# Patient Record
Sex: Male | Born: 1955
Health system: Southern US, Community
[De-identification: ages and names within clinical notes are randomized; demographics above are authoritative.]

## PROBLEM LIST (undated history)

## (undated) DIAGNOSIS — R7303 Prediabetes: Secondary | ICD-10-CM

## (undated) DIAGNOSIS — K635 Polyp of colon: Secondary | ICD-10-CM

## (undated) DIAGNOSIS — F419 Anxiety disorder, unspecified: Secondary | ICD-10-CM

## (undated) DIAGNOSIS — E790 Hyperuricemia without signs of inflammatory arthritis and tophaceous disease: Secondary | ICD-10-CM

## (undated) DIAGNOSIS — E785 Hyperlipidemia, unspecified: Secondary | ICD-10-CM

## (undated) DIAGNOSIS — I10 Essential (primary) hypertension: Secondary | ICD-10-CM

## (undated) DIAGNOSIS — C61 Malignant neoplasm of prostate: Secondary | ICD-10-CM

## (undated) DIAGNOSIS — R351 Nocturia: Secondary | ICD-10-CM

## (undated) HISTORY — DX: Hyperlipidemia, unspecified: E78.5

## (undated) HISTORY — DX: Hyperuricemia without signs of inflammatory arthritis and tophaceous disease: E79.0

## (undated) HISTORY — DX: Polyp of colon: K63.5

## (undated) HISTORY — DX: Anxiety disorder, unspecified: F41.9

---

## 2006-02-10 LAB — HM COLONOSCOPY

## 2011-03-05 ENCOUNTER — Ambulatory Visit (INDEPENDENT_AMBULATORY_CARE_PROVIDER_SITE_OTHER): Payer: BC Managed Care – PPO | Admitting: Family Medicine

## 2011-03-05 ENCOUNTER — Encounter: Payer: Self-pay | Admitting: Family Medicine

## 2011-03-05 VITALS — BP 152/100 | HR 88 | Ht 70.0 in | Wt 179.0 lb

## 2011-03-05 DIAGNOSIS — E7800 Pure hypercholesterolemia, unspecified: Secondary | ICD-10-CM

## 2011-03-05 DIAGNOSIS — B354 Tinea corporis: Secondary | ICD-10-CM

## 2011-03-05 DIAGNOSIS — D126 Benign neoplasm of colon, unspecified: Secondary | ICD-10-CM

## 2011-03-05 DIAGNOSIS — Z0000 Encounter for general adult medical examination without abnormal findings: Secondary | ICD-10-CM

## 2011-03-05 DIAGNOSIS — R030 Elevated blood-pressure reading, without diagnosis of hypertension: Secondary | ICD-10-CM

## 2011-03-05 LAB — COMPREHENSIVE METABOLIC PANEL
ALT: 39 U/L (ref 0–53)
Albumin: 4.9 g/dL (ref 3.5–5.2)
CO2: 25 mEq/L (ref 19–32)
Calcium: 9.9 mg/dL (ref 8.4–10.5)
Chloride: 101 mEq/L (ref 96–112)
Sodium: 141 mEq/L (ref 135–145)
Total Protein: 7.3 g/dL (ref 6.0–8.3)

## 2011-03-05 LAB — CBC
Platelets: 285 10*3/uL (ref 150–400)
RDW: 12.8 % (ref 11.5–15.5)
WBC: 8.1 10*3/uL (ref 4.0–10.5)

## 2011-03-05 LAB — POCT URINALYSIS DIPSTICK
Glucose, UA: NEGATIVE
Nitrite, UA: NEGATIVE
Urobilinogen, UA: NEGATIVE

## 2011-03-05 LAB — LIPID PANEL
LDL Cholesterol: 204 mg/dL — ABNORMAL HIGH (ref 0–99)
Triglycerides: 139 mg/dL (ref <150)

## 2011-03-05 NOTE — Patient Instructions (Signed)
Ringworm - Body (Tinea Corporis) Ringworm is a fungal infection of the skin and hair. Another name for this problem is Tinea Corporis. It has nothing to do with worms. A fungus is an organism that lives on dead cells (the outer layer of skin). It can involve the entire body. It can spread from infected pets. Tinea corporis can be a problem in wrestlers who may get the infection form other players/opponents, equipment and mats. DIAGNOSIS A skin scraping can be obtained from the affected area and by looking for fungus under the microscope. This is called a KOH examination.  HOME CARE INSTRUCTIONS  Ringworm may be treated with a topical antifungal cream, ointment, or oral medications.   If you are using a cream or ointment, wash infected skin. Dry it completely before application.   Scrub the skin with a buff puff or abrasive sponge using a shampoo with ketoconazole to remove dead skin and help treat the ringworm.   Have your pet treated by your veterinarian if it has the same infection.  SEEK MEDICAL CARE IF:  The ringworm patch (fungus) continues to spread after 7 days of treatment.   The rash is not gone in 4 weeks. Fungal infections are slow to respond to treatment. Some redness (erythema) may remain for several weeks after the fungus is gone.   The area becomes red, warm, tender, and swollen beyond the patch. This may be a secondary bacterial (germ) infection.   An oral temperature above 101 develops.

## 2011-03-05 NOTE — Progress Notes (Signed)
Jeremy Bush is a 55 y.o. male who presents for a complete physical.  He has the following concerns: cholesterol.  He has a h/o high cholesterol, was started on Zocor 20mg  in 2007.  Took it until it ran out, and never followed up.  Denies side effects.  Following a low cholesterol diet.  Admits to a very high stress job.  Drinks about 3 glass of wine every day.  Gets to the [employer] sporadically (weekends), no exercise during the week.  H/o elevated PSA at last CPE in '07. Sounds like he was treated for a prostatitis by Dr. Wanda Plump and hasn't had any symptoms.  Hasn't had PSA checked since that time.  Denies urinary symptoms.   Immunization History  Administered Date(s) Administered  . Td 09/15/2005   Last colonoscopy: 2007, just received notice that he is due again Last PSA:  2/07, 1.91.  Had elevated PSA, was referred to Dr. Wanda Plump.  Treated with a medication (?antibiotic), and PSA improved.  Not checked since ophtho annually, dentist 3x/year  The patient denies anorexia, fever, weight changes, headaches,  vision loss, decreased hearing, ear pain, hoarseness, chest pain, palpitations, dizziness, syncope, dyspnea on exertion, cough, swelling, nausea, vomiting, diarrhea, constipation, abdominal pain, melena, hematochezia, indigestion/heartburn, hematuria, incontinence, erectile dysfunction, nocturia, weakened urine stream, dysuria, genital lesions, joint pains, numbness, tingling, weakness, tremor, suspicious skin lesions, depression, anxiety, abnormal bleeding/bruising, or enlarged lymph nodes  ROS notable for mild sore throat x few days, up once a night to bathroom. Red spots on back of right thigh--noticed about a month ago.  Not itchy.  Has a cat  Past Medical History  Diagnosis Date  . Hyperlipidemia   . Colonic polyp 2007    History reviewed. No pertinent past surgical history.  History   Social History  . Marital Status: Married    Spouse Name: N/A    Number of  Children: N/A  . Years of Education: N/A   Occupational History  . Not on file.   Social History Main Topics  . Smoking status: Former Smoker    Types: Cigarettes    Quit date: 10/27/1978  . Smokeless tobacco: Not on file  . Alcohol Use: 12.6 oz/week    21 Glasses of wine per week     daily 2-3 drinks per day prior to dinner  . Drug Use: No  . Sexually Active: Yes   Other Topics Concern  . Not on file   Social History Narrative  . No narrative on file    Family History  Problem Relation Age of Onset  . Cancer Mother     lung (smoker)  . Hypertension Mother   . Hyperlipidemia Mother   . Stroke Father     mini-stroke  . Hypertension Father   . Kidney disease Father     on dialysis  . Diabetes Father     Current outpatient prescriptions:chlorpheniramine-pseudoephedrine-acetaminophen (SINUTAB) 2-30-500 MG per tablet, Take 1 tablet by mouth as needed.  , Disp: , Rfl: ;  Doxylamine Succinate, Sleep, (UNISOM PO), Take 1 capsule by mouth.  , Disp: , Rfl:   No Known Allergies  Physical Exam: BP 152/100  Pulse 88  Ht 5\' 10"  (1.778 m)  Wt 179 lb (81.194 kg)  BMI 25.68 kg/m2  General Appearance:    Alert, cooperative, no distress, appears stated age  Head:    Normocephalic, without obvious abnormality, atraumatic  Eyes:    PERRL, conjunctiva/corneas clear, EOM's intact, fundi    benign  Ears:  Normal TM's and external ear canals  Nose:   Nares normal, mucosa normal, no drainage or sinus   tenderness  Throat:   Lips, mucosa, and tongue normal; teeth and gums normal  Neck:   Supple, no lymphadenopathy;  thyroid:  no   enlargement/tenderness/nodules; no carotid   bruit or JVD  Back:    Spine nontender, no curvature, ROM normal, no CVA     tenderness  Lungs:     Clear to auscultation bilaterally without wheezes, rales or     ronchi; respirations unlabored  Chest Wall:    No tenderness or deformity   Heart:    Regular rate and rhythm, S1 and S2 normal, no murmur, rub    or gallop  Breast Exam:    No chest wall tenderness, masses or gynecomastia  Abdomen:     Soft, non-tender, nondistended, normoactive bowel sounds,    no masses, no hepatosplenomegaly  Genitalia:    Normal male external genitalia without lesions.  Testicles without masses.  No inguinal hernias.  Rectal:    Normal sphincter tone, no masses or tenderness; guaiac negative stool.  Prostate smooth, no nodules, not enlarged.  Extremities:   No clubbing, cyanosis or edema  Pulses:   2+ and symmetric all extremities  Skin:   Skin color, texture, turgor normal, R buttock has circular lesions x 2, raised edges with clear center  Lymph nodes:   Cervical, supraclavicular, and axillary nodes normal  Neurologic:   CNII-XII intact, normal strength, sensation and gait; reflexes 2+ and symmetric throughout          Psych:   Normal mood, affect, hygiene and grooming.    1. Routine general medical examination at a health care facility  POCT urinalysis dipstick, Comprehensive metabolic panel, CBC, PSA  2. Pure hypercholesterolemia  Lipid panel  3. Elevated BP  Comprehensive metabolic panel  4. Tinea corporis     discussed use of OTC antifungal (such as clotrimazole cream) twice daily for up to 2-3 weeks.  Follow up if not improving   Discussed PSA screening (risks/benefits), recommended at least 30 minutes of aerobic activity at least 5 days/week; proper sunscreen use reviewed; healthy diet and alcohol recommendations (less than or equal to 2 drinks/day) reviewed; regular seatbelt use; changing batteries in smoke detectors. Self-testicular exams. Immunization recommendations discussed.  Colonoscopy recommendations reviewed.

## 2011-03-06 ENCOUNTER — Telehealth: Payer: Self-pay | Admitting: *Deleted

## 2011-03-06 NOTE — Telephone Encounter (Signed)
Message copied by Debbrah Alar on Thu Mar 06, 2011 10:52 AM ------      Message from: KNAPP, EVE      Created: Thu Mar 06, 2011  9:33 AM       Advise patient of lipid results--higher than last time (last LDL before meds was 190).  I recommend starting 20mg  Lipitor (rather than the Zocor that he was put on last time).  We need to recheck LFTs and lipid panel in 2 months, and may need to adjust the dose based on those values (dx 272.0, v58.69).  Rx for the generic #30 with 2 refills and schedule lab visit.  Chem panel normal except sugar was 102, normal <100.  Daily exercise will help keep sugars down. CBC okay. PSA was slightly elevated at 4.63.  I recommend that he return for a FREE PSA. If he is unable to return for this lab in the near future, then we can do it in 2 months with his lipids. ( Diagnosis is elevated PSA)

## 2011-03-06 NOTE — Telephone Encounter (Signed)
Left message for patient to return my call re:lab results

## 2011-03-10 ENCOUNTER — Other Ambulatory Visit: Payer: Self-pay | Admitting: *Deleted

## 2011-03-10 DIAGNOSIS — E785 Hyperlipidemia, unspecified: Secondary | ICD-10-CM

## 2011-03-10 MED ORDER — ATORVASTATIN CALCIUM 20 MG PO TABS: 20.0000 mg | ORAL_TABLET | Freq: Every day | ORAL | Status: DC

## 2011-03-10 NOTE — Telephone Encounter (Signed)
Left message for pateint to return my call [id number]

## 2011-03-14 ENCOUNTER — Telehealth: Payer: Self-pay | Admitting: Family Medicine

## 2011-03-14 NOTE — Telephone Encounter (Signed)
Pt started Lipitor on Monday, takes in evenings, causing fast pulse, can't sleep, upset stomach, dull aching lower back. Just feels bad

## 2011-03-14 NOTE — Telephone Encounter (Signed)
jcl had pt to stop med over the weekend and call Monday to discuss with Dr.knapp

## 2011-03-14 NOTE — Telephone Encounter (Signed)
Have him stop the Lipitor through the weekend and call on Monday to discuss this further with Dr. Lynelle Doctor

## 2011-04-17 ENCOUNTER — Encounter: Payer: Self-pay | Admitting: Family Medicine

## 2011-05-13 ENCOUNTER — Encounter: Payer: Self-pay | Admitting: Family Medicine

## 2011-05-14 ENCOUNTER — Ambulatory Visit: Payer: BC Managed Care – PPO

## 2011-05-21 ENCOUNTER — Ambulatory Visit: Payer: BC Managed Care – PPO

## 2011-05-21 DIAGNOSIS — Z79899 Other long term (current) drug therapy: Secondary | ICD-10-CM

## 2011-05-21 DIAGNOSIS — R972 Elevated prostate specific antigen [PSA]: Secondary | ICD-10-CM

## 2011-05-21 DIAGNOSIS — E7800 Pure hypercholesterolemia, unspecified: Secondary | ICD-10-CM

## 2011-05-21 LAB — LIPID PANEL
Cholesterol: 300 mg/dL — ABNORMAL HIGH (ref 0–200)
HDL: 61 mg/dL (ref >39)
Total CHOL/HDL Ratio: 4.9 Ratio
VLDL: 25 mg/dL (ref 0–40)

## 2011-05-21 LAB — HEPATIC FUNCTION PANEL
Albumin: 4.5 g/dL (ref 3.5–5.2)
Alkaline Phosphatase: 63 U/L (ref 39–117)
Bilirubin, Direct: 0.2 mg/dL (ref 0.0–0.3)
Total Bilirubin: 1 mg/dL (ref 0.3–1.2)

## 2011-05-22 ENCOUNTER — Telehealth: Payer: Self-pay

## 2011-05-22 LAB — PSA, TOTAL AND FREE: PSA: 3.51 ng/mL (ref <=4.00)

## 2011-05-22 NOTE — Telephone Encounter (Signed)
Called pt left message for him to call and make appt to see Dr.Knapp pt agreed and made appt

## 2011-05-23 NOTE — Progress Notes (Signed)
dt ?

## 2011-05-28 ENCOUNTER — Encounter: Payer: Self-pay | Admitting: Family Medicine

## 2011-05-28 ENCOUNTER — Ambulatory Visit (INDEPENDENT_AMBULATORY_CARE_PROVIDER_SITE_OTHER): Payer: BC Managed Care – PPO | Admitting: Family Medicine

## 2011-05-28 DIAGNOSIS — E7800 Pure hypercholesterolemia, unspecified: Secondary | ICD-10-CM

## 2011-05-28 DIAGNOSIS — R972 Elevated prostate specific antigen [PSA]: Secondary | ICD-10-CM

## 2011-05-28 DIAGNOSIS — I10 Essential (primary) hypertension: Secondary | ICD-10-CM | POA: Insufficient documentation

## 2011-05-28 MED ORDER — LISINOPRIL-HYDROCHLOROTHIAZIDE 10-12.5 MG PO TABS: 1.0000 | ORAL_TABLET | Freq: Every day | ORAL | Status: DC

## 2011-05-28 NOTE — Patient Instructions (Addendum)
Please CALL if you aren't tolerating the Lipitor, so we can change you to Crestor. Continue to monitor blood pressure. If BP drops below 100/60, or if you're feeling dizzy, then cut medication in half. Please fax blood pressure list in 2-3 weeks.    Try clotrimazole if your current antifungal isn't effective (OTC)

## 2011-05-28 NOTE — Progress Notes (Signed)
Patient presents for f/u hyperlipidemia.  He was started on Lipitor 20mg .  He noticed side effects after about the third day--had palpitations, and woke him up from sleeping.  Never tried taking it during the day. He called here, was told to stop med, and call back to speak with me, but he never called back.  He continues to try and follow a low cholesterol diet.  He is also here to f/u on prostate labs.  His PSA was >4 at last check, so it was repeated with free PSA.  Repeat was <4 (see lab results).  Denies any prostate symptoms currently.  He has a h/o prostatitis, treated with ABX, and was under the care of urologist.  Follow up elevated blood pressure.  He has been checking BP at pharmacies and at parent's house (forgot to bring list).  On average BP's high 130's/high 90's. Only time he gets a good reading is after exercise, but higher in the evenings.  120/80 after a vacation, but recurred up to 130's/97 after a few days back at work. Exercises most days of the week, and follows low salt diet.  Past Medical History  Diagnosis Date  . Hyperlipidemia   . Colonic polyp 2007    History reviewed. No pertinent past surgical history.  History   Social History  . Marital Status: Married    Spouse Name: N/A    Number of Children: N/A  . Years of Education: N/A   Occupational History  . Not on file.   Social History Main Topics  . Smoking status: Former Smoker    Types: Cigarettes    Quit date: 10/27/1978  . Smokeless tobacco: Not on file  . Alcohol Use: 12.6 oz/week    21 Glasses of wine per week     daily 2-3 drinks per day prior to dinner  . Drug Use: No  . Sexually Active: Yes     Married with 3 children   Other Topics Concern  . Not on file   Social History Narrative  . No narrative on file    Family History  Problem Relation Age of Onset  . Cancer Mother     lung (smoker)  . Hypertension Mother   . Hyperlipidemia Mother   . Stroke Father     mini-stroke  .  Hypertension Father   . Kidney disease Father     on dialysis  . Diabetes Father    Current Outpatient Prescriptions on File Prior to Visit  Medication Sig Dispense Refill  . chlorpheniramine-pseudoephedrine-acetaminophen (SINUTAB) 2-30-500 MG per tablet Take 1 tablet by mouth as needed.        . Doxylamine Succinate, Sleep, (UNISOM PO) Take 1 capsule by mouth.        Marland Kitchen atorvastatin (LIPITOR) 20 MG tablet Take 1 tablet (20 mg total) by mouth daily.  30 tablet  2  (not taking Lipitor)  No Known Allergies  ROS:  Denies fevers, headaches, chest pain, SOB, URI symptoms, urinary symptoms, myalgias, arthralgias, GI complaints, or other problems. He states that ringworm on thigh resolved with OTC antifungal (generic Tinactin, I believe), but has noticed areas on buttocks--seem to be responding to use of antifungal (been using x 2 weeks)  PHYSICAL EXAM: BP 160/110  Pulse 68  Ht 5\' 10"  (1.778 m)  Wt 175 lb (79.379 kg)  BMI 25.11 kg/m2 Well developed, pleasant, non-anxious appearing male, in no distress Neck: no lymphadenopathy thyromegaly or mass Heart: regular rate and rhythm without murmur Lungs: clear  bilaterally Extremities: no edema Psych: normal mood ,affect, hygiene and grooming  ASSESSMENT/PLAN: 1. Pure hypercholesterolemia  Lipid panel, Hepatic function panel  2. Abnormal PSA  PSA, total and free  3. Essential hypertension, benign  lisinopril-hydrochlorothiazide (ZESTORETIC) 10-12.5 MG per tablet   Discussed re-trying Lipitor during the day (can start at 1/2 tablet for a few days if he wants).  If doesn't tolerate, call and change to Crestor (20mg )  Discussed 27% risk of cancer based on free PSA. H/o elevated PSA due to prostatitis.  Has seen urologist in past, PSA decreased to <2 with prolonged ABX.  Offered referral back to urologist, vs re-check PSA (and free PSA) in 6 months.  Prefers to re-check in 6 months, but will follow up if develops any prostate symptoms (ie  prostatitis)  HTN:  Partly related to stressful job.  Continue with exercise, low sodium diet.  Understands need to start meds.  Risks and side effects of ACEI reviewed.  Continue to check BP's elsewhere, and pt to fax BP results in 2-3 weeks

## 2011-07-09 ENCOUNTER — Other Ambulatory Visit: Payer: BC Managed Care – PPO

## 2011-07-10 ENCOUNTER — Other Ambulatory Visit: Payer: BC Managed Care – PPO

## 2011-07-11 ENCOUNTER — Other Ambulatory Visit: Payer: BC Managed Care – PPO

## 2011-07-11 DIAGNOSIS — R972 Elevated prostate specific antigen [PSA]: Secondary | ICD-10-CM

## 2011-07-11 DIAGNOSIS — E7800 Pure hypercholesterolemia, unspecified: Secondary | ICD-10-CM

## 2011-07-11 DIAGNOSIS — I10 Essential (primary) hypertension: Secondary | ICD-10-CM

## 2011-07-11 LAB — COMPREHENSIVE METABOLIC PANEL
ALT: 57 U/L — ABNORMAL HIGH (ref 0–53)
AST: 31 U/L (ref 0–37)
Albumin: 4.1 g/dL (ref 3.5–5.2)
Alkaline Phosphatase: 62 U/L (ref 39–117)
BUN: 15 mg/dL (ref 6–23)
Calcium: 9.2 mg/dL (ref 8.4–10.5)
Chloride: 105 mEq/L (ref 96–112)
Potassium: 4.3 mEq/L (ref 3.5–5.3)
Sodium: 141 mEq/L (ref 135–145)
Total Protein: 6.5 g/dL (ref 6.0–8.3)

## 2011-07-11 LAB — LIPID PANEL
Cholesterol: 203 mg/dL — ABNORMAL HIGH (ref 0–200)
Total CHOL/HDL Ratio: 3.9 Ratio

## 2011-07-11 LAB — PSA, TOTAL AND FREE
PSA, Free Pct: 17 % — ABNORMAL LOW (ref >25)
PSA, Free: 0.64 ng/mL
PSA: 3.66 ng/mL (ref <=4.00)

## 2011-07-16 ENCOUNTER — Other Ambulatory Visit: Payer: Self-pay | Admitting: *Deleted

## 2011-07-16 ENCOUNTER — Telehealth: Payer: Self-pay | Admitting: *Deleted

## 2011-07-16 DIAGNOSIS — R972 Elevated prostate specific antigen [PSA]: Secondary | ICD-10-CM

## 2011-07-16 DIAGNOSIS — Z79899 Other long term (current) drug therapy: Secondary | ICD-10-CM

## 2011-07-16 NOTE — Telephone Encounter (Signed)
Spoke with patient, lab results given. Patient to continue on lipitor and low cholesterol diet. Patient will send Dr.Knapp blood pressure readings in the next 2 weeks. He did want Dr.Knapp to know that in the am before he takes his meds his readings are 130/90, then in the afternoon/early evening his readings are 110/70 and the highest 120/80, but he will do some logs and send. He is scheduled for January 12, 2012 @ 8:30am for PSA, Free PSA, Lipids and CMET.

## 2011-07-22 ENCOUNTER — Ambulatory Visit (INDEPENDENT_AMBULATORY_CARE_PROVIDER_SITE_OTHER): Payer: BC Managed Care – PPO | Admitting: Family Medicine

## 2011-07-22 VITALS — BP 126/90 | HR 96 | Wt 185.0 lb

## 2011-07-22 DIAGNOSIS — L309 Dermatitis, unspecified: Secondary | ICD-10-CM

## 2011-07-22 DIAGNOSIS — Z23 Encounter for immunization: Secondary | ICD-10-CM

## 2011-07-22 DIAGNOSIS — L259 Unspecified contact dermatitis, unspecified cause: Secondary | ICD-10-CM

## 2011-07-22 NOTE — Progress Notes (Signed)
  Subjective:    Patient ID: Jeremy Bush, male    DOB: 1956/02/01, 55 y.o.   MRN: [id number]  HPI He is here for evaluation of a rash that occurred in the buttock area. It is not pruritic, warm or tender. He has tried an antifungal medicine without success. He does not noticed it anywhere also on his body. He has not been exposed to any new soaps detergents or chemicals.   Review of Systems     Objective:   Physical Exam Alert and in no distress. Reddish brown lesions noted in the buttock area and somewhat of a symmetric type pattern      Assessment & Plan:  Dermatitis NOS Refer to dermatology. I will also give a flu shot.

## 2011-08-29 ENCOUNTER — Other Ambulatory Visit: Payer: Self-pay | Admitting: Family Medicine

## 2011-11-13 ENCOUNTER — Telehealth: Payer: Self-pay | Admitting: Family Medicine

## 2011-11-13 NOTE — Telephone Encounter (Signed)
FEE PAID 

## 2011-11-20 ENCOUNTER — Telehealth: Payer: Self-pay | Admitting: *Deleted

## 2011-11-20 NOTE — Telephone Encounter (Signed)
Called pt to let him know that kidney function slightly higher now than in Sept, encouraged him to drink plenty of fluids. Lipids are fine, liver tests slightly elevated but stable. Pt is scheduled for lab recheck in March, reminded him about appt.

## 2011-12-02 ENCOUNTER — Other Ambulatory Visit: Payer: BC Managed Care – PPO

## 2011-12-05 ENCOUNTER — Telehealth: Payer: Self-pay | Admitting: Family Medicine

## 2011-12-05 MED ORDER — ATORVASTATIN CALCIUM 20 MG PO TABS: 20.0000 mg | ORAL_TABLET | Freq: Every day | ORAL | Status: DC

## 2011-12-05 MED ORDER — LISINOPRIL-HYDROCHLOROTHIAZIDE 10-12.5 MG PO TABS: 1.0000 | ORAL_TABLET | Freq: Every day | ORAL | Status: DC

## 2011-12-05 NOTE — Telephone Encounter (Signed)
Sent med in 

## 2012-01-02 ENCOUNTER — Encounter: Payer: Self-pay | Admitting: Internal Medicine

## 2012-01-12 ENCOUNTER — Other Ambulatory Visit: Payer: BC Managed Care – PPO

## 2012-01-12 DIAGNOSIS — Z0279 Encounter for issue of other medical certificate: Secondary | ICD-10-CM

## 2012-01-12 DIAGNOSIS — Z79899 Other long term (current) drug therapy: Secondary | ICD-10-CM

## 2012-01-12 DIAGNOSIS — R972 Elevated prostate specific antigen [PSA]: Secondary | ICD-10-CM

## 2012-01-12 LAB — COMPREHENSIVE METABOLIC PANEL
Albumin: 4.5 g/dL (ref 3.5–5.2)
CO2: 27 mEq/L (ref 19–32)
Calcium: 9.6 mg/dL (ref 8.4–10.5)
Chloride: 99 mEq/L (ref 96–112)
Glucose, Bld: 100 mg/dL — ABNORMAL HIGH (ref 70–99)
Potassium: 4.5 mEq/L (ref 3.5–5.3)
Sodium: 137 mEq/L (ref 135–145)
Total Protein: 6.8 g/dL (ref 6.0–8.3)

## 2012-01-12 LAB — LIPID PANEL
Cholesterol: 217 mg/dL — ABNORMAL HIGH (ref 0–200)
LDL Cholesterol: 126 mg/dL — ABNORMAL HIGH (ref 0–99)
Triglycerides: 150 mg/dL — ABNORMAL HIGH (ref <150)

## 2012-01-19 ENCOUNTER — Encounter: Payer: Self-pay | Admitting: Family Medicine

## 2012-01-19 ENCOUNTER — Ambulatory Visit (INDEPENDENT_AMBULATORY_CARE_PROVIDER_SITE_OTHER): Payer: BC Managed Care – PPO | Admitting: Family Medicine

## 2012-01-19 VITALS — BP 138/92 | HR 72 | Ht 70.0 in | Wt 182.0 lb

## 2012-01-19 DIAGNOSIS — I10 Essential (primary) hypertension: Secondary | ICD-10-CM

## 2012-01-19 DIAGNOSIS — R972 Elevated prostate specific antigen [PSA]: Secondary | ICD-10-CM

## 2012-01-19 DIAGNOSIS — E7800 Pure hypercholesterolemia, unspecified: Secondary | ICD-10-CM

## 2012-01-19 NOTE — Patient Instructions (Signed)
Nurse visit to verify accuracy of machine. Continued low sodium diet, regular exercise. If diastolic BP truly remains >85 consistently, then try changing med dose to 20/12.5.  If mostly <85 (and often <80), then no change is needed.  Elevated PSA--please schedule follow up with [facility] (previously saw Dr. Boston Service)  Cholesterol is doing well on current medications.  Continue the same meds.

## 2012-01-19 NOTE — Progress Notes (Signed)
Patient presents for 6 month follow up on hypertension and hyperlipidemia.  Hypertension follow-up:  Blood pressures was 131/89 at home last night.  Diastolic is usually right around 90 (87-91).  Denies dizziness, headaches, chest pain, cough (only minor).  Denies side effects of medications. Exercise routine irregular due to recent travel (business trips).  He recalls that his BP's are much lower when he is on his regular exercise routine (120's/high 70's-low 80's).  Hyperlipidemia follow-up:  Patient is reportedly following a low-fat, low cholesterol diet--admits it is worse recently due to a lot of business travel.  Compliant with medications and denies medication side effects  Elevated PSA.  Previously saw Dr. Wanda Plump.  Slight diminished stream, mainly if sitting all day.  Up once a night to void.  H/o elevated PSA in past, treated for prostatitis and PSA's went back down.    Past Medical History  Diagnosis Date  . Hyperlipidemia   . Colonic polyp 2007    History reviewed. No pertinent past surgical history.  History   Social History  . Marital Status: Married    Spouse Name: N/A    Number of Children: N/A  . Years of Education: N/A   Occupational History  . Not on file.   Social History Main Topics  . Smoking status: Former Smoker    Types: Cigarettes    Quit date: 10/27/1978  . Smokeless tobacco: Not on file  . Alcohol Use: 12.6 oz/week    21 Glasses of wine per week     daily 2-3 drinks per day prior to dinner  . Drug Use: No  . Sexually Active: Yes     Married with 3 children   Other Topics Concern  . Not on file   Social History Narrative  . No narrative on file    Family History  Problem Relation Age of Onset  . Cancer Mother     lung (smoker)  . Hypertension Mother   . Hyperlipidemia Mother   . Stroke Father     mini-stroke  . Hypertension Father   . Kidney disease Father     on dialysis  . Diabetes Father    Current Outpatient Prescriptions on  File Prior to Visit  Medication Sig Dispense Refill  . atorvastatin (LIPITOR) 20 MG tablet Take 1 tablet (20 mg total) by mouth daily.  30 tablet  3  . Doxylamine Succinate, Sleep, (UNISOM PO) Take 1 capsule by mouth.        Marland Kitchen lisinopril-hydrochlorothiazide (PRINZIDE,ZESTORETIC) 10-12.5 MG per tablet Take 1 tablet by mouth daily.  30 tablet  prn    No Known Allergies  ROS:  Denies fevers, allergies, URI symptoms, cough, shortness of breath, chest pain, edema, skin rashes, nausea, vomiting, bowel changes, dysuria.   PHYSICAL EXAM: BP 138/92  Pulse 72  Ht 5\' 10"  (1.778 m)  Wt 182 lb (82.555 kg)  BMI 26.11 kg/m2 138/92 Well developed, pleasant, non-anxious appearing male, in no distress  Neck: no lymphadenopathy thyromegaly or mass  Heart: regular rate and rhythm without murmur  Lungs: clear bilaterally  Extremities: no edema  Psych: normal mood ,affect, hygiene and grooming  Lab Results  Component Value Date   CHOL 217* 01/12/2012   HDL 61 01/12/2012   LDLCALC 126* 01/12/2012   TRIG 150* 01/12/2012   CHOLHDL 3.6 01/12/2012     Chemistry      Component Value Date/Time   NA 137 01/12/2012 0842   K 4.5 01/12/2012 0842   CL 99 01/12/2012  0842   CO2 27 01/12/2012 0842   BUN 17 01/12/2012 0842   CREATININE 1.17 01/12/2012 0842      Component Value Date/Time   CALCIUM 9.6 01/12/2012 0842   ALKPHOS 59 01/12/2012 0842   AST 22 01/12/2012 0842   ALT 34 01/12/2012 0842   BILITOT 0.8 01/12/2012 0842     Lab Results  Component Value Date   PSA 4.52* 01/12/2012   PSA 3.66 07/11/2011   PSA 3.51 05/21/2011    ASSESSMENT/PLAN: 1. Essential hypertension, benign   2. Pure hypercholesterolemia   3. Elevated PSA     Nurse visit to verify accuracy of machine. Continued low sodium diet, regular exercise. Goal BP's reviewed. If diastolic BP truly remains >85 consistently, then try changing med dose to 20/12.5.  If mostly <85 (and often <80), then no change is needed.  Elevated PSA--please  schedule follow up with [facility] (previously saw Dr. Boston Service).  Reviewed past results and free PSA results in detail.  Recommend consultation with urologist to determine if further work-up/eval/u/s is recommended versus continued observation.  Briefly reviewed risks/benefits of PSA screening and potential courses of prostate cancer.  Hyperlipidemia--well controlled.  Continue atorvastatin.  Colon polyps--due in the next month or two for repeat colonoscopy.  He plans to schedule soon (they have contacted him, changing insurance)

## 2012-02-19 ENCOUNTER — Other Ambulatory Visit: Payer: BC Managed Care – PPO

## 2012-02-25 ENCOUNTER — Telehealth: Payer: Self-pay | Admitting: *Deleted

## 2012-02-25 NOTE — Telephone Encounter (Signed)
Called patient and went over Dr.Knapp's recommendations. She said that his BP readings overall are fine, as long as he was feeling fine, which he stated that he was-then there would be no change in medications.

## 2012-03-24 DIAGNOSIS — C61 Malignant neoplasm of prostate: Secondary | ICD-10-CM | POA: Insufficient documentation

## 2012-03-24 HISTORY — PX: BIOPSY PROSTATE: PRO28

## 2012-03-24 HISTORY — DX: Malignant neoplasm of prostate: C61

## 2012-04-01 ENCOUNTER — Encounter: Payer: Self-pay | Admitting: Family Medicine

## 2012-04-01 DIAGNOSIS — C61 Malignant neoplasm of prostate: Secondary | ICD-10-CM | POA: Insufficient documentation

## 2012-04-04 ENCOUNTER — Other Ambulatory Visit: Payer: Self-pay | Admitting: Family Medicine

## 2012-04-22 ENCOUNTER — Encounter: Payer: Self-pay | Admitting: Radiation Oncology

## 2012-04-22 NOTE — Progress Notes (Signed)
Newly diagnosed adenocarcinoma of the prostate. 03/11/12 PSA 4.05 Gleason 6 in 6/6 lobes

## 2012-04-26 ENCOUNTER — Ambulatory Visit: Payer: BC Managed Care – PPO | Admitting: Radiation Oncology

## 2012-04-26 ENCOUNTER — Ambulatory Visit: Payer: BC Managed Care – PPO

## 2012-05-10 ENCOUNTER — Encounter: Payer: Self-pay | Admitting: Radiation Oncology

## 2012-05-10 ENCOUNTER — Ambulatory Visit
Admission: RE | Admit: 2012-05-10 | Discharge: 2012-05-10 | Disposition: A | Discharge status: Home / Self Care | Payer: BC Managed Care – PPO | Source: Ambulatory Visit | Attending: Radiation Oncology | Admitting: Radiation Oncology

## 2012-05-10 VITALS — BP 135/93 | HR 96 | Temp 98.1°F | Wt 177.3 lb

## 2012-05-10 DIAGNOSIS — C61 Malignant neoplasm of prostate: Secondary | ICD-10-CM

## 2012-05-10 DIAGNOSIS — E785 Hyperlipidemia, unspecified: Secondary | ICD-10-CM | POA: Insufficient documentation

## 2012-05-10 DIAGNOSIS — Z79899 Other long term (current) drug therapy: Secondary | ICD-10-CM | POA: Insufficient documentation

## 2012-05-10 NOTE — Progress Notes (Signed)
Formal radiation consultation for diagnosis of prostate cancer.No family history of prostate cancer.Married with 3 children.( 2 natural). Patient has been relatively healthy except hypertension and hypercholesterolemia.No surgical history.

## 2012-05-10 NOTE — Progress Notes (Signed)
Please see the Nurse Progress Note in the MD Initial Consult Encounter for this patient. 

## 2012-05-13 ENCOUNTER — Encounter: Payer: Self-pay | Admitting: Radiation Oncology

## 2012-05-13 NOTE — Progress Notes (Addendum)
Radiation Oncology         [phone]) [phone] ________________________________  Initial outpatient Consultation  Name: Jeremy Bush MRN: [id number]  Date: 05/10/2012  DOB: November 29, 1955  WJ:XBJYN,WGN A, MD  Garnett Farm, MD   REFERRING PHYSICIAN: Garnett Farm, MD  DIAGNOSIS: 56 year old gentleman with stage T1c adenocarcinoma of the prostate with a Gleason's score of 3+3 and a PSA of 4.05  HISTORY OF PRESENT ILLNESS::Jeremy Bush is a 56 y.o. gentleman.  He was originally noted to have an elevated PSA in 2007 by Dr. Boston Service as high as 10.  He received antibiotic therapy for presumed prostatitis. His PSA decreased to 6.4 initially and after 6 weeks of antibiotic therapy dropped to 1.91. PSA has gradually increased to 3.51 in July 2012. Short interval followup in September of 2012 increased further to 3.66. By March of 2013, PSA became above normal at 4.52. The patient was referred for urology evaluation at that time. He met with Dr. Vernie Ammons on April 15. Digital rectal exam at that time revealed a 1+ prostate with no nodules. The patient proceeded to transrectal ultrasound with 12 biopsies of the prostate on may 29th.  The prostate volume measured 31.3  cc.  Out of 12 core biopsies,6  were positive.  The maximum Gleason score was 3+3, and this was seen in 10% of the right lateral base, 15% of the right lateral apex, 20% of the right apex, 20% left lateral base, 15% of the left mid, and less than 5% of the left apex.  The patient reviewed the biopsy results with his urologist and he has kindly been referred today for discussion of potential radiation treatment options. Marland Kitchen  PREVIOUS RADIATION THERAPY: No  PAST MEDICAL HISTORY:  has a past medical history of Hyperlipidemia and Colonic polyp (2007).    PAST SURGICAL HISTORY: Past Surgical History  Procedure Date  . Biopsy prostate 03/24/12    gleason 3+3=6/prostate volume 31 cc's    FAMILY HISTORY: family history  includes Cancer in his mother; Diabetes in his father; Hyperlipidemia in his mother; Hypertension in his father and mother; Kidney disease in his father; and Stroke in his father.  SOCIAL HISTORY:  does not have a smoking history on file. He does not have any smokeless tobacco history on file. He reports that he drinks about 12.6 ounces of alcohol per week. He reports that he does not use illicit drugs.  ALLERGIES: Review of patient's allergies indicates no known allergies.  MEDICATIONS:  Current Outpatient Prescriptions  Medication Sig Dispense Refill  . atorvastatin (LIPITOR) 20 MG tablet TAKE 1 TABLET (20 MG TOTAL) BY MOUTH DAILY.  30 tablet  1  . Doxylamine Succinate, Sleep, (UNISOM PO) Take 1 capsule by mouth.        Marland Kitchen lisinopril-hydrochlorothiazide (PRINZIDE,ZESTORETIC) 10-12.5 MG per tablet Take 1 tablet by mouth daily.  30 tablet  prn    REVIEW OF SYSTEMS:  A 15 point review of systems is documented in the electronic medical record. This was obtained by the nursing staff. However, I reviewed this with the patient to discuss relevant findings and make appropriate changes.  A comprehensive review of systems was negative. the patient did fill out a urinary function questionnaire providing overall score of 6 suggesting mild outflow obstructive symptoms. He also filled out in erectile dysfunction questionnaire indicating limited if any erectile dysfunction.   PHYSICAL EXAM:  weight is 177 lb 4.8 oz (80.423 kg). His temperature is 98.1 F (36.7 C). His blood pressure is 135/93  and his pulse is 96.   The patient is in no acute distress today. Is alert and oriented. Respiratory effort is unremarkable. Neurologically he appears to be grossly intact. Digital rectal exam was deferred today. Please note the findings described by Dr. Vernie Ammons above revealing no nodules within the prostate.  LABORATORY DATA:  Lab Results  Component Value Date   WBC 8.1 03/05/2011   HGB 17.2* 03/05/2011   HCT 50.6  03/05/2011   MCV 91.8 03/05/2011   PLT 285 03/05/2011   Lab Results  Component Value Date   NA 137 01/12/2012   K 4.5 01/12/2012   CL 99 01/12/2012   CO2 27 01/12/2012   Lab Results  Component Value Date   ALT 34 01/12/2012   AST 22 01/12/2012   ALKPHOS 59 01/12/2012   BILITOT 0.8 01/12/2012      IMPRESSION: Mr. Jeremy Bush is a very nice 56 year old gentleman with stage T1c adenocarcinoma of the prostate with a Gleason's score of 3+3 and a PSA of 4.05.  He falls into the favorable risk group in for prostate cancer and is eligible for a variety of potential treatment approaches including active surveillance, robotic-assisted laparoscopic radical prostatectomy, external beam radiation treatment, or prostate brachytherapy as monotherapy.  PLAN:Today I reviewed the findings and workup thus far.  We discussed the natural history of prostate cancer.  We reviewed the the implications of T-stage, Gleason's Score, and PSA on decision-making and outcomes in prostate cancer.  We discussed radiation treatment in the management of prostate cancer with regard to the logistics and delivery of external beam radiation treatment as well as the logistics and delivery of prostate brachytherapy.  We compared and contrasted each of these approaches and also compared these against prostatectomy.  The patient at this point is leaning towards prostate brachytherapy although he has not finalized his decision.    I filled out a patient counseling form for him with relevant treatment diagrams and we retained a copy for our records.   If the patient would like to proceed with prostate brachytherapy I have provided him with contact information for our seed implant scheduler.  I enjoyed meeting with him today, and will look forward to participating in the care of this very nice gentleman.   I spent 60 minutes minutes face to face with the patient and more than 50% of that time was spent in counseling and/or coordination of care.     ------------------------------------------------  Artist Pais. Kathrynn Running, M.D.

## 2012-05-14 NOTE — Addendum Note (Signed)
Encounter addended by: Delynn Flavin, RN on: 05/14/2012  7:21 PM<BR>     Documentation filed: Charges VN

## 2012-05-27 ENCOUNTER — Encounter: Payer: Self-pay | Admitting: Family Medicine

## 2012-05-31 ENCOUNTER — Telehealth: Payer: Self-pay | Admitting: *Deleted

## 2012-05-31 NOTE — Telephone Encounter (Signed)
Pt called asking to speak w/S Lyda Perone

## 2012-05-31 NOTE — Telephone Encounter (Signed)
Pt called stating he wants to begin process for his prostate seed implant. Informed pt that Jacolyn Reedy, medical secretary is out of office x 2 weeks, Dr Kathrynn Running is out of office this week. Pt verbalized understanding, stated he is aware that both Dr Kathrynn Running and urologists' schedules have to be coordinated. He states "Dr Kathrynn Running told me it would probably be later in Sept before I could have the implant". Pt states he is fine with this message being forwarded to Jacolyn Reedy and Dr Kathrynn Running for the implant to be scheduled when Jacolyn Reedy returns to this office.

## 2012-06-08 ENCOUNTER — Other Ambulatory Visit: Payer: Self-pay | Admitting: Family Medicine

## 2012-06-15 ENCOUNTER — Telehealth: Payer: Self-pay | Admitting: *Deleted

## 2012-06-15 NOTE — Telephone Encounter (Signed)
CALLED PATIENT TO INFORM OF PRE-SEED PLANNING CT ON 06-18-12 AT 11:15 AM, LVM FOR A RETURN CALL

## 2012-06-16 ENCOUNTER — Other Ambulatory Visit: Payer: Self-pay | Admitting: Urology

## 2012-06-17 ENCOUNTER — Telehealth: Payer: Self-pay | Admitting: *Deleted

## 2012-06-17 NOTE — Telephone Encounter (Signed)
CALLED PATIENT TO REMIND OF APPT. FOR 06-18-12, CONFIRMED APPT. W/PATIENT

## 2012-06-18 ENCOUNTER — Encounter: Payer: Self-pay | Admitting: Radiation Oncology

## 2012-06-18 ENCOUNTER — Ambulatory Visit (HOSPITAL_BASED_OUTPATIENT_CLINIC_OR_DEPARTMENT_OTHER)
Admission: RE | Admit: 2012-06-18 | Discharge: 2012-06-18 | Disposition: A | Discharge status: Home / Self Care | Payer: BC Managed Care – PPO | Source: Ambulatory Visit | Attending: Urology | Admitting: Urology

## 2012-06-18 ENCOUNTER — Ambulatory Visit
Admission: RE | Admit: 2012-06-18 | Discharge: 2012-06-18 | Disposition: A | Discharge status: Home / Self Care | Payer: BC Managed Care – PPO | Source: Ambulatory Visit | Attending: Radiation Oncology | Admitting: Radiation Oncology

## 2012-06-18 ENCOUNTER — Encounter (HOSPITAL_BASED_OUTPATIENT_CLINIC_OR_DEPARTMENT_OTHER)
Admission: RE | Admit: 2012-06-18 | Discharge: 2012-06-18 | Disposition: A | Discharge status: Home / Self Care | Payer: BC Managed Care – PPO | Source: Ambulatory Visit | Attending: Urology | Admitting: Urology

## 2012-06-18 ENCOUNTER — Other Ambulatory Visit: Payer: Self-pay

## 2012-06-18 DIAGNOSIS — Z01810 Encounter for preprocedural cardiovascular examination: Secondary | ICD-10-CM | POA: Insufficient documentation

## 2012-06-18 DIAGNOSIS — C61 Malignant neoplasm of prostate: Secondary | ICD-10-CM

## 2012-06-18 DIAGNOSIS — Z01812 Encounter for preprocedural laboratory examination: Secondary | ICD-10-CM | POA: Insufficient documentation

## 2012-06-18 NOTE — Progress Notes (Signed)
  Radiation Oncology         [phone]) [phone] ________________________________  Name: Deloy Archey MRN: [id number]  Date: 06/18/2012  DOB: 1956/09/21  SIMULATION AND TREATMENT PLANNING NOTE PUBIC ARCH STUDY  QI:HKVQQ,VZD A, MD  Garnett Farm, MD  DIAGNOSIS: 56 year old gentleman with stage T1c adenocarcinoma of the prostate with a Gleason's score of 3+3 and a PSA of 4.05  COMPLEX SIMULATION:  The patient presented today for evaluation for possible prostate seed implant. He was brought to the radiation planning suite and placed supine on the CT couch. A 3-dimensional image study set was obtained in upload to the planning computer. There, on each axial slice, I contoured the prostate gland. Then, using three-dimensional radiation planning tools I reconstructed the prostate in view of the structures from the transperineal needle pathway to assess for possible pubic arch interference. In doing so, I did not appreciate any pubic arch interference. Also, the patient's prostate volume was estimated based on the drawn structure. The volume was 34 cc.  Given the pubic arch appearance and prostate volume, patient remains a good candidate to proceed with prostate seed implant. Today, he freely provided informed written consent to proceed.    PLAN: The patient will undergo prostate seed implant 145 Gy.   ________________________________  Artist Pais. Kathrynn Running, M.D.

## 2012-07-08 ENCOUNTER — Other Ambulatory Visit: Payer: Self-pay | Admitting: Family Medicine

## 2012-07-19 ENCOUNTER — Ambulatory Visit (INDEPENDENT_AMBULATORY_CARE_PROVIDER_SITE_OTHER): Payer: BC Managed Care – PPO | Admitting: Family Medicine

## 2012-07-19 ENCOUNTER — Encounter: Payer: Self-pay | Admitting: Family Medicine

## 2012-07-19 VITALS — BP 120/80 | HR 72 | Ht 70.0 in | Wt 181.0 lb

## 2012-07-19 DIAGNOSIS — C61 Malignant neoplasm of prostate: Secondary | ICD-10-CM

## 2012-07-19 DIAGNOSIS — I10 Essential (primary) hypertension: Secondary | ICD-10-CM

## 2012-07-19 DIAGNOSIS — E7800 Pure hypercholesterolemia, unspecified: Secondary | ICD-10-CM

## 2012-07-19 MED ORDER — ATORVASTATIN CALCIUM 20 MG PO TABS: 20.0000 mg | ORAL_TABLET | Freq: Every day | ORAL | Status: DC

## 2012-07-19 NOTE — Patient Instructions (Addendum)
Check BP elsewhere.  Follow low sodium diet and exercise regularly.  If BP's are consistently >135-140/85-90 (either top OR bottom being high), then call for change in your medication to the 20/12.5 mg dose.  If medication dose is changed, then continue to monitor regularly, and return in 1-2 months with list of blood pressures (and bring your monitor to have it verified).  Your blood pressure on repeat testing was 120/80  Return fasting for your cholesterol check. I'll let you get the chem panel done in October pre-operatively as ordered by Dr. Bonnielee Haff there is anything abnormal, please bring it to my attention.  Yearly flu shots are recommended.

## 2012-07-19 NOTE — Progress Notes (Signed)
Chief Complaint  Patient presents with  . Hypertension    6 month follow up.   Hypertension follow-up:  Blood pressures aren't being checked elsewhere, as his machine wasn't accurate. At other doctor visits it has been running low 130's/high 80's-90.  Denies dizziness, headaches, chest pain.  Denies side effects of medications.  Hyperlipidemia follow-up:  Patient is reportedly following a low-fat, low cholesterol diet.  Compliant with medications and denies medication side effects  Planning for seed implant in October for prostate cancer.  Past Medical History  Diagnosis Date  . Hyperlipidemia   . Colonic polyp 2007   Past Surgical History  Procedure Date  . Biopsy prostate 03/24/12    gleason 3+3=6/prostate volume 31 cc's   History   Social History  . Marital Status: Married    Spouse Name: N/A    Number of Children: N/A  . Years of Education: N/A   Occupational History  . Not on file.   Social History Main Topics  . Smoking status: Former Smoker    Types: Cigarettes    Quit date: 10/27/1978  . Smokeless tobacco: Not on file  . Alcohol Use: 12.6 oz/week    21 Glasses of wine per week     daily 2-3 drinks per day prior to dinner  . Drug Use: No  . Sexually Active: Yes     Married with 3 children   Other Topics Concern  . Not on file   Social History Narrative  . No narrative on file   Current Outpatient Prescriptions on File Prior to Visit  Medication Sig Dispense Refill  . Doxylamine Succinate, Sleep, (UNISOM PO) Take 1 capsule by mouth.        Marland Kitchen lisinopril-hydrochlorothiazide (PRINZIDE,ZESTORETIC) 10-12.5 MG per tablet Take 1 tablet by mouth daily.  30 tablet  prn  . DISCONTD: atorvastatin (LIPITOR) 20 MG tablet TAKE 1 TABLET (20 MG TOTAL) BY MOUTH DAILY.  30 tablet  0   No Known Allergies  ROS: denies headaches, dizziness, fevers, URI symptoms, chest pain, cough, shortness of breath, myalgias, joint pains, skin rashes, GI complaints, GU complaints, or other  concerns.  PHYSICAL EXAM: BP 138/92  Pulse 72  Ht 5\' 10"  (1.778 m)  Wt 181 lb (82.101 kg)  BMI 25.97 kg/m2 120/80 on repeat by MD Well developed, pleasant male in no distress Neck: no lymphadenopathy, thyromegaly or mass Heart: regular rate and rhythm without murmur Lungs: clear bilaterally Abdomen: soft, nontender, no mass Extremities: no edema Skin: no rash Psych: normal mood, affect, hygiene and grooming  ASSESSMENT/PLAN:  1. Pure hypercholesterolemia  atorvastatin (LIPITOR) 20 MG tablet, Lipid panel  2. Essential hypertension, benign    3. Adenocarcinoma of prostate     BP's have been borderline, but at doctor's visits discussing prostate cancer.  Repeat BP improved.    Briefly discussed increasing dose to 20/12.5 if BP's consistently elevated.  Her prefers to get a new monitor and check it more regularly over the next month or so.  If remains elevated, will call for dose change.  Return in 1-2 months with list of home BP's after change in dose.  Low sodium diet reviewed.  Lipids have previously been at goal.  Not fasting today.  Return fasting for lipid profile.  c-met scheduled through urologist or next month, okay to just have done then.  Refilled meds x 6 months.  Flu shot recommended, declined today.

## 2012-07-29 ENCOUNTER — Other Ambulatory Visit (INDEPENDENT_AMBULATORY_CARE_PROVIDER_SITE_OTHER): Payer: BC Managed Care – PPO

## 2012-07-29 DIAGNOSIS — Z23 Encounter for immunization: Secondary | ICD-10-CM

## 2012-07-29 DIAGNOSIS — E7800 Pure hypercholesterolemia, unspecified: Secondary | ICD-10-CM

## 2012-07-29 LAB — LIPID PANEL
HDL: 58 mg/dL (ref >39)
LDL Cholesterol: 143 mg/dL — ABNORMAL HIGH (ref 0–99)
Triglycerides: 101 mg/dL (ref <150)

## 2012-07-30 ENCOUNTER — Encounter: Payer: Self-pay | Admitting: Family Medicine

## 2012-08-05 ENCOUNTER — Other Ambulatory Visit: Payer: Self-pay | Admitting: Family Medicine

## 2012-08-05 ENCOUNTER — Telehealth: Payer: Self-pay | Admitting: *Deleted

## 2012-08-05 LAB — CBC
HCT: 45 % (ref 39.0–52.0)
Hemoglobin: 15.7 g/dL (ref 13.0–17.0)
MCH: 31 pg (ref 26.0–34.0)
MCHC: 34.9 g/dL (ref 30.0–36.0)
MCV: 88.8 fL (ref 78.0–100.0)
Platelets: 307 10*3/uL (ref 150–400)
RBC: 5.07 MIL/uL (ref 4.22–5.81)
RDW: 12.3 % (ref 11.5–15.5)
WBC: 8 10*3/uL (ref 4.0–10.5)

## 2012-08-05 LAB — COMPREHENSIVE METABOLIC PANEL
ALT: 45 U/L (ref 0–53)
AST: 25 U/L (ref 0–37)
Albumin: 4.2 g/dL (ref 3.5–5.2)
Alkaline Phosphatase: 72 U/L (ref 39–117)
BUN: 15 mg/dL (ref 6–23)
CO2: 28 mEq/L (ref 19–32)
Calcium: 9.5 mg/dL (ref 8.4–10.5)
Chloride: 98 mEq/L (ref 96–112)
Creatinine, Ser: 1.02 mg/dL (ref 0.50–1.35)
GFR calc Af Amer: >90 mL/min (ref >90)
GFR calc non Af Amer: 79 mL/min — ABNORMAL LOW (ref >90)
Glucose, Bld: 109 mg/dL — ABNORMAL HIGH (ref 70–99)
Potassium: 4.2 mEq/L (ref 3.5–5.1)
Sodium: 136 mEq/L (ref 135–145)
Total Bilirubin: 0.8 mg/dL (ref 0.3–1.2)
Total Protein: 7.2 g/dL (ref 6.0–8.3)

## 2012-08-05 LAB — APTT: aPTT: 27 seconds (ref 24–37)

## 2012-08-05 LAB — PROTIME-INR
INR: 0.93 (ref 0.00–1.49)
Prothrombin Time: 12.4 seconds (ref 11.6–15.2)

## 2012-08-05 NOTE — Telephone Encounter (Signed)
Called patient to remind of appt., lvm for a return call 

## 2012-08-10 ENCOUNTER — Encounter (HOSPITAL_BASED_OUTPATIENT_CLINIC_OR_DEPARTMENT_OTHER): Payer: Self-pay | Admitting: *Deleted

## 2012-08-12 ENCOUNTER — Encounter (HOSPITAL_BASED_OUTPATIENT_CLINIC_OR_DEPARTMENT_OTHER): Payer: Self-pay | Admitting: *Deleted

## 2012-08-12 ENCOUNTER — Telehealth: Payer: Self-pay | Admitting: *Deleted

## 2012-08-12 ENCOUNTER — Other Ambulatory Visit: Payer: Self-pay | Admitting: Urology

## 2012-08-12 NOTE — H&P (Signed)
History of Present Illness     Prostatitis: He was treated for prostatitis with antibiotic therapy in 1/07 by Dr. Wanda Plump. This did result in an elevated PSA but then returned to normal.  Adenocarcinoma of the prostate: His PSA was noted to be 10.0 however he was felt to have an acute prostatitis and with antibiotic therapy it decreased to 6.4 and with further antibiotic therapy 6 weeks later was down to 1.91. He then developed a slow rise in his PSA and despite antibiotics it remained greater than 4 and therefore on 03/24/12 underwent TRUS/BX which revealed a 31 cc prostate. Pathology: Gleason 6 adenocarcinoma in 6/6 lobes.   Interval history: No new complaints are noted today.   Past Medical History Problems  1. History of  Hyperlipidemia 272.4  Surgical History Problems  1. History of  Biopsy Of The Prostate Needle 2. History of  No Surgical Problems  Current Meds 1. Lipitor 20 MG Oral Tablet; Therapy: (Recorded:15Apr2013) to 2. Lisinopril-Hydrochlorothiazide 10-12.5 MG Oral Tablet; Therapy: (Recorded:15Apr2013) to 3. Unisom TABS; Therapy: (Recorded:15Apr2013) to  Allergies Medication  1. No Known Drug Allergies  Family History Problems  1. Family history of  Cancer 2. Family history of  Diabetes Mellitus V18.0 3. Family history of  Family Health Status Number Of Children 3 4. Family history of  Hyperlipidemia 5. Family history of  Hypertension V17.49 6. Family history of  Lung Cancer V16.1 7. Family history of  Renal Disease 8. Family history of  Stroke Syndrome V17.1  Social History Problems  1. Alcohol Use 2. Marital History - Currently Married  Results/Data   Partin table results: His probability of indolent cancer is 22%. There is a 90% probability of organ confined disease, a 6% chance of extracapsular extension, a 1% probability of seminal vesicle invasion and a 1.3% probability of lymph node involvement. His 5 and 10 year progression free probability with  radical prostatectomy is 97 and 96% respectively with a 5 year progression free probability with brachytherapy of 92%.   Review of Systems Genitourinary, constitutional, skin, eye, otolaryngeal, hematologic/lymphatic, cardiovascular, pulmonary, endocrine, musculoskeletal, gastrointestinal, neurological and psychiatric system(s) were reviewed and pertinent findings if present are noted.  Genitourinary: nocturia.    Vitals Vital Signs BMI Calculated: 25.9 BSA Calculated: 2.04 Height: 5 ft 11 in Weight: 185 lb  Blood Pressure: 139 / 91 Temperature: 98 F Heart Rate: 104  Physical Exam Constitutional: Well nourished and well developed . No acute distress.  ENT:. The ears and nose are normal in appearance.  Neck: The appearance of the neck is normal and no neck mass is present.  Pulmonary: No respiratory distress and normal respiratory rhythm and effort.  Cardiovascular: Heart rate and rhythm are normal . No peripheral edema.  Abdomen: The abdomen is soft and nontender. No masses are palpated. No CVA tenderness. No hernias are palpable. No hepatosplenomegaly noted.  Rectal: Rectal exam demonstrates normal sphincter tone, no tenderness and no masses. Estimated prostate size is 1+. The prostate has no nodularity and is not tender. The left seminal vesicle is nonpalpable. The right seminal vesicle is nonpalpable. The perineum is normal on inspection.  Genitourinary: Examination of the penis demonstrates no discharge, no masses, no lesions and a normal meatus. The scrotum is without lesions. The right epididymis is palpably normal and non-tender. The left epididymis is palpably normal and non-tender. The right testis is non-tender and without masses. The left testis is non-tender and without masses.  Lymphatics: The femoral and inguinal nodes are not enlarged  or tender.  Skin: Normal skin turgor, no visible rash and no visible skin lesions.  Neuro/Psych:. Mood and affect are appropriate.         Assessment Assessed  1. Adenocarcinoma Of The Prostate Gland 185 1   Plan  The patient was counseled about the natural history of prostate cancer and the standard treatment options that are available for prostate cancer. It was explained to him how his age and life expectancy, clinical stage, Gleason score, and PSA affect his prognosis, the decision to proceed with additional staging studies, as well as how that information influences recommended treatment strategies. We discussed the roles for active surveillance, radiation therapy, surgical therapy, androgen deprivation, as well as ablative therapy options for the treatment of prostate cancer as appropriate to his individual cancer situation. We discussed the risks and benefits of these options with regard to their impact on cancer control and also in terms of potential adverse events, complications, and impact on quiality of life particularly related to urinary, bowel, and sexual function. The patient was encouraged to ask questions throughout the discussion today and all questions were answered to his stated satisfaction. In addition, the patient was provided with and/or directed to appropriate resources and literature for further education about prostate cancer and treatment options.   We discussed surgical therapy for prostate cancer including the different available surgical approaches. We discussed, in detail, the risks and expectations of surgery with regard to cancer control, urinary control, and erectile function as well as the expected postoperative recovery process. The risks, potential complications/adverse events of radical prostatectomy as well as alternative options were explained to the patient.       He has decided to proceed with seed implantation.

## 2012-08-12 NOTE — Telephone Encounter (Signed)
CALLED PATIENT TO REMIND OF PROCEDURE FOR 08-13-12, LVM FOR A RETURN CALL

## 2012-08-12 NOTE — Progress Notes (Signed)
NPO AFTER MN. ARRIVES AT 0615. CURRENT LAB WORK, EKG, AND CXR IN EPIC AND CHART. WILL TAKE LIPITOR AM OF SURG W/ SIP OF WATER AND DO FLEET ENEMA.

## 2012-08-13 ENCOUNTER — Encounter (HOSPITAL_BASED_OUTPATIENT_CLINIC_OR_DEPARTMENT_OTHER): Payer: Self-pay | Admitting: Anesthesiology

## 2012-08-13 ENCOUNTER — Encounter (HOSPITAL_BASED_OUTPATIENT_CLINIC_OR_DEPARTMENT_OTHER)
Admission: RE | Disposition: A | Discharge status: Home / Self Care | Payer: Self-pay | Source: Ambulatory Visit | Attending: Urology

## 2012-08-13 ENCOUNTER — Ambulatory Visit (HOSPITAL_BASED_OUTPATIENT_CLINIC_OR_DEPARTMENT_OTHER)
Admission: RE | Admit: 2012-08-13 | Discharge: 2012-08-13 | Disposition: A | Discharge status: Home / Self Care | Payer: BC Managed Care – PPO | Source: Ambulatory Visit | Attending: Urology | Admitting: Urology

## 2012-08-13 ENCOUNTER — Other Ambulatory Visit: Payer: Self-pay

## 2012-08-13 ENCOUNTER — Ambulatory Visit (HOSPITAL_COMMUNITY): Payer: BC Managed Care – PPO

## 2012-08-13 ENCOUNTER — Encounter (HOSPITAL_BASED_OUTPATIENT_CLINIC_OR_DEPARTMENT_OTHER): Payer: Self-pay | Admitting: *Deleted

## 2012-08-13 ENCOUNTER — Ambulatory Visit (HOSPITAL_BASED_OUTPATIENT_CLINIC_OR_DEPARTMENT_OTHER): Payer: BC Managed Care – PPO | Admitting: Anesthesiology

## 2012-08-13 DIAGNOSIS — C61 Malignant neoplasm of prostate: Secondary | ICD-10-CM | POA: Insufficient documentation

## 2012-08-13 DIAGNOSIS — Z79899 Other long term (current) drug therapy: Secondary | ICD-10-CM | POA: Insufficient documentation

## 2012-08-13 DIAGNOSIS — E785 Hyperlipidemia, unspecified: Secondary | ICD-10-CM | POA: Insufficient documentation

## 2012-08-13 HISTORY — PX: CYSTOSCOPY: SHX5120

## 2012-08-13 HISTORY — DX: Essential (primary) hypertension: I10

## 2012-08-13 HISTORY — DX: Malignant neoplasm of prostate: C61

## 2012-08-13 HISTORY — PX: RADIOACTIVE SEED IMPLANT: SHX5150

## 2012-08-13 HISTORY — DX: Nocturia: R35.1

## 2012-08-13 SURGERY — INSERTION, RADIATION SOURCE, PROSTATE
Anesthesia: General | Site: Prostate | Wound class: Clean Contaminated

## 2012-08-13 MED ORDER — CIPROFLOXACIN IN D5W 400 MG/200ML IV SOLN
400.0000 mg | INTRAVENOUS | Status: AC
  Administered 2012-08-13: 400 mg via INTRAVENOUS

## 2012-08-13 MED ORDER — ACETAMINOPHEN 10 MG/ML IV SOLN: 1000.0000 mg | Freq: Once | INTRAVENOUS | Status: DC | PRN

## 2012-08-13 MED ORDER — STERILE WATER FOR IRRIGATION IR SOLN
Status: DC | PRN
  Administered 2012-08-13: 3000 mL

## 2012-08-13 MED ORDER — PROMETHAZINE HCL 25 MG/ML IJ SOLN: 6.2500 mg | INTRAMUSCULAR | Status: DC | PRN

## 2012-08-13 MED ORDER — EPHEDRINE SULFATE 50 MG/ML IJ SOLN
INTRAMUSCULAR | Status: DC | PRN
  Administered 2012-08-13: 10 mg via INTRAVENOUS

## 2012-08-13 MED ORDER — IOHEXOL 350 MG/ML SOLN
INTRAVENOUS | Status: DC | PRN
  Administered 2012-08-13: 3 mL

## 2012-08-13 MED ORDER — HYDROCODONE-ACETAMINOPHEN 10-325 MG PO TABS: 1.0000 | ORAL_TABLET | ORAL | Status: DC | PRN

## 2012-08-13 MED ORDER — FENTANYL CITRATE 0.05 MG/ML IJ SOLN
INTRAMUSCULAR | Status: DC | PRN
  Administered 2012-08-13: 25 ug via INTRAVENOUS
  Administered 2012-08-13 (×2): 50 ug via INTRAVENOUS
  Administered 2012-08-13: 25 ug via INTRAVENOUS
  Administered 2012-08-13: 50 ug via INTRAVENOUS

## 2012-08-13 MED ORDER — LIDOCAINE HCL (CARDIAC) 20 MG/ML IV SOLN
INTRAVENOUS | Status: DC | PRN
  Administered 2012-08-13: 100 mg via INTRAVENOUS

## 2012-08-13 MED ORDER — KETOROLAC TROMETHAMINE 30 MG/ML IJ SOLN
INTRAMUSCULAR | Status: DC | PRN
  Administered 2012-08-13: 30 mg via INTRAVENOUS

## 2012-08-13 MED ORDER — OXYCODONE HCL 5 MG PO TABS: 5.0000 mg | ORAL_TABLET | Freq: Once | ORAL | Status: DC | PRN

## 2012-08-13 MED ORDER — DEXAMETHASONE SODIUM PHOSPHATE 4 MG/ML IJ SOLN
INTRAMUSCULAR | Status: DC | PRN
  Administered 2012-08-13: 10 mg via INTRAVENOUS

## 2012-08-13 MED ORDER — LACTATED RINGERS IV SOLN
INTRAVENOUS | Status: DC
  Administered 2012-08-13: 09:00:00 via INTRAVENOUS
  Administered 2012-08-13: 100 mL/h via INTRAVENOUS

## 2012-08-13 MED ORDER — OXYCODONE HCL 5 MG/5ML PO SOLN: 5.0000 mg | Freq: Once | ORAL | Status: DC | PRN

## 2012-08-13 MED ORDER — ONDANSETRON HCL 4 MG/2ML IJ SOLN
INTRAMUSCULAR | Status: DC | PRN
  Administered 2012-08-13: 4 mg via INTRAVENOUS

## 2012-08-13 MED ORDER — FLEET ENEMA 7-19 GM/118ML RE ENEM: 1.0000 | ENEMA | Freq: Once | RECTAL | Status: DC

## 2012-08-13 MED ORDER — ACETAMINOPHEN 10 MG/ML IV SOLN
INTRAVENOUS | Status: DC | PRN
  Administered 2012-08-13: 1000 mg via INTRAVENOUS

## 2012-08-13 MED ORDER — HYDROMORPHONE HCL PF 1 MG/ML IJ SOLN: 0.2500 mg | INTRAMUSCULAR | Status: DC | PRN

## 2012-08-13 MED ORDER — MIDAZOLAM HCL 5 MG/5ML IJ SOLN
INTRAMUSCULAR | Status: DC | PRN
  Administered 2012-08-13: 2 mg via INTRAVENOUS

## 2012-08-13 MED ORDER — PROPOFOL 10 MG/ML IV BOLUS
INTRAVENOUS | Status: DC | PRN
  Administered 2012-08-13: 200 mg via INTRAVENOUS

## 2012-08-13 MED ORDER — MEPERIDINE HCL 25 MG/ML IJ SOLN: 6.2500 mg | INTRAMUSCULAR | Status: DC | PRN

## 2012-08-13 MED ORDER — CIPROFLOXACIN HCL 500 MG PO TABS: 500.0000 mg | ORAL_TABLET | Freq: Two times a day (BID) | ORAL | Status: DC

## 2012-08-13 SURGICAL SUPPLY — 27 items
BAG URINE DRAINAGE (UROLOGICAL SUPPLIES) ×3 IMPLANT
BLADE SURG ROTATE 9660 (MISCELLANEOUS) ×3 IMPLANT
CATH FOLEY 2WAY SLVR  5CC 16FR (CATHETERS) ×2
CATH FOLEY 2WAY SLVR 5CC 16FR (CATHETERS) ×4 IMPLANT
CATH ROBINSON RED A/P 20FR (CATHETERS) ×3 IMPLANT
CLOTH BEACON ORANGE TIMEOUT ST (SAFETY) ×3 IMPLANT
COVER MAYO STAND STRL (DRAPES) ×3 IMPLANT
COVER TABLE BACK 60X90 (DRAPES) ×3 IMPLANT
DRSG TEGADERM 4X4.75 (GAUZE/BANDAGES/DRESSINGS) ×3 IMPLANT
DRSG TEGADERM 8X12 (GAUZE/BANDAGES/DRESSINGS) ×3 IMPLANT
GLOVE BIO SURGEON STRL SZ7 (GLOVE) ×3 IMPLANT
GLOVE BIO SURGEON STRL SZ7.5 (GLOVE) IMPLANT
GLOVE BIO SURGEON STRL SZ8 (GLOVE) ×6 IMPLANT
GLOVE BIOGEL PI IND STRL 7.0 (GLOVE) ×4 IMPLANT
GLOVE BIOGEL PI INDICATOR 7.0 (GLOVE) ×2
GLOVE ECLIPSE 8.0 STRL XLNG CF (GLOVE) ×15 IMPLANT
GOWN STRL NON-REIN LRG LVL3 (GOWN DISPOSABLE) ×3 IMPLANT
GOWN STRL REIN XL XLG (GOWN DISPOSABLE) ×3 IMPLANT
HOLDER FOLEY CATH W/STRAP (MISCELLANEOUS) ×3 IMPLANT
IV NS IRRIG 3000ML ARTHROMATIC (IV SOLUTION) IMPLANT
PACK CYSTOSCOPY (CUSTOM PROCEDURE TRAY) ×3 IMPLANT
SYRINGE 10CC LL (SYRINGE) ×3 IMPLANT
TOWEL NATURAL 6PK STERILE (DISPOSABLE) ×6 IMPLANT
UNDERPAD 30X30 INCONTINENT (UNDERPADS AND DIAPERS) ×6 IMPLANT
WATER STERILE IRR 3000ML UROMA (IV SOLUTION) ×3 IMPLANT
WATER STERILE IRR 500ML POUR (IV SOLUTION) ×3 IMPLANT
nucletron selectseed ×3 IMPLANT

## 2012-08-13 NOTE — Anesthesia Preprocedure Evaluation (Addendum)
Anesthesia Evaluation  Patient identified by MRN, date of birth, ID band Patient awake    Reviewed: Allergy & Precautions, H&P , NPO status , Patient's Chart, lab work & pertinent test results  Airway Mallampati: I TM Distance: >3 FB Neck ROM: Full    Dental  (+) Dental Advisory Given and Teeth Intact   Pulmonary neg pulmonary ROS,  breath sounds clear to auscultation  Pulmonary exam normal       Cardiovascular hypertension, Pt. on medications - Past MI and - CHF Rhythm:Regular Rate:Normal     Neuro/Psych negative neurological ROS  negative psych ROS   GI/Hepatic negative GI ROS, Neg liver ROS,   Endo/Other  negative endocrine ROS  Renal/GU negative Renal ROS     Musculoskeletal negative musculoskeletal ROS (+)   Abdominal   Peds  Hematology negative hematology ROS (+)   Anesthesia Other Findings   Reproductive/Obstetrics                         Anesthesia Physical Anesthesia Plan  ASA: II  Anesthesia Plan: General   Post-op Pain Management:    Induction: Intravenous  Airway Management Planned: LMA  Additional Equipment:   Intra-op Plan:   Post-operative Plan: Extubation in OR  Informed Consent: I have reviewed the patients History and Physical, chart, labs and discussed the procedure including the risks, benefits and alternatives for the proposed anesthesia with the patient or authorized representative who has indicated his/her understanding and acceptance.   Dental advisory given  Plan Discussed with: CRNA and Surgeon  Anesthesia Plan Comments:         Anesthesia Quick Evaluation

## 2012-08-13 NOTE — Op Note (Signed)
PATIENT:  Jeremy Bush  PRE-OPERATIVE DIAGNOSIS:  Adenocarcinoma of the prostate  POST-OPERATIVE DIAGNOSIS:  Same  PROCEDURE:  Procedure(s): 1. I-125 radioactive seed implantation 2. Cystoscopy  SURGEON:  Surgeon(s): Garnett Farm  Radiation oncologist: Dr. Margaretmary Dys  ANESTHESIA:  General  EBL:  Minimal  DRAINS: 16 French Foley catheter  INDICATION: Jeremy Bush  Description of procedure: After informed consent the patient was brought to the major OR, placed on the table and administered general anesthesia. He was then moved to the modified lithotomy position with his perineum perpendicular to the floor. His perineum and genitalia were then sterilely prepped. An official timeout was then performed. A 16 French Foley catheter was then placed in the bladder and filled with dilute contrast, a rectal tube was placed in the rectum and the transrectal ultrasound probe was placed in the rectum and affixed to the stand. He was then sterilely draped.  Real time ultrasonography was used along with the seed planning software spot-pro version 3.1-00. This was used to develop the seed plan including the number of needles as well as number of seeds required for complete and adequate coverage. Real-time ultrasonography was then used along with the previously developed plan and the Nucletron device to implant a total of 81 seeds using 23 needles. This proceeded without difficulty or complication.  A Foley catheter was then removed as well as the transrectal ultrasound probe and rectal probe. Flexible cystoscopy was then performed using the 17 French flexible scope which revealed a normal urethra throughout its length down to the sphincter which appeared intact. The prostatic urethra revealed bilobar hypertrophy but no evidence of obstruction, seeds, spacers or lesions. The bladder was then entered and fully and systematically inspected. The ureteral orifices were noted to be of  normal configuration and position. The mucosa revealed no evidence of tumors. There were also no stones identified within the bladder. I noted no seeds or spacers on the floor of the bladder and retroflexion of the scope revealed no seeds protruding from the base of the prostate.  The cystoscope was then removed and a new 16 French Foley catheter was then inserted and the balloon was filled with 10 cc of sterile water. This was connected to closed system drainage and the patient was awakened and taken to recovery room in stable and satisfactory condition. He tolerated procedure well and there were no intraoperative complications.

## 2012-08-13 NOTE — Anesthesia Procedure Notes (Signed)
Procedure Name: LMA Insertion Date/Time: 08/13/2012 7:50 AM Performed by: Maris Berger T Pre-anesthesia Checklist: Patient identified, Emergency Drugs available, Suction available and Patient being monitored Patient Re-evaluated:Patient Re-evaluated prior to inductionOxygen Delivery Method: Circle System Utilized Preoxygenation: Pre-oxygenation with 100% oxygen Intubation Type: IV induction Ventilation: Mask ventilation without difficulty LMA: LMA inserted LMA Size: 5.0 Number of attempts: 1 Airway Equipment and Method: bite block Placement Confirmation: positive ETCO2 Dental Injury: Teeth and Oropharynx as per pre-operative assessment

## 2012-08-13 NOTE — Anesthesia Postprocedure Evaluation (Signed)
Anesthesia Post Note  Patient: Jeremy Bush  Procedure(s) Performed: Procedure(s) (LRB): RADIOACTIVE SEED IMPLANT (N/A) CYSTOSCOPY FLEXIBLE (N/A)  Anesthesia type: General  Patient location: PACU  Post pain: Pain level controlled  Post assessment: Post-op Vital signs reviewed  Last Vitals: BP 122/85  Pulse 82  Temp 36.1 C (Oral)  Resp 12  Ht 5\' 10"  (1.778 m)  Wt 177 lb (80.287 kg)  BMI 25.40 kg/m2  SpO2 98%  Post vital signs: Reviewed  Level of consciousness: sedated  Complications: No apparent anesthesia complications

## 2012-08-13 NOTE — Interval H&P Note (Signed)
History and Physical Interval Note:  08/13/2012 7:25 AM  Jeremy Bush  has presented today for surgery, with the diagnosis of prostate cancer  The various methods of treatment have been discussed with the patient and family. After consideration of risks, benefits and other options for treatment, the patient has consented to  Procedure(s) (LRB) with comments: RADIOACTIVE SEED IMPLANT (N/A) -      seeds implanted CYSTOSCOPY FLEXIBLE (N/A) - no seeds found in bladder as a surgical intervention .  The patient's history has been reviewed, patient examined, no change in status, stable for surgery.  I have reviewed the patient's chart and labs.  Questions were answered to the patient's satisfaction.     Garnett Farm

## 2012-08-13 NOTE — Transfer of Care (Signed)
Immediate Anesthesia Transfer of Care Note  Patient: Jeremy Bush  Procedure(s) Performed: Procedure(s) (LRB) with comments: RADIOACTIVE SEED IMPLANT (N/A) -  81    seeds implanted CYSTOSCOPY FLEXIBLE (N/A) - no seeds found in bladder  Patient Location: PACU  Anesthesia Type: General  Level of Consciousness: awake, alert  and oriented  Airway & Oxygen Therapy: Patient Spontanous Breathing and Patient connected to nasal cannula oxygen  Post-op Assessment: Report given to PACU RN  Post vital signs: Reviewed and stable  Complications: No apparent anesthesia complications

## 2012-08-14 NOTE — Op Note (Signed)
  Radiation Oncology         [phone] ________________________________  Name: Jeremy Bush MRN: [id number]  Date: 06/16/2012  DOB: 1955/12/13       Prostate Seed Implant  WU:[email or web address],JYN A, MD  No ref. provider found  DIAGNOSIS: 56 year old gentleman with stage T1c adenocarcinoma of the prostate with a Gleason's score of 3+3 and a PSA of 4.05  PROCEDURE: Insertion of radioactive I-125 seeds into the prostate gland.  RADIATION DOSE: 145 Gy, definitive therapy.  TECHNIQUE: Jeremy Bush was brought to the operating room with the urologist. He was placed in the dorsolithotomy position. He was catheterized and a rectal tube was inserted. The perineum was shaved, prepped and draped. The ultrasound probe was then introduced into the rectum to see the prostate gland.  TREATMENT DEVICE: A needle grid was attached to the ultrasound probe stand and anchor needles were placed.  COMPLEX ISODOSE CALCULATION: The prostate was imaged in 3D using a sagittal sweep of the prostate probe. These images were transferred to the planning computer. There, the prostate, urethra and rectum were defined on each axial reconstructed image. Then, the software created an optimized plan and a few seed positions were adjusted. Then the accepted plan was uploaded to the seed Selectron afterloading unit.  SPECIAL TREATMENT PROCEDURE/SUPERVISION AND HANDLING: The Nucletron FIRST system was used to place the needles under sagittal guidance. A total of 23 needles were used to deposit 81 seeds in the prostate gland. The individual seed activity was 0.423 mCi for a total implant activity of 34.26 mCi.  COMPLEX SIMULATION: At the end of the procedure, an anterior radiograph of the pelvis was obtained to document seed positioning and count. Cystoscopy was performed to check the urethra and bladder.  MICRODOSIMETRY: At the end of the procedure, the patient was emitting 0.5 mrem/hr at 1 meter. Accordingly, he was  considered safe for hospital discharge.  PLAN: The patient will return to the radiation oncology clinic for post implant CT dosimetry in three weeks.   ________________________________  Artist Pais Kathrynn Running, M.D.

## 2012-08-16 ENCOUNTER — Encounter (HOSPITAL_BASED_OUTPATIENT_CLINIC_OR_DEPARTMENT_OTHER): Payer: Self-pay | Admitting: Urology

## 2012-08-20 ENCOUNTER — Encounter: Payer: Self-pay | Admitting: Radiation Oncology

## 2012-09-01 ENCOUNTER — Telehealth: Payer: Self-pay | Admitting: *Deleted

## 2012-09-01 ENCOUNTER — Encounter: Payer: Self-pay | Admitting: Radiation Oncology

## 2012-09-01 DIAGNOSIS — K635 Polyp of colon: Secondary | ICD-10-CM | POA: Insufficient documentation

## 2012-09-01 DIAGNOSIS — R351 Nocturia: Secondary | ICD-10-CM | POA: Insufficient documentation

## 2012-09-01 DIAGNOSIS — E785 Hyperlipidemia, unspecified: Secondary | ICD-10-CM | POA: Insufficient documentation

## 2012-09-01 DIAGNOSIS — I10 Essential (primary) hypertension: Secondary | ICD-10-CM | POA: Insufficient documentation

## 2012-09-01 NOTE — Telephone Encounter (Signed)
XXXX 

## 2012-09-01 NOTE — Telephone Encounter (Signed)
CALLED PATIENT TO REMIND OF APPTS. FOR 09-02-12, CONFIRMED APPTS. WITH PATIENT'S WIFE

## 2012-09-02 ENCOUNTER — Ambulatory Visit
Admission: RE | Admit: 2012-09-02 | Discharge: 2012-09-02 | Disposition: A | Discharge status: Home / Self Care | Payer: BC Managed Care – PPO | Source: Ambulatory Visit | Attending: Radiation Oncology | Admitting: Radiation Oncology

## 2012-09-02 ENCOUNTER — Encounter: Payer: Self-pay | Admitting: Radiation Oncology

## 2012-09-02 ENCOUNTER — Other Ambulatory Visit: Payer: Self-pay | Admitting: Radiation Oncology

## 2012-09-02 VITALS — BP 149/92 | HR 85 | Temp 98.0°F | Resp 20 | Ht 70.0 in | Wt 182.5 lb

## 2012-09-02 DIAGNOSIS — C61 Malignant neoplasm of prostate: Secondary | ICD-10-CM

## 2012-09-02 DIAGNOSIS — Z79899 Other long term (current) drug therapy: Secondary | ICD-10-CM | POA: Insufficient documentation

## 2012-09-02 DIAGNOSIS — E785 Hyperlipidemia, unspecified: Secondary | ICD-10-CM | POA: Insufficient documentation

## 2012-09-02 NOTE — Addendum Note (Signed)
Encounter addended by: Glennie Hawk, RN on: 09/02/2012  8:41 AM<BR>     Documentation filed: Inpatient Document Flowsheet

## 2012-09-02 NOTE — Progress Notes (Signed)
Pt began trial of Uribel yesterday for urinary urgency; he reports slight improvement. Nocturia x 3-4, daytime freq q 2hrs, pressure to void, denies dysuria , denies bowel issues, fatigue.

## 2012-09-02 NOTE — Progress Notes (Signed)
  Radiation Oncology         [phone] ________________________________  Name: Arno Cullers MRN: [id number]  Date: 09/02/2012  DOB: 1956-01-19  Follow-Up Visit Note  CC: Lavonda Jumbo, MD  Garnett Farm, MD  Diagnosis:   56 year old gentleman with stage T1c adenocarcinoma of the prostate with a Gleason's score of 3+3 and a PSA of 4.05  Interval Since Last Radiation:  3 weeks  Narrative:  The patient returns today for routine follow-up.  He is complaining of increased urinary frequency and urinary hesitation symptoms. He has nocturia x3-4 and frequency.  He started Uribel yesterday with improvement. He denies any bowel symptoms.  ALLERGIES:   has no known allergies.  Meds: Current Outpatient Prescriptions  Medication Sig Dispense Refill  . atorvastatin (LIPITOR) 20 MG tablet       . Doxylamine Succinate, Sleep, (UNISOM PO) Take 1 capsule by mouth as needed.       Marland Kitchen lisinopril-hydrochlorothiazide (PRINZIDE,ZESTORETIC) 10-12.5 MG per tablet Take 1 tablet by mouth every morning.      . Meth-Hyo-M Bl-Na Phos-Ph Sal (URIBEL PO) Take 1 tablet by mouth.        Physical Findings: The patient is in no acute distress. Patient is alert and oriented.  height is 5\' 10"  (1.778 m) and weight is 182 lb 8 oz (82.781 kg). His oral temperature is 98 F (36.7 C). His blood pressure is 149/92 and his pulse is 85. His respiration is 20. Marland Kitchen  No significant changes.  Lab Findings: Lab Results  Component Value Date   WBC 8.0 08/05/2012   HGB 15.7 08/05/2012   HCT 45.0 08/05/2012   MCV 88.8 08/05/2012   PLT 307 08/05/2012    Radiographic Findings:  Patient underwent CT imaging in our clinic for post implant dosimetry. The CT appears to demonstrate an adequate distribution of radioactive seeds throughout the prostate gland. There no seeds in her near the rectum. I suspect the final radiation plan and dosimetry will show appropriate coverage of the prostate gland.   Impression: The patient  is recovering from the effects of radiation. His urinary symptoms should gradually improve over the next 4-6 months. We talked about this today. He is encouraged by his improvement already and is otherwise please with his outcome.   Plan: Today, I spent time talking to the patient about his prostate seed implant and resolving urinary symptoms. Which for long-term followup for prostate cancer following seed implant. He understands that ongoing PSA determinations and digital rectal exams will help perform surveillance to rule out disease recurrence. He understands what to expect with his PSA measures. Patient was also educated today about some of the long-term effects would radiation including the Small risk for rectal bleeding and possibly erectile dysfunction. Talked about some of the general management approaches to these potential complications. However, I did encourage the patient to contact her office or return at any point if he has questions or concerns related to his previous radiation and prostate cancer.   _____________________________________  Artist Pais. Kathrynn Running, M.D.

## 2012-09-02 NOTE — Progress Notes (Signed)
  Radiation Oncology         [phone] ________________________________  Name: Kedrick Mcnamee MRN: [id number]  Date: 09/02/2012  DOB: 11-04-1955  COMPLEX SIMULATION NOTE  NARRATIVE:  The patient was brought to the CT Simulation planning suite today following prostate seed implantation approximately one month ago.  Identity was confirmed.  All relevant records and images related to the planned course of therapy were reviewed.  Then, the patient was set-up supine.  CT images were obtained.  The CT images were loaded into the planning software.  Then the prostate and rectum were contoured.  Treatment planning then occurred.  The implanted iodine 125 seeds were identified by the physics staff for projection of radiation distribution  I have requested : 3D Simulation  I have requested a DVH of the following structures: Prostate and rectum.    ________________________________  Artist Pais Kathrynn Running, M.D.

## 2012-09-07 ENCOUNTER — Ambulatory Visit: Admission: RE | Admit: 2012-09-07 | Payer: BC Managed Care – PPO | Source: Ambulatory Visit

## 2012-09-26 NOTE — Progress Notes (Signed)
  Radiation Oncology         [phone] ________________________________  Name: Kingsley Farace MRN: [id number]  Date: 09/20/2012   DOB: February 18, 1956  3-D Planning Note Prostate Brachytherapy  Diagnosis: 56 year old gentleman with stage T1c adenocarcinoma of the prostate with a Gleason's score of 3+3 and a PSA of 4.05  Narrative: Virgel Paling returned following prostate seed implantation for post implant planning. He underwent CT scan to delineate the three-dimensional structures of the pelvis and demonstrate the radiation distribution.  Results:   Prostate Coverage - The dose of radiation delivered to the 90% or more of the prostate gland (D90) was 114.29% of the prescription dose. This exceeds our goal of greater than 90%. Rectal Sparing - The volume of rectal tissue receiving the prescription dose or higher was 0.03 cc. This falls under our thresholds tolerance of 1.0 cc.  Impression: The prostate seed implant appears to show adequate target coverage and appropriate rectal sparing.  Plan:  The patient will continue to follow with urology for ongoing PSA determinations. I would anticipate a high likelihood for local tumor control with minimal risk for rectal morbidity.   Artist Pais Kathrynn Running, M.D.

## 2012-12-06 ENCOUNTER — Other Ambulatory Visit: Payer: Self-pay | Admitting: Family Medicine

## 2013-01-13 ENCOUNTER — Encounter: Payer: Self-pay | Admitting: Family Medicine

## 2013-01-13 ENCOUNTER — Ambulatory Visit (INDEPENDENT_AMBULATORY_CARE_PROVIDER_SITE_OTHER): Payer: BC Managed Care – PPO | Admitting: Family Medicine

## 2013-01-13 VITALS — BP 142/94 | HR 88 | Ht 70.0 in | Wt 182.0 lb

## 2013-01-13 LAB — COMPREHENSIVE METABOLIC PANEL
Alkaline Phosphatase: 81 U/L (ref 39–117)
BUN: 17 mg/dL (ref 6–23)
Glucose, Bld: 106 mg/dL — ABNORMAL HIGH (ref 70–99)
Total Bilirubin: 1 mg/dL (ref 0.3–1.2)

## 2013-01-13 LAB — POCT URINALYSIS DIPSTICK
Blood, UA: NEGATIVE
Protein, UA: NEGATIVE
Spec Grav, UA: 1.015
Urobilinogen, UA: NEGATIVE

## 2013-01-13 LAB — LIPID PANEL
Cholesterol: 206 mg/dL — ABNORMAL HIGH (ref 0–200)
Total CHOL/HDL Ratio: 3.1 Ratio
Triglycerides: 139 mg/dL (ref <150)
VLDL: 28 mg/dL (ref 0–40)

## 2013-01-13 NOTE — Patient Instructions (Addendum)
HEALTH MAINTENANCE RECOMMENDATIONS:  It is recommended that you get at least 30 minutes of aerobic exercise at least 5 days/week (for weight loss, you may need as much as 60-90 minutes). This can be any activity that gets your heart rate up. This can be divided in 10-15 minute intervals if needed, but try and build up your endurance at least once a week.  Weight bearing exercise is also recommended twice weekly.  Eat a healthy diet with lots of vegetables, fruits and fiber.  "Colorful" foods have a lot of vitamins (ie green vegetables, tomatoes, red peppers, etc).  Limit sweet tea, regular sodas and alcoholic beverages, all of which has a lot of calories and sugar.  Up to 2 alcoholic drinks daily may be beneficial for men (unless trying to lose weight, watch sugars).  Drink a lot of water.  Sunscreen of at least SPF 30 should be used on all sun-exposed parts of the skin when outside between the hours of 10 am and 4 pm (not just when at beach or pool, but even with exercise, golf, tennis, and yard work!)  Use a sunscreen that says "broad spectrum" so it covers both UVA and UVB rays, and make sure to reapply every 1-2 hours.  Remember to change the batteries in your smoke detectors when changing your clock times in the spring and fall.  Use your seat belt every time you are in a car, and please drive safely and not be distracted with cell phones and texting while driving.    Please call Dr. Terri Piedra to schedule appointment to have those spots on your ears checked out.

## 2013-01-13 NOTE — Progress Notes (Signed)
Chief Complaint  Patient presents with  . Annual Exam    fasting annual exam. No major concerns today. Last PSA by Dr.Ottelin went down so he doesn't have to follow up for 6 months.   Jeremy Bush is a 57 y.o. male who presents for a complete physical.  He has the following concerns:  Update on prostate cancer treatment:  Treated with radioactive seed implant.  PSA is down to 3.5, under the care of Dr. Vernie Ammons.  Next visit is in June.  Some urinary frequency, but improving.  Denies ED or other side effects.  Hypertension follow-up:  Blood pressures elsewhere are 120/80, lower in the evenings (110/70).   Denies dizziness, headaches, chest pain.  Denies side effects of medications.  Got a new wrist monitor--he brought it in and was accurate (actually a little higher than our reading).  Hyperlipidemia follow-up:  Patient is reportedly following a low-fat, low cholesterol diet--recent vacation, not quite as careful with diet.  Compliant with medications and denies medication side effects.  LDL was above goal at last check 6 months ago.  Immunization History  Administered Date(s) Administered  . Influenza Split 07/22/2011, 07/29/2012  . Td 09/15/2005   Last colonoscopy: 04/2012 Dr. Bosie Clos (polyps), q5 years Last PSA: per urologist Dentist: 3 times yearly Ophtho: yearly Exercise:  Mostly just on weekends (elliptical and treadmill), 30 minutes cardio and weights  Past Medical History  Diagnosis Date  . Hyperlipidemia   . Colonic polyp 2007, 2013  . Prostate cancer 03/24/12    gleason 6, vol 31.3 cc  . Hypertension   . Nocturia     Past Surgical History  Procedure Laterality Date  . Biopsy prostate  03/24/12    gleason 3+3=6/prostate volume 31 cc's  (md office)  . Radioactive seed implant  08/13/2012    Procedure: RADIOACTIVE SEED IMPLANT;  Surgeon: Garnett Farm, MD;  Location: [facility];  Service: Urology;  Laterality: N/A;   79    seeds implanted  .  Cystoscopy  08/13/2012    Procedure: CYSTOSCOPY FLEXIBLE;  Surgeon: Garnett Farm, MD;  Location: [facility];  Service: Urology;  Laterality: N/A;  no seeds found in bladder    History   Social History  . Marital Status: Married    Spouse Name: N/A    Number of Children: N/A  . Years of Education: N/A   Occupational History  . Not on file.   Social History Main Topics  . Smoking status: Never Smoker   . Smokeless tobacco: Never Used  . Alcohol Use: 8.4 oz/week    14 Glasses of wine per week     Comment: daily 2-3 drinks per day prior to dinner  . Drug Use: No  . Sexually Active: Yes -- Male partner(s)   Other Topics Concern  . Not on file   Social History Narrative   Lives with wife, no pets.  2 children and 1 stepchild.  Daughters in [place], stepson in [place]. 1 granddaughter    Family History  Problem Relation Age of Onset  . Cancer Mother     lung (smoker)  . Hypertension Mother   . Hyperlipidemia Mother   . Stroke Father     mini-stroke  . Hypertension Father   . Kidney disease Father     on dialysis  . Diabetes Father   . Healthy Sister     Current outpatient prescriptions:atorvastatin (LIPITOR) 20 MG tablet, , Disp: , Rfl: ;  Doxylamine Succinate,  Sleep, (UNISOM PO), Take 1 capsule by mouth as needed. , Disp: , Rfl: ;  lisinopril-hydrochlorothiazide (PRINZIDE,ZESTORETIC) 10-12.5 MG per tablet, TAKE 1 TABLET BY MOUTH DAILY., Disp: 30 tablet, Rfl: 98  No Known Allergies  ROS:  The patient denies anorexia, fever, weight changes, headaches, vision loss, decreased hearing, ear pain, hoarseness, chest pain, palpitations, dizziness, syncope, dyspnea on exertion, cough, swelling, nausea, vomiting, diarrhea, constipation, abdominal pain, melena, hematochezia, indigestion/heartburn, hematuria, incontinence, erectile dysfunction, nocturia, weakened urine stream, dysuria, genital lesions, joint pains, numbness, tingling, weakness, tremor, suspicious skin  lesions, depression, anxiety, abnormal bleeding/bruising, or enlarged lymph nodes. Urinary frequency is gradually improving.  PHYSICAL EXAM: BP 150/92  Pulse 88  Ht 5\' 10"  (1.778 m)  Wt 182 lb (82.555 kg)  BMI 26.11 kg/m2 142/94 on repeat by MD  General Appearance:  Alert, cooperative, no distress, appears stated age   Head:  Normocephalic, without obvious abnormality, atraumatic   Eyes:  PERRL, conjunctiva/corneas clear, EOM's intact, fundi  benign   Ears:  Normal TM's and external ear canals   Nose:  Nares normal, mucosa normal, no drainage or sinus tenderness   Throat:  Lips, mucosa, and tongue normal; teeth and gums normal   Neck:  Supple, no lymphadenopathy; thyroid: no enlargement/tenderness/nodules; no carotid  bruit or JVD   Back:  Spine nontender, no curvature, ROM normal, no CVA tenderness   Lungs:  Clear to auscultation bilaterally without wheezes, rales or ronchi; respirations unlabored   Chest Wall:  No tenderness or deformity   Heart:  Regular rate and rhythm, S1 and S2 normal, no murmur, rub  or gallop   Breast Exam:  No chest wall tenderness, masses or gynecomastia   Abdomen:  Soft, non-tender, nondistended, normoactive bowel sounds,  no masses, no hepatosplenomegaly   Genitalia:  Deferred to urologist   Rectal:  Deferred to urologist   Extremities:  No clubbing, cyanosis or edema   Pulses:  2+ and symmetric all extremities   Skin:  Skin color, texture, turgor normal. Thickened ulcerated lesions on both ears (not on cartilage/pinna, but on cartilage above ear canal, symmetric)  Lymph nodes:  Cervical, supraclavicular, and axillary nodes normal   Neurologic:  CNII-XII intact, normal strength, sensation and gait; reflexes 2+ and symmetric throughout          Psych: Normal mood, affect, hygiene and grooming.   ASSESSMENT/PLAN:  ASSESSMENT/PLAN:  Routine general medical examination at a health care facility - Plan: POCT Urinalysis Dipstick, Visual acuity  screening, TSH  Pure hypercholesterolemia - due for labs today.  Goal LDL,130.  if significantly higher, will need dose adjustment - Plan: Comprehensive metabolic panel, TSH, Lipid panel  Adenomatous colon polyp - next colonoscopy due 04/2017  Essential hypertension, benign - white coat component.  his machine is accurate and BP reading at home are acceptable.  continue current medication - Plan: Comprehensive metabolic panel  Adenocarcinoma of prostate - improving.  f/u per Dr. Vernie Ammons  Need for Tdap vaccination - Plan: Tdap vaccine greater than or equal to 7yo IM  F/u with Dr. Terri Piedra for suspicious skin lesions on ears.  Recommended at least 30 minutes of aerobic activity at least 5 days/week; proper sunscreen use reviewed; healthy diet and alcohol recommendations (less than or equal to 2 drinks/day) reviewed; regular seatbelt use; changing batteries in smoke detectors. Self-testicular exams. Immunization recommendations discussed--TdaP given today.  Consider shingles vaccine (age 11/next year, sooner if covered.  Full risks/benefits not reviewed today). Colonoscopy recommendations reviewed--UTD, due again 2018

## 2013-01-17 ENCOUNTER — Encounter: Payer: Self-pay | Admitting: Internal Medicine

## 2013-02-12 ENCOUNTER — Other Ambulatory Visit: Payer: Self-pay | Admitting: Family Medicine

## 2013-05-24 ENCOUNTER — Encounter: Payer: Self-pay | Admitting: *Deleted

## 2013-07-17 IMAGING — CR DG CHEST 2V
2 series · 2 of 2 positions shown · non-contrast
Comparison: None

CLINICAL DATA: Prostate cancer, preoperative assessment

CHEST - 2 VIEW

[w chest pa]
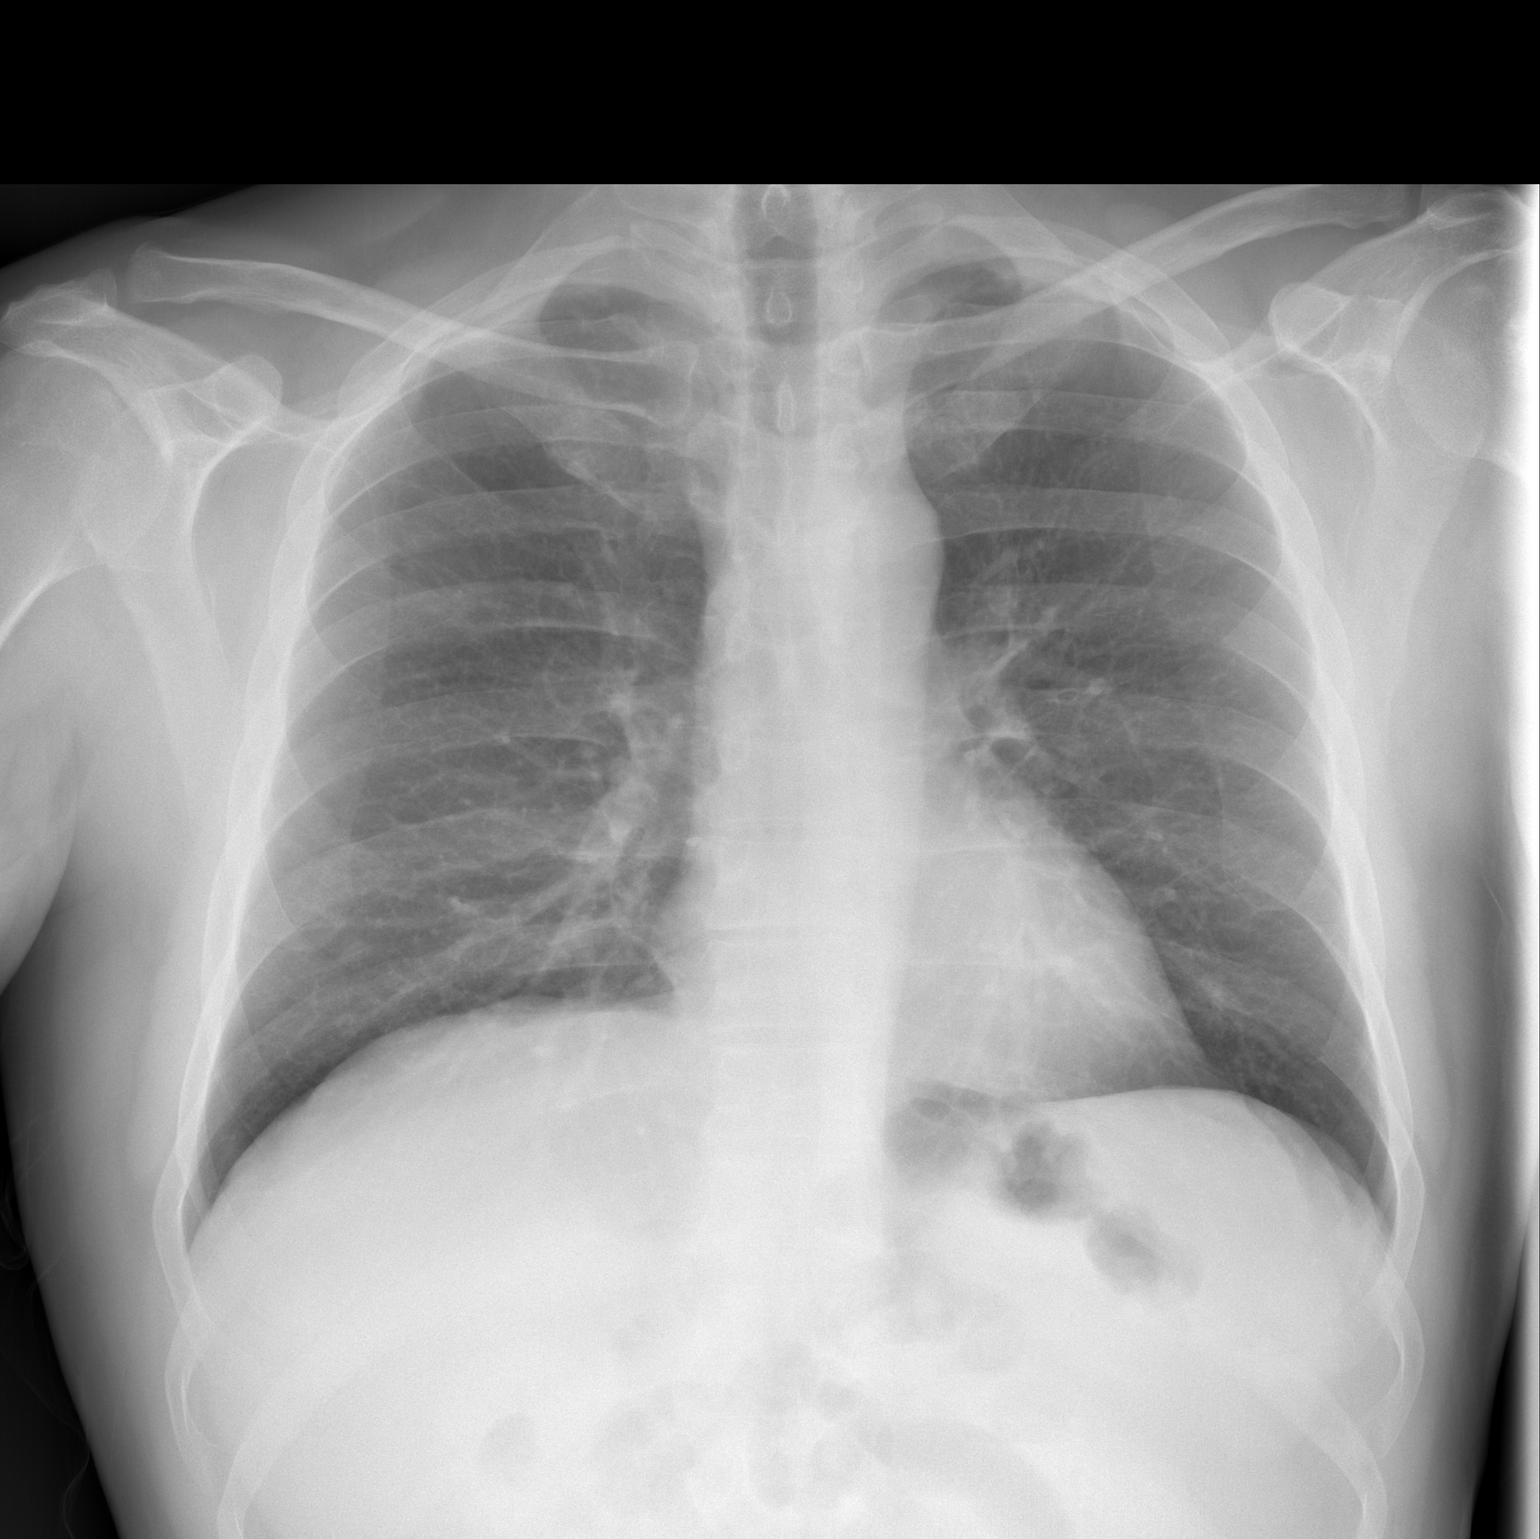

[w chest lat]
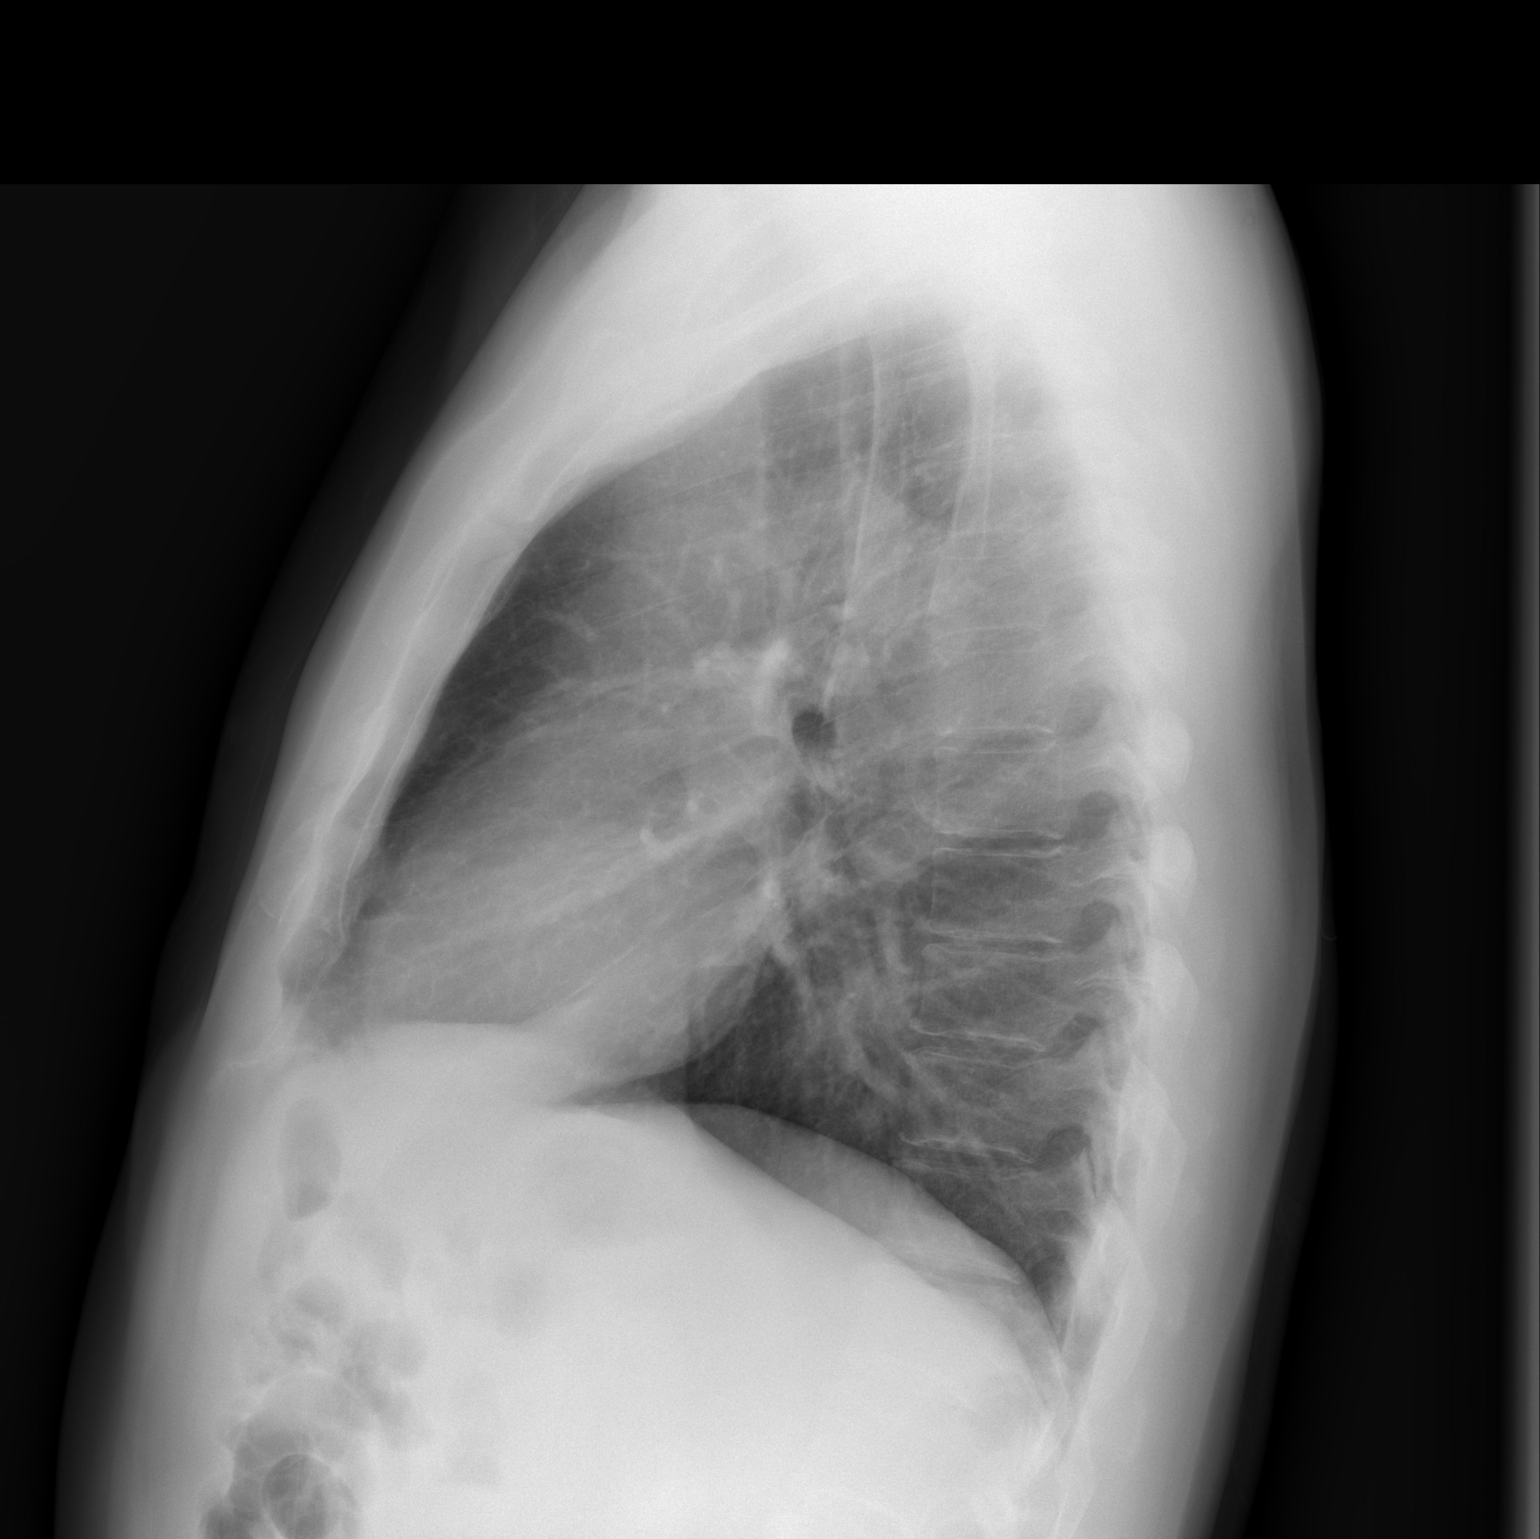

[2 of 2 positions shown; findings below may reference images not displayed]

FINDINGS: Normal heart size, mediastinal contours, and pulmonary vascularity.
Lungs clear.
No pleural effusion or pneumothorax.
No acute osseous findings.
IMPRESSION: No acute abnormalities.

## 2013-07-21 ENCOUNTER — Encounter: Payer: BC Managed Care – PPO | Admitting: Family Medicine

## 2013-07-28 ENCOUNTER — Ambulatory Visit (INDEPENDENT_AMBULATORY_CARE_PROVIDER_SITE_OTHER): Payer: BC Managed Care – PPO | Admitting: Family Medicine

## 2013-07-28 ENCOUNTER — Encounter: Payer: Self-pay | Admitting: Family Medicine

## 2013-07-28 VITALS — BP 122/82 | HR 88 | Ht 70.0 in | Wt 178.0 lb

## 2013-07-28 DIAGNOSIS — Z23 Encounter for immunization: Secondary | ICD-10-CM

## 2013-07-28 DIAGNOSIS — I10 Essential (primary) hypertension: Secondary | ICD-10-CM

## 2013-07-28 DIAGNOSIS — F411 Generalized anxiety disorder: Secondary | ICD-10-CM

## 2013-07-28 DIAGNOSIS — E7800 Pure hypercholesterolemia, unspecified: Secondary | ICD-10-CM

## 2013-07-28 DIAGNOSIS — R7301 Impaired fasting glucose: Secondary | ICD-10-CM | POA: Insufficient documentation

## 2013-07-28 DIAGNOSIS — Z79899 Other long term (current) drug therapy: Secondary | ICD-10-CM

## 2013-07-28 LAB — LIPID PANEL: Total CHOL/HDL Ratio: 3.2 Ratio

## 2013-07-28 LAB — HEPATIC FUNCTION PANEL
AST: 24 U/L (ref 0–37)
Albumin: 4.9 g/dL (ref 3.5–5.2)
Alkaline Phosphatase: 74 U/L (ref 39–117)
Total Protein: 7.3 g/dL (ref 6.0–8.3)

## 2013-07-28 MED ORDER — ALPRAZOLAM 0.5 MG PO TABS: 0.2500 mg | ORAL_TABLET | Freq: Three times a day (TID) | ORAL | Status: DC | PRN

## 2013-07-28 MED ORDER — ATORVASTATIN CALCIUM 20 MG PO TABS: ORAL_TABLET | ORAL | Status: DC

## 2013-07-28 MED ORDER — LISINOPRIL-HYDROCHLOROTHIAZIDE 10-12.5 MG PO TABS: ORAL_TABLET | ORAL | Status: DC

## 2013-07-28 NOTE — Progress Notes (Signed)
Chief Complaint  Patient presents with  . Hypertension    fasting med check.    Hypertension follow-up:  Blood pressures from memory on monitor from home read:  96/67, 121/81, 113/80, 92/63, 132/80, 120/77, 138/91, 140/80, 101/59, 123/75, 142/88, 111/67.  A few times he has felt dizzy with standing too quickly.  Can't recall if he has any symptoms the times systolic BP<100.  Denies headaches, chest pain, edema, shortness of breath.  Denies side effects of medications. Only very occasional cough.  Hyperlipidemia follow-up:  Patient is reportedly following a low-fat, low cholesterol diet.  Compliant with medications and denies medication side effects  Recent urology check up for prostate cancer (1.5 months ago) was good--to f/u in 6 months.  Going to [place] tomorrow.  Asking for anxiety medication for flying.  Upon further discussion, he reports that he has had increased anxiety, and even some panic panic attacks.  Mostly work stress, causing some irritability.  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  Feeling very stressed, irritable.  Exercise helps, but can't get much during the week, when at work and feeling anxious.  Past Medical History  Diagnosis Date  . Hyperlipidemia   . Colonic polyp 2007, 2013  . Prostate cancer 03/24/12    gleason 6, vol 31.3 cc  . Hypertension   . Nocturia    Past Surgical History  Procedure Laterality Date  . Biopsy prostate  03/24/12    gleason 3+3=6/prostate volume 31 cc's  (md office)  . Radioactive seed implant  08/13/2012    Procedure: RADIOACTIVE SEED IMPLANT;  Surgeon: Garnett Farm, MD;  Location: [facility];  Service: Urology;  Laterality: N/A;   79    seeds implanted  . Cystoscopy  08/13/2012    Procedure: CYSTOSCOPY FLEXIBLE;  Surgeon: Garnett Farm, MD;  Location: [facility];  Service: Urology;  Laterality: N/A;  no seeds found in bladder   History   Social History  .  Marital Status: Married    Spouse Name: N/A    Number of Children: N/A  . Years of Education: N/A   Occupational History  . Not on file.   Social History Main Topics  . Smoking status: Never Smoker   . Smokeless tobacco: Never Used  . Alcohol Use: 8.4 oz/week    14 Glasses of wine per week     Comment: daily 2-3 drinks per day prior to dinner  . Drug Use: No  . Sexual Activity: Yes    Partners: Female   Other Topics Concern  . Not on file   Social History Narrative   Lives with wife, no pets.  2 children and 1 stepchild.  Daughters in [place], stepson in [place]. 1 granddaughter    Current Outpatient Prescriptions on File Prior to Visit  Medication Sig Dispense Refill  . atorvastatin (LIPITOR) 20 MG tablet TAKE 1 TABLET (20 MG TOTAL) BY MOUTH DAILY.  30 tablet  4  . Doxylamine Succinate, Sleep, (UNISOM PO) Take 1 capsule by mouth as needed.       Marland Kitchen lisinopril-hydrochlorothiazide (PRINZIDE,ZESTORETIC) 10-12.5 MG per tablet TAKE 1 TABLET BY MOUTH DAILY.  30 tablet  98   No current facility-administered medications on file prior to visit.   No Known Allergies  ROS:  Denies fevers, URI symptoms, allergies, chest pain, palpitations, headaches, shortness of breath.  Denies GI complaints.  Urine flow is normal, up twice at night; no  erectile problems. No edema, bleeding/bruising, rashes.  Denies depresion.  +anxiety and insomnia.  Unisom helps.  PHYSICAL EXAM: BP 146/84  Pulse 88  Ht 5\' 10"  (1.778 m)  Wt 178 lb (80.74 kg)  BMI 25.54 kg/m2 Pt's monitor read 151/89 after check by nurse. 122/82 RA on repeat by MD;  122/77 on pt's monitor shortly after Well developed, pleasant male in no distress  Neck: no lymphadenopathy, thyromegaly or mass  Heart: regular rate and rhythm without murmur  Lungs: clear bilaterally  Abdomen: soft, nontender, no mass  Extremities: no edema  Skin: no rash  Psych: normal mood, affect, hygiene and grooming   Lab Results  Component Value Date   HGBA1C  5.2 07/28/2013    ASSESSMENT/PLAN:  Essential hypertension, benign - white coat component.  his machine is accurate and BP reading at home are acceptable.  continue current medication     - Plan: lisinopril-hydrochlorothiazide (PRINZIDE,ZESTORETIC) 10-12.5 MG per tablet  Need for prophylactic vaccination and inoculation against influenza - Plan: Flu Vaccine QUAD 36+ mos IM  IFG (impaired fasting glucose) - diet/exercise reviewed.  A1c normal, await fasting glucose - Plan: HgB A1c, Glucose, random  Pure hypercholesterolemia - continue low cholesterol diet, meds; lipids ordered - Plan: Hepatic function panel, Lipid panel, atorvastatin (LIPITOR) 20 MG tablet  Encounter for long-term (current) use of other medications - Plan: Hepatic function panel  Anxiety state, unspecified - infrequent panic attacks. use xanax prn (risks/side effects reviewed); return if needing frequently to discuss preventative meds; discussed stress reduction - Plan: ALPRAZolam (XANAX) 0.5 MG tablet   Keep a list of your blood pressures, with date/BP/pulse/comment section--this is where you can write if you felt dizzy, had headache, high salt diet--explanation of highs/low readings.  Make sure that you are drinking plenty of fluids, especially with exercise.  If having worsening frequent urination, we can consider getting rid of the diuretic and raising the lisinopril.  Anxiety--related to work, as well as [employer], [employer].  Counseled re: stress reduction.  Counseled re: risks/side effects of alprazolam, and discussed potential for daily meds if ongoing anxiety, frequent use of benzo. Return for further discussion/treatment of anxiety if ongoing symptoms of anxiety, frequent use of xanax

## 2013-07-28 NOTE — Patient Instructions (Signed)
Keep a list of your blood pressures, with date/BP/pulse/comment section--this is where you can write if you felt dizzy, had headache, high salt diet--explanation of highs/low readings.  Make sure that you are drinking plenty of fluids, especially with exercise.  If having worsening frequent urination, we can consider getting rid of the diuretic and raising the lisinopril.  If you find that you are needing to take the alprazolam frequently to help with anxiety, you might benefit from being on a preventative medication.  Come back to discuss in more detail if this is the case.  Continue stress reduction techniques.  Use 1/2 tablet during the day, full tablet in the evening or with flying, when you don't need to be driving.  Limit alcohol when using this medication.

## 2013-07-29 ENCOUNTER — Encounter: Payer: Self-pay | Admitting: Family Medicine

## 2013-09-07 ENCOUNTER — Other Ambulatory Visit: Payer: Self-pay | Admitting: Family Medicine

## 2013-09-07 NOTE — Telephone Encounter (Signed)
See if he really needs this (or if was auto-refill).  We had discussed prn use.  If he truly is needing this almost daily, needs to schedule OV

## 2013-09-07 NOTE — Telephone Encounter (Signed)
Is this okay to refill? 

## 2013-11-09 ENCOUNTER — Ambulatory Visit (INDEPENDENT_AMBULATORY_CARE_PROVIDER_SITE_OTHER): Payer: BC Managed Care – PPO | Admitting: Family Medicine

## 2013-11-09 ENCOUNTER — Encounter: Payer: Self-pay | Admitting: Family Medicine

## 2013-11-09 VITALS — BP 142/90 | HR 76 | Ht 70.0 in | Wt 181.0 lb

## 2013-11-09 DIAGNOSIS — K219 Gastro-esophageal reflux disease without esophagitis: Secondary | ICD-10-CM

## 2013-11-09 DIAGNOSIS — R079 Chest pain, unspecified: Secondary | ICD-10-CM

## 2013-11-09 NOTE — Patient Instructions (Signed)
Take the Prilosec OTC 2 capsules daily for the next week, then if symptoms have completely resolved, try taking just one daily.  If symptoms recur, go back up to 2/day and call me for a prescription.  If you do fine with just 1/day, then see what happens when you stop it.  You might need to use it just before meals which are likely to trigger reflux (see below, as discussed).  Gastroesophageal Reflux Disease, Adult Gastroesophageal reflux disease (GERD) happens when acid from your stomach flows up into the esophagus. When acid comes in contact with the esophagus, the acid causes soreness (inflammation) in the esophagus. Over time, GERD may create small holes (ulcers) in the lining of the esophagus. CAUSES   Increased body weight. This puts pressure on the stomach, making acid rise from the stomach into the esophagus.  Smoking. This increases acid production in the stomach.  Drinking alcohol. This causes decreased pressure in the lower esophageal sphincter (valve or ring of muscle between the esophagus and stomach), allowing acid from the stomach into the esophagus.  Late evening meals and a full stomach. This increases pressure and acid production in the stomach.  A malformed lower esophageal sphincter. Sometimes, no cause is found. SYMPTOMS   Burning pain in the lower part of the mid-chest behind the breastbone and in the mid-stomach area. This may occur twice a week or more often.  Trouble swallowing.  Sore throat.  Dry cough.  Asthma-like symptoms including chest tightness, shortness of breath, or wheezing. DIAGNOSIS  Your caregiver may be able to diagnose GERD based on your symptoms. In some cases, X-rays and other tests may be done to check for complications or to check the condition of your stomach and esophagus. TREATMENT  Your caregiver may recommend over-the-counter or prescription medicines to help decrease acid production. Ask your caregiver before starting or adding any new  medicines.  HOME CARE INSTRUCTIONS   Change the factors that you can control. Ask your caregiver for guidance concerning weight loss, quitting smoking, and alcohol consumption.  Avoid foods and drinks that make your symptoms worse, such as:  Caffeine or alcoholic drinks.  Chocolate.  Peppermint or mint flavorings.  Garlic and onions.  Spicy foods.  Citrus fruits, such as oranges, lemons, or limes.  Tomato-based foods such as sauce, chili, salsa, and pizza.  Fried and fatty foods.  Avoid lying down for the 3 hours prior to your bedtime or prior to taking a nap.  Eat small, frequent meals instead of large meals.  Wear loose-fitting clothing. Do not wear anything tight around your waist that causes pressure on your stomach.  Raise the head of your bed 6 to 8 inches with wood blocks to help you sleep. Extra pillows will not help.  Only take over-the-counter or prescription medicines for pain, discomfort, or fever as directed by your caregiver.  Do not take aspirin, ibuprofen, or other nonsteroidal anti-inflammatory drugs (NSAIDs). SEEK IMMEDIATE MEDICAL CARE IF:   You have pain in your arms, neck, jaw, teeth, or back.  Your pain increases or changes in intensity or duration.  You develop nausea, vomiting, or sweating (diaphoresis).  You develop shortness of breath, or you faint.  Your vomit is green, yellow, black, or looks like coffee grounds or blood.  Your stool is red, bloody, or black. These symptoms could be signs of other problems, such as heart disease, gastric bleeding, or esophageal bleeding. MAKE SURE YOU:   Understand these instructions.  Will watch your condition.  Will get help right away if you are not doing well or get worse. Document Released: 07/23/2005 Document Revised: 01/05/2012 Document Reviewed: 05/02/2011 [facility] Patient Information 2014 [place], [place].

## 2013-11-09 NOTE — Progress Notes (Signed)
Chief Complaint  Patient presents with  . indigestion    started last wednesday through, saturday. doctor on call here told him to double up on prilosec. no heartburn, no chest pain. was feeling better yesterday but then woke up at 3am having bluging, gas, bloating feeling fluttering   Started with indigestion 1 week ago--"nervous stomach", right below the sternum, feeling bloated, +belching.  Denies heartburn. Also having a lot of "noisy stomach".  When lying flat, he feels a fluttery sensation in the upper abdomen. Symptoms would come and go.  He had decreased appetite, so was eating soup. He thought it might have been a virus.  He felt worse when lying down, better when he would get up and walk around. Did better when he propped his head up with pillows.  4 days ago he spoke to Dr. Redmond School, who recommended Prilosec OTC, and took 2/day over Sat, Sun and Mon.  He felt 98% better yesterday.  He missed yesterday's dose (he was feeling better), then woke up at 3am this morning with recurrent symptoms (not as bad as initial symptoms). He split a bottle of red wine last night with his wife. He denies any exertional symptoms.  He denies chest pain, numbness or any other symptoms.   Consuming more alcohol and exercising less due to stress recently.  Past Medical History  Diagnosis Date  . Hyperlipidemia   . Colonic polyp 2007, 2013  . Prostate cancer 03/24/12    gleason 6, vol 31.3 cc  . Hypertension   . Nocturia    Past Surgical History  Procedure Laterality Date  . Biopsy prostate  03/24/12    gleason 3+3=6/prostate volume 31 cc's  (md office)  . Radioactive seed implant  08/13/2012    Procedure: RADIOACTIVE SEED IMPLANT;  Surgeon: Claybon Jabs, MD;  Location: [facility];  Service: Urology;  Laterality: N/A;   79    seeds implanted  . Cystoscopy  08/13/2012    Procedure: CYSTOSCOPY FLEXIBLE;  Surgeon: Claybon Jabs, MD;  Location: [facility];  Service: Urology;   Laterality: N/A;  no seeds found in bladder   History   Social History  . Marital Status: Married    Spouse Name: N/A    Number of Children: N/A  . Years of Education: N/A   Occupational History  . Not on file.   Social History Main Topics  . Smoking status: Never Smoker   . Smokeless tobacco: Never Used  . Alcohol Use: 8.4 oz/week    14 Glasses of wine per week     Comment: daily 2-3 drinks per day prior to dinner  . Drug Use: No  . Sexual Activity: Yes    Partners: Female   Other Topics Concern  . Not on file   Social History Narrative   Lives with wife, no pets.  2 children and 1 stepchild.  Daughters in [place], stepson in [place]. 1 granddaughter   Current outpatient prescriptions:atorvastatin (LIPITOR) 20 MG tablet, TAKE 1 TABLET (20 MG TOTAL) BY MOUTH DAILY., Disp: 30 tablet, Rfl: 5;  lisinopril-hydrochlorothiazide (PRINZIDE,ZESTORETIC) 10-12.5 MG per tablet, TAKE 1 TABLET BY MOUTH DAILY., Disp: 30 tablet, Rfl: 5;  omeprazole (PRILOSEC) 20 MG capsule, Take 20 mg by mouth 2 (two) times daily., Disp: , Rfl:  ALPRAZolam (XANAX) 0.5 MG tablet, TAKE 1/2(ONE-HALF) TO 1 TABLET BY MOUTH THREE TIMES DAILY AS NEEDED SLEEP OR ANXIETY, Disp: 30 tablet, Rfl: 0;  Doxylamine Succinate, Sleep, (UNISOM PO), Take 1 capsule  by mouth as needed. , Disp: , Rfl:   No Known Allergies  ROS:  Denies fevers, chills, nausea, vomiting, bowel changes, abdominal pain.  Denies URI symptoms, cough, shortness of breath, bleeding/bruising, rashes.  See HPI  PHYSICAL EXAM: BP 142/90  Pulse 76  Ht 5\' 10"  (1.778 m)  Wt 181 lb (82.101 kg)  BMI 25.97 kg/m2 Well developed, pleasant male, in no distress.  He reports having some mild epigastric "fluttering" and symptoms during visit Neck: no lymphadenopathy, thyromegaly or mass Heart: regular rate and rhythm without murmur, rub, gallop Lungs: clear bilaterally Abdomen: soft.  Normal bowel sounds.  No epigastric tenderness, organomegaly or mass Extremities; no  edema Psych: normal mood, affect Neuro: alert and oriented   EKG: NSR, rate 70.  Borderline LAE, otherwise normal, with no acute changes.  ASSESSMENT/PLAN:  GERD (gastroesophageal reflux disease)  Chest pain - Plan: EKG 12-Lead  Counseled re: differential diagnosis, as well as causes, risks and complications of reflux and PPI's. Diet and behavioral changes reviewed.  Pt advised to: Continue for 40mg  of the omeprazole (2 of the OTC capsules) for another week.  If your symptoms have resolved by that time, then cut back to just one capsule daily.  If you have recurrent symptoms on 20mg  daily, call me for a prescription of the 40mg  omeprazole.  Also discussed prn use, prior to meals that seem to trigger reflux.

## 2013-11-14 ENCOUNTER — Other Ambulatory Visit: Payer: Self-pay | Admitting: *Deleted

## 2013-11-14 ENCOUNTER — Telehealth: Payer: Self-pay | Admitting: Internal Medicine

## 2013-11-14 MED ORDER — ALPRAZOLAM 0.5 MG PO TABS: ORAL_TABLET | ORAL | Status: DC

## 2013-11-14 NOTE — Telephone Encounter (Signed)
Ok for #30, no refill 

## 2013-11-14 NOTE — Telephone Encounter (Signed)
Refill request for alprazolam 0.5mg  to [facility]

## 2013-11-14 NOTE — Telephone Encounter (Signed)
Done

## 2014-01-11 ENCOUNTER — Other Ambulatory Visit: Payer: Self-pay | Admitting: Family Medicine

## 2014-01-11 NOTE — Telephone Encounter (Signed)
Last filled #30 2 mos ago.  Will keep an eye on frequency of use. Okay to refill #30, no refill

## 2014-01-11 NOTE — Telephone Encounter (Signed)
Is this okay to call in? 

## 2014-01-26 ENCOUNTER — Ambulatory Visit (INDEPENDENT_AMBULATORY_CARE_PROVIDER_SITE_OTHER): Payer: BC Managed Care – PPO | Admitting: Family Medicine

## 2014-01-26 ENCOUNTER — Encounter: Payer: Self-pay | Admitting: Family Medicine

## 2014-01-26 VITALS — BP 138/90 | HR 72 | Ht 70.0 in | Wt 180.0 lb

## 2014-01-26 DIAGNOSIS — K219 Gastro-esophageal reflux disease without esophagitis: Secondary | ICD-10-CM

## 2014-01-26 DIAGNOSIS — Z0000 Encounter for general adult medical examination without abnormal findings: Secondary | ICD-10-CM

## 2014-01-26 DIAGNOSIS — M79609 Pain in unspecified limb: Secondary | ICD-10-CM

## 2014-01-26 DIAGNOSIS — C61 Malignant neoplasm of prostate: Secondary | ICD-10-CM

## 2014-01-26 DIAGNOSIS — I10 Essential (primary) hypertension: Secondary | ICD-10-CM

## 2014-01-26 DIAGNOSIS — E7800 Pure hypercholesterolemia, unspecified: Secondary | ICD-10-CM

## 2014-01-26 DIAGNOSIS — R7301 Impaired fasting glucose: Secondary | ICD-10-CM

## 2014-01-26 DIAGNOSIS — M79676 Pain in unspecified toe(s): Secondary | ICD-10-CM

## 2014-01-26 LAB — POCT URINALYSIS DIPSTICK
Bilirubin, UA: NEGATIVE
Blood, UA: NEGATIVE
GLUCOSE UA: NEGATIVE
KETONES UA: NEGATIVE
Leukocytes, UA: NEGATIVE
Nitrite, UA: NEGATIVE
Protein, UA: NEGATIVE
Spec Grav, UA: <=1.005
Urobilinogen, UA: NEGATIVE
pH, UA: 6

## 2014-01-26 LAB — URIC ACID: Uric Acid, Serum: 7.8 mg/dL (ref 4.0–7.8)

## 2014-01-26 LAB — CBC WITH DIFFERENTIAL/PLATELET
BASOS PCT: 1 % (ref 0–1)
Basophils Absolute: 0.1 10*3/uL (ref 0.0–0.1)
Eosinophils Absolute: 0.1 10*3/uL (ref 0.0–0.7)
Eosinophils Relative: 1 % (ref 0–5)
HEMATOCRIT: 45.8 % (ref 39.0–52.0)
HEMOGLOBIN: 16 g/dL (ref 13.0–17.0)
Lymphocytes Relative: 24 % (ref 12–46)
Lymphs Abs: 1.6 10*3/uL (ref 0.7–4.0)
MCH: 30.4 pg (ref 26.0–34.0)
MCHC: 34.9 g/dL (ref 30.0–36.0)
MCV: 87.1 fL (ref 78.0–100.0)
MONO ABS: 0.5 10*3/uL (ref 0.1–1.0)
MONOS PCT: 8 % (ref 3–12)
Neutro Abs: 4.5 10*3/uL (ref 1.7–7.7)
Neutrophils Relative %: 66 % (ref 43–77)
Platelets: 340 10*3/uL (ref 150–400)
RBC: 5.26 MIL/uL (ref 4.22–5.81)
RDW: 13.7 % (ref 11.5–15.5)
WBC: 6.8 10*3/uL (ref 4.0–10.5)

## 2014-01-26 LAB — COMPREHENSIVE METABOLIC PANEL
ALK PHOS: 74 U/L (ref 39–117)
ALT: 51 U/L (ref 0–53)
AST: 26 U/L (ref 0–37)
Albumin: 4.8 g/dL (ref 3.5–5.2)
BUN: 15 mg/dL (ref 6–23)
CALCIUM: 10.1 mg/dL (ref 8.4–10.5)
CHLORIDE: 96 meq/L (ref 96–112)
CO2: 30 mEq/L (ref 19–32)
CREATININE: 0.92 mg/dL (ref 0.50–1.35)
GLUCOSE: 102 mg/dL — AB (ref 70–99)
Potassium: 4.3 mEq/L (ref 3.5–5.3)
Sodium: 134 mEq/L — ABNORMAL LOW (ref 135–145)
Total Bilirubin: 0.9 mg/dL (ref 0.2–1.2)
Total Protein: 7.5 g/dL (ref 6.0–8.3)

## 2014-01-26 LAB — LIPID PANEL
CHOL/HDL RATIO: 3.3 ratio
CHOLESTEROL: 218 mg/dL — AB (ref 0–200)
HDL: 66 mg/dL (ref >39)
LDL Cholesterol: 136 mg/dL — ABNORMAL HIGH (ref 0–99)
TRIGLYCERIDES: 82 mg/dL (ref <150)
VLDL: 16 mg/dL (ref 0–40)

## 2014-01-26 LAB — HEMOGLOBIN A1C
Hgb A1c MFr Bld: 5.8 % — ABNORMAL HIGH (ref <5.7)
MEAN PLASMA GLUCOSE: 120 mg/dL — AB (ref <117)

## 2014-01-26 MED ORDER — OMEPRAZOLE 40 MG PO CPDR: 40.0000 mg | DELAYED_RELEASE_CAPSULE | Freq: Every day | ORAL | Status: DC

## 2014-01-26 MED ORDER — ATORVASTATIN CALCIUM 20 MG PO TABS: ORAL_TABLET | ORAL | Status: DC

## 2014-01-26 MED ORDER — LISINOPRIL-HYDROCHLOROTHIAZIDE 10-12.5 MG PO TABS: ORAL_TABLET | ORAL | Status: DC

## 2014-01-26 NOTE — Progress Notes (Signed)
Chief Complaint  Patient presents with  . Annual Exam    fasting annual exam. DId not do eye exam as he has one scheduled. Would like to discuss a new problem-he thinks he may have had a gout episode (toe on right foot). Also wantd to discuss omeprazole-was trying to wean himself off or at least skip a day or two but finds he must take daily to be effective.    Jeremy Bush is a 58 y.o. male who presents for a complete physical.  He is also here for a routine med check.  He has the following concerns:  Woke up about a week ago and the right 3rd toe was painful.  It was mild, but fairly constant.  The next night it got worse, it got red, swollen, and so painful he couldn't even touch it.  The swelling spread into the forefoot.  Took about 5-6 days for it to resolve.  He took some Aleve, which helped.  No known trauma or sting/bite.  He states that the skin looked very inflamed, almost like a blister. It hurt more to bend the toe, but very sensitive even to the touch.  Hypertension follow-up:  BP's at home in the evening are running consistently 110/70. In the mornings it is higher, usually 130/85, although this morning was 140/90.  It is okay if/when he checks it in the afternoon.  It is also good when he checks it periodically at [employer]. He denies any headaches, chest pain, palpitations, or side effects of meds.  Hyperlipidemia follow-up:  Patient is reportedly following a low-fat, low cholesterol diet.  Compliant with medications and denies medication side effects  GERD:  He was seen in January with reflux, and started on Omeprazole. He took $Remov'40mg'dgPCmw$  for a while, then decreased dose down to $Remov'20mg'xASsQA$  daily.  He then tapered to qod, but started to have some mild recurrent symptoms, so would go back to daily dosing, and doesn't get complete relief unless he takes $Remove'40mg'OUOwBRA$ .  Seems to be worse when he eats late dinners.  He admits to liking spicy foods. Symptoms are mainly at night.  He has elevated the  head of his bed.  IFG: last checked in October, sugar 108, A1c 5.2.  Prostate cancer--under care of Dr. Karsten Ro.  S/p radioactive seed implant.  Last seen within 2 months.  He was told he was pretty much cured (PSA<1).  Denies ED or other side effects. Urinary frequency resolved, back to normal.  Gets up once to void at 4am.  Never saw dermatologist for eval of rough skin on ears and skin check--used some Nivea cream and rough areas on his ears completely resolved.  Immunization History  Administered Date(s) Administered  . Influenza Split 07/22/2011, 07/29/2012  . Influenza,inj,Quad PF,36+ Mos 07/28/2013  . Td 09/15/2005  . Tdap 01/13/2013   Last colonoscopy: 04/2012 Dr. Michail Sermon (polyps), q5 years  Last PSA: recent per urologist  Dentist: 3-4 times yearly  Ophtho: yearly, scheduled Exercise: Mostly just on weekends (elliptical and treadmill), 30 minutes cardio and weights.  Started walking more, just got a puppy  Past Medical History  Diagnosis Date  . Hyperlipidemia   . Colonic polyp 2007, 2013  . Prostate cancer 03/24/12    gleason 6, vol 31.3 cc  . Hypertension   . Nocturia     Past Surgical History  Procedure Laterality Date  . Biopsy prostate  03/24/12    gleason 3+3=6/prostate volume 31 cc's  (md office)  . Radioactive seed  implant  08/13/2012    Procedure: RADIOACTIVE SEED IMPLANT;  Surgeon: Claybon Jabs, MD;  Location: [facility];  Service: Urology;  Laterality: N/A;   79    seeds implanted  . Cystoscopy  08/13/2012    Procedure: CYSTOSCOPY FLEXIBLE;  Surgeon: Claybon Jabs, MD;  Location: [facility];  Service: Urology;  Laterality: N/A;  no seeds found in bladder    History   Social History  . Marital Status: Married    Spouse Name: N/A    Number of Children: N/A  . Years of Education: N/A   Occupational History  . Not on file.   Social History Main Topics  . Smoking status: Never Smoker   . Smokeless tobacco: Never Used   . Alcohol Use: 8.4 oz/week    14 Glasses of wine per week     Comment: daily 2-3 drinks per day prior to dinner  . Drug Use: No  . Sexual Activity: Yes    Partners: Female   Other Topics Concern  . Not on file   Social History Narrative   Lives with wife, no pets.  2 children and 1 stepchild.  Daughters in [place], stepson in [place]. 1 granddaughter    Family History  Problem Relation Age of Onset  . Cancer Mother     lung (smoker)  . Hypertension Mother   . Hyperlipidemia Mother   . Stroke Father     mini-stroke  . Hypertension Father   . Kidney disease Father     on dialysis  . Diabetes Father   . Healthy Sister    Outpatient Encounter Prescriptions as of 01/26/2014  Medication Sig Note  . ALPRAZolam (XANAX) 0.5 MG tablet TAKE 1/2 (ONE-HALF) TO 1 TABLET BY MOUTH THREE TIMES DAILY AS NEEDED SLEEP OR ANXIETY 01/26/2014: 1/2 tablet in the morning during workweek (M-F); rarely needs full tablet.  #30 usually lasts 60d.  Doesn't take at night  . atorvastatin (LIPITOR) 20 MG tablet TAKE 1 TABLET (20 MG TOTAL) BY MOUTH DAILY.   Marland Kitchen lisinopril-hydrochlorothiazide (PRINZIDE,ZESTORETIC) 10-12.5 MG per tablet TAKE 1 TABLET BY MOUTH DAILY.   . [DISCONTINUED] atorvastatin (LIPITOR) 20 MG tablet TAKE 1 TABLET (20 MG TOTAL) BY MOUTH DAILY.   . [DISCONTINUED] lisinopril-hydrochlorothiazide (PRINZIDE,ZESTORETIC) 10-12.5 MG per tablet TAKE 1 TABLET BY MOUTH DAILY.   . [DISCONTINUED] omeprazole (PRILOSEC) 20 MG capsule Take 20 mg by mouth 2 (two) times daily.   Marland Kitchen omeprazole (PRILOSEC) 40 MG capsule Take 1 capsule (40 mg total) by mouth daily.   . [DISCONTINUED] Doxylamine Succinate, Sleep, (UNISOM PO) Take 1 capsule by mouth as needed.     No Known Allergies  ROS: The patient denies anorexia, fever, weight changes, headaches, vision loss, decreased hearing, ear pain, hoarseness, chest pain, palpitations, dizziness, syncope, dyspnea on exertion, cough, swelling, nausea, vomiting, diarrhea, constipation,  abdominal pain, melena, hematochezia, hematuria, incontinence, erectile dysfunction, nocturia, weakened urine stream, dysuria, genital lesions, joint pains, numbness, tingling, weakness, tremor, suspicious skin lesions, depression, anxiety, abnormal bleeding/bruising, or enlarged lymph nodes.  Slight runny nose/allergies. +reflux at night as per HPI   PHYSICAL EXAM:  BP 168/102  Pulse 72  Ht $R'5\' 10"'za$  (1.778 m)  Wt 180 lb (81.647 kg)  BMI 25.83 kg/m2 138/90 on repeat by MD, RA,  General Appearance:  Alert, cooperative, no distress, appears stated age   Head:  Normocephalic, without obvious abnormality, atraumatic   Eyes:  PERRL, conjunctiva/corneas clear, EOM's intact, fundi  benign   Ears:  Normal TM's and external ear canals   Nose:  Nares normal, mucosa normal, no drainage or sinus tenderness   Throat:  Lips, mucosa, and tongue normal; teeth and gums normal   Neck:  Supple, no lymphadenopathy; thyroid: no enlargement/tenderness/nodules; no carotid  bruit or JVD   Back:  Spine nontender, no curvature, ROM normal, no CVA tenderness   Lungs:  Clear to auscultation bilaterally without wheezes, rales or ronchi; respirations unlabored   Chest Wall:  No tenderness or deformity   Heart:  Regular rate and rhythm, S1 and S2 normal, no murmur, rub  or gallop   Breast Exam:  No chest wall tenderness, masses or gynecomastia   Abdomen:  Soft, non-tender, nondistended, normoactive bowel sounds,  no masses, no hepatosplenomegaly   Genitalia:  Deferred to urologist   Rectal:  Deferred to urologist   Extremities:  No clubbing, cyanosis or edema   Pulses:  2+ and symmetric all extremities   Skin:  Skin color, texture, turgor normal. Ears are smooth.  No suspicious lesions.  Right 3rd toe--at PIP joint, there is slight pinkness, and peeling skin around the periphery (like a blister had opened)  Lymph nodes:  Cervical, supraclavicular, and axillary nodes normal   Neurologic:  CNII-XII intact, normal  strength, sensation and gait; reflexes 2+ and symmetric throughout   Psych: Normal mood, affect, hygiene and grooming.    ASSESSMENT/PLAN:  Routine general medical examination at a health care facility - Plan: POCT Urinalysis Dipstick, Lipid panel, Comprehensive metabolic panel, CBC with Differential, TSH  Pure hypercholesterolemia - Plan: Lipid panel, Comprehensive metabolic panel, atorvastatin (LIPITOR) 20 MG tablet  Essential hypertension, benign - clearly has white coat component.  BP's are checked frequently with an accurate monitor, and are fine.  - Plan: Comprehensive metabolic panel, lisinopril-hydrochlorothiazide (PRINZIDE,ZESTORETIC) 10-12.5 MG per tablet  Adenocarcinoma of prostate - per Dr. Karsten Ro, doing well - Plan: CBC with Differential  GERD (gastroesophageal reflux disease) - reviewed reflux precautions.  Change to $RemoveB'40mg'HYwqhTem$  omeprazole, plan to take 3-6 months, then consider decreasing to $RemoveBefor'20mg'DJycltijusWQ$ .  Ok to continue $RemoveBef'40mg'wqBUygyYCj$  if needed - Plan: omeprazole (PRILOSEC) 40 MG capsule  IFG (impaired fasting glucose) - recheck today.  If A1c remains <5.7, prob can just check once yearly or less often if sugars are <100 - Plan: Hemoglobin A1c  Toe pain - the appearance of peeling skin isn't typical of a healing gout flare.  discussed differential. check uric acid.  diet briefly reviewed, NSAIDs prn - Plan: CBC with Differential, Uric Acid  c-met, lipids, A1c, TSH, CBC, uric acid  HTN--white coat component with accurate machine  GERD--suboptimally controlled with OTC dosing.  Rx $R'40mg'wA$  to take daily for at least 3-6 months.  Okay to take for longer, if needed.  We discussed risks/benefits of untreated reflux vs chronic PPI's.  Consider taper in future to OTC dosing (when less stress, not eating as late at night).  ?gout vs other injury.   Recommended at least 30 minutes of aerobic activity at least 5 days/week; proper sunscreen use reviewed; healthy diet and alcohol recommendations (less than or  equal to 2 drinks/day) reviewed; regular seatbelt use; changing batteries in smoke detectors. Self-testicular exams. Immunization recommendations discussed--zostavax recommended. Risks/benefits reviewed. He will check with his insurance and schedule nurse visit.  Colonoscopy recommendations reviewed--UTD, due again 2018

## 2014-01-26 NOTE — Patient Instructions (Addendum)
  HEALTH MAINTENANCE RECOMMENDATIONS:  It is recommended that you get at least 30 minutes of aerobic exercise at least 5 days/week (for weight loss, you may need as much as 60-90 minutes). This can be any activity that gets your heart rate up. This can be divided in 10-15 minute intervals if needed, but try and build up your endurance at least once a week.  Weight bearing exercise is also recommended twice weekly.  Eat a healthy diet with lots of vegetables, fruits and fiber.  "Colorful" foods have a lot of vitamins (ie green vegetables, tomatoes, red peppers, etc).  Limit sweet tea, regular sodas and alcoholic beverages, all of which has a lot of calories and sugar.  Up to 2 alcoholic drinks daily may be beneficial for men (unless trying to lose weight, watch sugars).  Drink a lot of water.  Sunscreen of at least SPF 30 should be used on all sun-exposed parts of the skin when outside between the hours of 10 am and 4 pm (not just when at beach or pool, but even with exercise, golf, tennis, and yard work!)  Use a sunscreen that says "broad spectrum" so it covers both UVA and UVB rays, and make sure to reapply every 1-2 hours.  Remember to change the batteries in your smoke detectors when changing your clock times in the spring and fall.  Use your seat belt every time you are in a car, and please drive safely and not be distracted with cell phones and texting while driving.  GERD:  Take the 40mg  of omeprazole daily for at least 3-6 months.  Okay to take for longer, if needed.  We discussed risks/benefits of untreated reflux vs chronic PPI's.  Consider taper in future to OTC dosing (when less stress, not eating as late at night).  Take it before dinner.  Check with your insurance re: coverage of zostavax (shingles vaccine).  Call for nurse visit if/when interested (remember it must be 30 days separate from other vaccines)

## 2014-01-27 LAB — TSH: TSH: 1.67 u[IU]/mL (ref 0.350–4.500)

## 2014-02-19 ENCOUNTER — Other Ambulatory Visit: Payer: Self-pay | Admitting: Family Medicine

## 2014-02-21 ENCOUNTER — Encounter: Payer: Self-pay | Admitting: Family Medicine

## 2014-03-09 ENCOUNTER — Other Ambulatory Visit: Payer: Self-pay | Admitting: Family Medicine

## 2014-03-09 NOTE — Telephone Encounter (Signed)
Yes, okay for #30

## 2014-03-09 NOTE — Telephone Encounter (Signed)
Is this okay to call in? 

## 2014-05-08 ENCOUNTER — Other Ambulatory Visit: Payer: Self-pay | Admitting: Family Medicine

## 2014-05-08 NOTE — Telephone Encounter (Signed)
Is this okay to refill? 

## 2014-05-08 NOTE — Telephone Encounter (Signed)
Ok for #30 

## 2014-06-30 ENCOUNTER — Other Ambulatory Visit: Payer: Self-pay | Admitting: Family Medicine

## 2014-06-30 NOTE — Telephone Encounter (Signed)
[place] for #30, no rf

## 2014-06-30 NOTE — Telephone Encounter (Signed)
Is this okay to refill? 

## 2014-08-02 ENCOUNTER — Ambulatory Visit (INDEPENDENT_AMBULATORY_CARE_PROVIDER_SITE_OTHER): Payer: BC Managed Care – PPO | Admitting: Family Medicine

## 2014-08-02 ENCOUNTER — Encounter: Payer: Self-pay | Admitting: Family Medicine

## 2014-08-02 VITALS — BP 148/94 | HR 84 | Ht 70.0 in | Wt 189.0 lb

## 2014-08-02 DIAGNOSIS — I10 Essential (primary) hypertension: Secondary | ICD-10-CM

## 2014-08-02 DIAGNOSIS — E7800 Pure hypercholesterolemia, unspecified: Secondary | ICD-10-CM

## 2014-08-02 DIAGNOSIS — K219 Gastro-esophageal reflux disease without esophagitis: Secondary | ICD-10-CM

## 2014-08-02 DIAGNOSIS — Z23 Encounter for immunization: Secondary | ICD-10-CM

## 2014-08-02 DIAGNOSIS — R7301 Impaired fasting glucose: Secondary | ICD-10-CM

## 2014-08-02 MED ORDER — ATORVASTATIN CALCIUM 20 MG PO TABS: ORAL_TABLET | ORAL | Status: DC

## 2014-08-02 MED ORDER — LISINOPRIL-HYDROCHLOROTHIAZIDE 10-12.5 MG PO TABS: ORAL_TABLET | ORAL | Status: DC

## 2014-08-02 NOTE — Progress Notes (Signed)
Subjective:     Patient ID: Jeremy Bush, male   DOB: 02/01/1956, 58 y.o.   MRN: [id number]  Jeremy Bush presents for Hypertension  Hypertension follow-up: BP's at home in the evening are running consistently 110-120/70-80. In the mornings it is higher, usually 130-138/85-90.  BP is good when he checks it periodically at [employer]. He hasn't checked BP an hour or two after taking his medication (to see if BP's improve).  Morning BP check is when he first wakes up, the same time he takes his medication. He denies any headaches, chest pain, palpitations, or side effects of meds.   Hyperlipidemia follow-up: Patient is reportedly following a low-fat, low cholesterol diet. Compliant with medications and denies medication side effects. He was in [place] for 10 days, just returned earlier this week.  He admits that his diet was very poor while there--a lot of beef, cheese, liver, egg.  GERD: His dose of omeprazole was increased to 40mg  6 months ago. He has recurrent symptoms if he misses a couple of days.  Prior to that, he had tried tapering down, but had recurrent symptoms. Seems to be worse when he eats late dinners. He admits to liking spicy foods. Symptoms are only at night. He has elevated the head of his bed. Denies dysphagia.  IFG: Last labs were 6 months ago: sugar 102, A1c 5.8.  Possible gout episode over 6 months ago.  Uric acid level was 7.8.  He has not had any further problems.  Anxiety--takes 1/2 tablet daily on the days he works, and needed some to help him sleep at night when he was recently in [place]. He is in the process of buying a yacht.  Planning to work only 4 days/week. Plans to retire in 2016.  Past Medical History  Diagnosis Date  . Hyperlipidemia   . Colonic polyp 2007, 2013  . Prostate cancer 03/24/12    gleason 6, vol 31.3 cc  . Hypertension   . Nocturia    Past Surgical History  Procedure Laterality Date  . Biopsy prostate  03/24/12    gleason  3+3=6/prostate volume 31 cc's  (md office)  . Radioactive seed implant  08/13/2012    Procedure: RADIOACTIVE SEED IMPLANT;  Surgeon: Claybon Jabs, MD;  Location: [facility];  Service: Urology;  Laterality: N/A;   79    seeds implanted  . Cystoscopy  08/13/2012    Procedure: CYSTOSCOPY FLEXIBLE;  Surgeon: Claybon Jabs, MD;  Location: [facility];  Service: Urology;  Laterality: N/A;  no seeds found in bladder   History   Social History  . Marital Status: Married    Spouse Name: N/A    Number of Children: N/A  . Years of Education: N/A   Occupational History  . Not on file.   Social History Main Topics  . Smoking status: Never Smoker   . Smokeless tobacco: Never Used  . Alcohol Use: 8.4 oz/week    14 Glasses of wine per week     Comment: daily 2-3 drinks per day prior to dinner  . Drug Use: No  . Sexual Activity: Yes    Partners: Female   Other Topics Concern  . Not on file   Social History Narrative   Lives with wife, no pets.  2 children and 1 stepchild.  Daughters in [place], [place] in [place]. 1 granddaughter    Outpatient Encounter Prescriptions as of 08/02/2014  Medication Sig  . ALPRAZolam (XANAX) 0.5 MG tablet  TAKE 1/2 (ONE-HALF) TO 1 TABLET BY MOUTH THREE TIMES DAILY AS NEEDED SLEEP OR ANXIETY  . atorvastatin (LIPITOR) 20 MG tablet TAKE 1 TABLET (20 MG TOTAL) BY MOUTH DAILY.  Marland Kitchen lisinopril-hydrochlorothiazide (PRINZIDE,ZESTORETIC) 10-12.5 MG per tablet TAKE 1 TABLET BY MOUTH DAILY.  Marland Kitchen omeprazole (PRILOSEC) 40 MG capsule Take 1 capsule (40 mg total) by mouth daily.  . [DISCONTINUED] atorvastatin (LIPITOR) 20 MG tablet TAKE 1 TABLET (20 MG TOTAL) BY MOUTH DAILY.    No Known Allergies  Review of Systems  Constitutional: Negative.   HENT: Negative.        Very mild postnasal drip started this week  Eyes: Negative.   Respiratory: Negative.   Cardiovascular: Negative.   Gastrointestinal: Negative.   Genitourinary: Negative.    Musculoskeletal: Negative.        Infrequent stiff neck, which he relates to stress  Neurological: Negative.   Hematological: Negative.   Psychiatric/Behavioral: Negative.        Objective:     BP 148/94  Pulse 84  Ht 5\' 10"  (1.778 m)  Wt 189 lb (85.73 kg)  BMI 27.12 kg/m2  138/92 while in waiting room on pt's machine. 158/97 on machine while in exam room (same time that nurse checked it). Went up to 181/117 when checked (by accident) later in the visit. 142/98 on repeat by MD later in visit   Physical Exam  Well developed, pleasant male in no distress  HEENT:  Nasal mucosa is mildly edematous with clear mucus. OP is clear. Neck: no lymphadenopathy, thyromegaly or mass, no carotid bruit. Heart: regular rate and rhythm without murmur  Lungs: clear bilaterally  Abdomen: soft, nontender, no mass, no organomegaly Extremities: no edema, 2+ pulses Skin: no rash  Psych: normal mood, affect, hygiene and grooming Neuro: alert and oriented.  Normal strength, sensation, gait; cranial nerves grossly intact     Assessment and Plan        1. Essential hypertension, benign  Significant white coat component to his blood pressure. Well controlled in the evenings.  Elevated in mornings, prior to taking meds.  Recommended checking 1-2 hours later.  2. Pure hypercholesterolemia  May be higher than usual due to recent trip with poor diet.  3. Gastroesophageal reflux disease without esophagitis  Well controlled.  Reviewed risks of longterm PPI (vs untreated reflux).  4. IFG (impaired fasting glucose)   F/u 6 months at CPE

## 2014-08-02 NOTE — Patient Instructions (Signed)
Continue your same medications. Periodically check your blood pressure later in the morning (1-2 hours after taking your medication).  Consider taking a multivitamin or calcium with magnesium since you take a proton pump inhibitor daily/long-term

## 2014-08-03 LAB — COMPREHENSIVE METABOLIC PANEL
ALT: 68 U/L — AB (ref 0–53)
AST: 33 U/L (ref 0–37)
Albumin: 4.6 g/dL (ref 3.5–5.2)
Alkaline Phosphatase: 72 U/L (ref 39–117)
BUN: 15 mg/dL (ref 6–23)
CO2: 27 mEq/L (ref 19–32)
Calcium: 9.9 mg/dL (ref 8.4–10.5)
Chloride: 99 mEq/L (ref 96–112)
Creat: 1.04 mg/dL (ref 0.50–1.35)
Glucose, Bld: 98 mg/dL (ref 70–99)
Potassium: 4.5 mEq/L (ref 3.5–5.3)
SODIUM: 137 meq/L (ref 135–145)
TOTAL PROTEIN: 7.2 g/dL (ref 6.0–8.3)
Total Bilirubin: 1.1 mg/dL (ref 0.2–1.2)

## 2014-08-03 LAB — HEMOGLOBIN A1C
Hgb A1c MFr Bld: 5.8 % — ABNORMAL HIGH (ref <5.7)
Mean Plasma Glucose: 120 mg/dL — ABNORMAL HIGH (ref <117)

## 2014-08-03 LAB — LIPID PANEL
Cholesterol: 236 mg/dL — ABNORMAL HIGH (ref 0–200)
HDL: 67 mg/dL (ref >39)
LDL CALC: 146 mg/dL — AB (ref 0–99)
Total CHOL/HDL Ratio: 3.5 Ratio
Triglycerides: 115 mg/dL (ref <150)
VLDL: 23 mg/dL (ref 0–40)

## 2014-08-06 ENCOUNTER — Encounter: Payer: Self-pay | Admitting: Family Medicine

## 2014-08-16 ENCOUNTER — Other Ambulatory Visit: Payer: Self-pay | Admitting: Family Medicine

## 2014-08-17 NOTE — Telephone Encounter (Signed)
Is this okay to phone in?

## 2014-08-17 NOTE — Telephone Encounter (Signed)
Ok for #30 and a refill

## 2014-11-07 ENCOUNTER — Other Ambulatory Visit: Payer: Self-pay | Admitting: Family Medicine

## 2014-11-07 NOTE — Telephone Encounter (Signed)
Is this okay to refill? 

## 2014-11-07 NOTE — Telephone Encounter (Signed)
[place] for #30 with 1 add'l refill

## 2014-11-08 ENCOUNTER — Other Ambulatory Visit: Payer: Self-pay | Admitting: *Deleted

## 2014-11-08 MED ORDER — ALPRAZOLAM 0.5 MG PO TABS: ORAL_TABLET | ORAL | Status: DC

## 2014-11-23 ENCOUNTER — Other Ambulatory Visit: Payer: Self-pay | Admitting: Family Medicine

## 2015-01-23 ENCOUNTER — Other Ambulatory Visit: Payer: Self-pay | Admitting: Family Medicine

## 2015-01-24 NOTE — Telephone Encounter (Signed)
Ok for #30 

## 2015-01-24 NOTE — Telephone Encounter (Signed)
Called in med to pharmacy  

## 2015-01-24 NOTE — Telephone Encounter (Signed)
Is this okay to refill? Pt has an appt in April for cpe

## 2015-02-01 ENCOUNTER — Encounter: Payer: BC Managed Care – PPO | Admitting: Family Medicine

## 2015-02-19 ENCOUNTER — Other Ambulatory Visit: Payer: Self-pay | Admitting: Family Medicine

## 2015-03-12 ENCOUNTER — Other Ambulatory Visit: Payer: Self-pay | Admitting: Family Medicine

## 2015-03-12 NOTE — Telephone Encounter (Signed)
Last filled 3/30.  Okay for #30, no refill.

## 2015-03-12 NOTE — Telephone Encounter (Signed)
Is this okay to refill? 

## 2015-03-19 ENCOUNTER — Other Ambulatory Visit: Payer: Self-pay | Admitting: Family Medicine

## 2015-04-18 ENCOUNTER — Encounter: Payer: Self-pay | Admitting: Family Medicine

## 2015-04-18 ENCOUNTER — Ambulatory Visit (INDEPENDENT_AMBULATORY_CARE_PROVIDER_SITE_OTHER): Payer: BLUE CROSS/BLUE SHIELD | Admitting: Family Medicine

## 2015-04-18 VITALS — BP 130/86 | HR 80 | Ht 70.0 in | Wt 180.0 lb

## 2015-04-18 DIAGNOSIS — Z0000 Encounter for general adult medical examination without abnormal findings: Secondary | ICD-10-CM | POA: Diagnosis not present

## 2015-04-18 DIAGNOSIS — K219 Gastro-esophageal reflux disease without esophagitis: Secondary | ICD-10-CM | POA: Diagnosis not present

## 2015-04-18 DIAGNOSIS — E7800 Pure hypercholesterolemia, unspecified: Secondary | ICD-10-CM

## 2015-04-18 DIAGNOSIS — F1010 Alcohol abuse, uncomplicated: Secondary | ICD-10-CM

## 2015-04-18 DIAGNOSIS — I10 Essential (primary) hypertension: Secondary | ICD-10-CM | POA: Diagnosis not present

## 2015-04-18 DIAGNOSIS — F411 Generalized anxiety disorder: Secondary | ICD-10-CM | POA: Diagnosis not present

## 2015-04-18 DIAGNOSIS — R7301 Impaired fasting glucose: Secondary | ICD-10-CM

## 2015-04-18 DIAGNOSIS — L218 Other seborrheic dermatitis: Secondary | ICD-10-CM | POA: Diagnosis not present

## 2015-04-18 DIAGNOSIS — R7989 Other specified abnormal findings of blood chemistry: Secondary | ICD-10-CM

## 2015-04-18 DIAGNOSIS — Z5181 Encounter for therapeutic drug level monitoring: Secondary | ICD-10-CM

## 2015-04-18 DIAGNOSIS — E790 Hyperuricemia without signs of inflammatory arthritis and tophaceous disease: Secondary | ICD-10-CM

## 2015-04-18 DIAGNOSIS — L219 Seborrheic dermatitis, unspecified: Secondary | ICD-10-CM

## 2015-04-18 LAB — COMPREHENSIVE METABOLIC PANEL
ALK PHOS: 77 U/L (ref 39–117)
ALT: 54 U/L — AB (ref 0–53)
AST: 30 U/L (ref 0–37)
Albumin: 4.7 g/dL (ref 3.5–5.2)
BILIRUBIN TOTAL: 1.4 mg/dL — AB (ref 0.2–1.2)
BUN: 16 mg/dL (ref 6–23)
CALCIUM: 9.9 mg/dL (ref 8.4–10.5)
CHLORIDE: 98 meq/L (ref 96–112)
CO2: 29 mEq/L (ref 19–32)
Creat: 1.01 mg/dL (ref 0.50–1.35)
Glucose, Bld: 91 mg/dL (ref 70–99)
Potassium: 4.5 mEq/L (ref 3.5–5.3)
Sodium: 138 mEq/L (ref 135–145)
Total Protein: 7.2 g/dL (ref 6.0–8.3)

## 2015-04-18 LAB — HEMOGLOBIN A1C
Hgb A1c MFr Bld: 5.9 % — ABNORMAL HIGH (ref <5.7)
MEAN PLASMA GLUCOSE: 123 mg/dL — AB (ref <117)

## 2015-04-18 LAB — POCT URINALYSIS DIPSTICK
BILIRUBIN UA: NEGATIVE
GLUCOSE UA: NEGATIVE
Ketones, UA: NEGATIVE
Leukocytes, UA: NEGATIVE
Nitrite, UA: NEGATIVE
Protein, UA: NEGATIVE
RBC UA: NEGATIVE
Spec Grav, UA: 1.01
Urobilinogen, UA: NEGATIVE
pH, UA: 7.5

## 2015-04-18 LAB — CBC WITH DIFFERENTIAL/PLATELET
BASOS ABS: 0.1 10*3/uL (ref 0.0–0.1)
BASOS PCT: 1 % (ref 0–1)
EOS PCT: 0 % (ref 0–5)
Eosinophils Absolute: 0 10*3/uL (ref 0.0–0.7)
HEMATOCRIT: 46.6 % (ref 39.0–52.0)
HEMOGLOBIN: 16.2 g/dL (ref 13.0–17.0)
Lymphocytes Relative: 23 % (ref 12–46)
Lymphs Abs: 2 10*3/uL (ref 0.7–4.0)
MCH: 31.2 pg (ref 26.0–34.0)
MCHC: 34.8 g/dL (ref 30.0–36.0)
MCV: 89.6 fL (ref 78.0–100.0)
MPV: 10.5 fL (ref 8.6–12.4)
Monocytes Absolute: 0.8 10*3/uL (ref 0.1–1.0)
Monocytes Relative: 9 % (ref 3–12)
Neutro Abs: 6 10*3/uL (ref 1.7–7.7)
Neutrophils Relative %: 67 % (ref 43–77)
Platelets: 300 10*3/uL (ref 150–400)
RBC: 5.2 MIL/uL (ref 4.22–5.81)
RDW: 12.9 % (ref 11.5–15.5)
WBC: 8.9 10*3/uL (ref 4.0–10.5)

## 2015-04-18 LAB — LIPID PANEL
Cholesterol: 229 mg/dL — ABNORMAL HIGH (ref 0–200)
HDL: 62 mg/dL (ref >=40)
LDL Cholesterol: 138 mg/dL — ABNORMAL HIGH (ref 0–99)
TRIGLYCERIDES: 145 mg/dL (ref <150)
Total CHOL/HDL Ratio: 3.7 Ratio
VLDL: 29 mg/dL (ref 0–40)

## 2015-04-18 LAB — TSH: TSH: 1.727 u[IU]/mL (ref 0.350–4.500)

## 2015-04-18 LAB — URIC ACID: URIC ACID, SERUM: 7.8 mg/dL (ref 4.0–7.8)

## 2015-04-18 MED ORDER — LISINOPRIL-HYDROCHLOROTHIAZIDE 10-12.5 MG PO TABS: ORAL_TABLET | ORAL | Status: DC

## 2015-04-18 MED ORDER — ATORVASTATIN CALCIUM 20 MG PO TABS: ORAL_TABLET | ORAL | Status: DC

## 2015-04-18 NOTE — Progress Notes (Signed)
Chief Complaint  Patient presents with  . Annual Exam    fasting annual exam. Did not do eye exam as he just had one. Mentions that he had shingles vaccine 10/15 and two weeks after he developed a rash in three different places, both thighs and his back-was itchy and used cortisone cream and this help. Also has a dry, itchy place on his head that comes and goes.    Jeremy Bush is a 59 y.o. male who presents for a complete physical.  He has the following concerns:  Itchy bumps at his left thigh, and in the center of his back that started after his shingles vaccine.  Doesn't describe them as blisters, not painful.  Described as itchy bumps. Has slight scarring on his thigh.  Cortisone topically helped, but it would come and go for a while.  He also had small area also on his right thigh. None are currently active/inflamed/itchy.  Dry patch on the top of his scalp, flaky.  It will go away and come back, depending on the shampoo that he uses.  Not itchy. Similar flaking intermittently at his ears.  Hypertension follow-up: BP's at home in the evening are running consistently 110-120/70-80. In the mornings it is higher, usually 130/80 prior to his visit this morning at home. Mornings usually run 130/80, only rarely up to 140/90. Morning BP check is when he first wakes up, the same time he takes his medication. He denies any headaches, chest pain, palpitations, or side effects of meds.   Hyperlipidemia follow-up: Patient is reportedly following a low-fat, low cholesterol diet. Compliant with medications and denies medication side effects.  He has been eating a lot of fish, doesn't eat fried foods.  Red meat about once a week.  GERD: He backed down on PPI to just as needed, and sometimes has gone 4-5 days without any recurrent symptoms. Seems to be worse when he eats late dinners. He has elevated the head of his bed. Denies dysphagia.  IFG: Last A1c was 5.8 in October 2015.  Possible gout  episode over 6 months ago. Uric acid level was 7.8. He had one other time in the last year where he noted some redness and pain in one of his toes.  It went away quickly. He thinks it was triggered by shellfish (clams).  Anxiety--takes 1/2 tablet daily on the days he works.  He never uses it at night, and is weaning himself off of using Unisom, teaching himself how to sleep.  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 He is on his yacht Thursday through Monday (in [place]). Plans to retire soon.   Alcohol intake has increased--related to having guests, socializing. He knows he needs to cut back.  He reports his wife has been trying to help--encouraging him to go for bike rides in the evenings rather than relaxing and drinking.  Prostate cancer--under care of Dr. Karsten Ro. S/p radioactive seed implant.e Last seen within 2 months. He sees him every 6 months, scheduled for 3 weeks from now. He was told PSA raised just a little, but it was still <1. Denies ED or other side effects. Gets up once to void at 4am.   Immunization History  Administered Date(s) Administered  . Influenza Split 07/22/2011, 07/29/2012  . Influenza,inj,Quad PF,36+ Mos 07/28/2013, 08/02/2014  . Td 09/15/2005  . Tdap 01/13/2013  . Zoster 08/02/2014   Last colonoscopy: 04/2012 Dr. Michail Sermon (polyps), q5 years  Last PSA: UTD per urologist  Dentist: 3 times yearly  Ophtho: yearly, went 3 months ago. Exercise: Some bike riding on the weekends (for 3 hrs); 2 mile walks with the dog, sometimes longer. Admits that the walking isn't rigorous.  Past Medical History  Diagnosis Date  . Hyperlipidemia   . Colonic polyp 2007, 2013  . Prostate cancer 03/24/12    gleason 6, vol 31.3 cc  . Hypertension   . Nocturia   . Anxiety     related to work stress  . Elevated uric acid in blood     with possible gout flare x 2    Past Surgical History  Procedure Laterality Date  . Biopsy prostate   03/24/12    gleason 3+3=6/prostate volume 31 cc's  (md office)  . Radioactive seed implant  08/13/2012    Procedure: RADIOACTIVE SEED IMPLANT;  Surgeon: Claybon Jabs, MD;  Location: [facility];  Service: Urology;  Laterality: N/A;   79    seeds implanted  . Cystoscopy  08/13/2012    Procedure: CYSTOSCOPY FLEXIBLE;  Surgeon: Claybon Jabs, MD;  Location: [facility];  Service: Urology;  Laterality: N/A;  no seeds found in bladder    History   Social History  . Marital Status: Married    Spouse Name: N/A  . Number of Children: N/A  . Years of Education: N/A   Occupational History  . Not on file.   Social History Main Topics  . Smoking status: Never Smoker   . Smokeless tobacco: Never Used  . Alcohol Use: 8.4 oz/week    14 Glasses of wine per week     Comment: 3-4 drinks daily, after 5pm.  . Drug Use: No  . Sexual Activity:    Partners: Female   Other Topics Concern  . Not on file   Social History Narrative   Lives with wife, 1 dog.  2 children and 1 stepchild.  Daughters in [place], stepson in [place]. 2 granddaughter    Family History  Problem Relation Age of Onset  . Cancer Mother     lung (smoker)  . Hypertension Mother   . Hyperlipidemia Mother   . Stroke Father     mini-stroke  . Hypertension Father   . Kidney disease Father     on dialysis  . Diabetes Father   . Healthy Sister     Outpatient Encounter Prescriptions as of 04/18/2015  Medication Sig Note  . ALPRAZolam (XANAX) 0.5 MG tablet TAKE 1/2 (ONE-HALF) TO 1 TABLET BY MOUTH THREE TIMES DAILY AS NEEDED SLEEP OR ANXIETY 04/18/2015: Takes 1/2 tablet once daily in the mornings that he is in the office (about 3-4x/week)  . atorvastatin (LIPITOR) 20 MG tablet TAKE 1 TABLET (20 MG TOTAL) BY MOUTH DAILY.   Marland Kitchen lisinopril-hydrochlorothiazide (PRINZIDE,ZESTORETIC) 10-12.5 MG per tablet TAKE 1 TABLET BY MOUTH DAILY.   Marland Kitchen omeprazole (PRILOSEC) 40 MG capsule TAKE 1 CAPSULE (40 MG TOTAL) BY MOUTH  DAILY. 04/18/2015: Uses prn  . [DISCONTINUED] atorvastatin (LIPITOR) 20 MG tablet TAKE 1 TABLET (20 MG TOTAL) BY MOUTH DAILY.   . [DISCONTINUED] lisinopril-hydrochlorothiazide (PRINZIDE,ZESTORETIC) 10-12.5 MG per tablet TAKE 1 TABLET BY MOUTH DAILY.   . [DISCONTINUED] lisinopril-hydrochlorothiazide (PRINZIDE,ZESTORETIC) 10-12.5 MG per tablet TAKE 1 TABLET BY MOUTH DAILY.    No facility-administered encounter medications on file as of 04/18/2015.    No Known Allergies  ROS: The patient denies anorexia, fever, weight changes, headaches, vision loss, decreased hearing, ear pain, hoarseness, chest pain, palpitations, dizziness, syncope, dyspnea  on exertion, cough, swelling, nausea, vomiting, diarrhea, constipation, abdominal pain, melena, hematochezia, hematuria, incontinence, erectile dysfunction, nocturia, weakened urine stream, dysuria, genital lesions, joint pains, numbness, tingling, weakness, tremor, suspicious skin lesions, depression, anxiety, abnormal bleeding/bruising, or enlarged lymph nodes.  Rashes as per HPI. Reflux at night is less often, per HPI   PHYSICAL EXAM:  BP 130/86 mmHg  Pulse 80  Ht 5\' 10"  (1.778 m)  Wt 180 lb (81.647 kg)  BMI 25.83 kg/m2  General Appearance:  Alert, cooperative, no distress, appears stated age   Head:  Normocephalic, without obvious abnormality, atraumatic   Eyes:  PERRL, conjunctiva/corneas clear, EOM's intact, fundi  benign   Ears:  Normal TM's and external ear canals   Nose:  Nares normal, mucosa normal, no drainage or sinus tenderness   Throat:  Lips, mucosa, and tongue normal; teeth and gums normal   Neck:  Supple, no lymphadenopathy; thyroid: no enlargement/tenderness/nodules; no carotid  bruit or JVD   Back:  Spine nontender, no curvature, ROM normal, no CVA tenderness   Lungs:  Clear to auscultation bilaterally without wheezes, rales or ronchi; respirations unlabored   Chest Wall:  No tenderness or deformity    Heart:  Regular rate and rhythm, S1 and S2 normal, no murmur, rub  or gallop   Breast Exam:  No chest wall tenderness, masses or gynecomastia   Abdomen:  Soft, non-tender, nondistended, normoactive bowel sounds,  no masses, no hepatosplenomegaly   Genitalia:  Deferred to urologist   Rectal:  Deferred to urologist   Extremities:  No clubbing, cyanosis or edema   Pulses:  2+ and symmetric all extremities   Skin:  Skin color, texture, turgor normal. Ears are smooth. No suspicious lesions. Skin is very tanned.  Some residual hyperpigmentation on both anterior thighs from area of rash. Small focal area of flakiness on right scalp at crown. Remainder of scalp is normal  Lymph nodes:  Cervical, supraclavicular, and axillary nodes normal   Neurologic:  CNII-XII intact, normal strength, sensation and gait; reflexes 2+ and symmetric throughout   Psych: Normal mood, affect, hygiene and grooming.        ASSESSMENT/PLAN:  Annual physical exam - Plan: POCT Urinalysis Dipstick, Lipid panel, Comprehensive metabolic panel, CBC with Differential/Platelet, TSH, Uric Acid, Hemoglobin A1c  Pure hypercholesterolemia - he is currently following a lower cholesterol diet than on last check - Plan: atorvastatin (LIPITOR) 20 MG tablet, Lipid panel  Essential hypertension, benign - well controlled - Plan: lisinopril-hydrochlorothiazide (PRINZIDE,ZESTORETIC) 10-12.5 MG per tablet, Comprehensive metabolic panel  Medication monitoring encounter - Plan: Lipid panel, Comprehensive metabolic panel, CBC with Differential/Platelet, Uric Acid  Elevated uric acid in blood - no recent gout flares. HCTZ and alcohol could contribute.  - Plan: Uric Acid  Impaired fasting glucose - reviewed proper diet, daily exercise - Plan: Comprehensive metabolic panel, Hemoglobin A1c  Anxiety state - related to work stress. as he cuts back work/sells company, cut back on xanax use (except for with  travel)  Seborrheic dermatitis of scalp - discussed OTC dandruff shampoos, vs tea tree shampoo, vs OTC hydrocortisone prn  Gastroesophageal reflux disease without esophagitis - continue prn PPI; improved  Excessive drinking alcohol - Reviewed recommendations, risks; encouraged to cut back to maximum of 2/day   HTN--white coat component with accurate machine GERD--doing well with prn use. Cutting back alcohol recommended.   Recommended at least 30 minutes of aerobic activity at least 5 days/week, weight-bearing exercise at least 2x/week; proper sunscreen use reviewed; healthy diet and alcohol  recommendations (less than or equal to 2 drinks/day) reviewed; regular seatbelt use; changing batteries in smoke detectors. Self-testicular exams. Immunization recommendations discussed--yearly flu shots.  Colonoscopy recommendations reviewed--UTD, due again 2018  Rash on thighs--resolved. Suspect contact derm by history, ? From dog.    F/u 6 months for med check, sooner prn.

## 2015-04-18 NOTE — Patient Instructions (Addendum)
HEALTH MAINTENANCE RECOMMENDATIONS:  It is recommended that you get at least 30 minutes of aerobic exercise at least 5 days/week (for weight loss, you may need as much as 60-90 minutes). This can be any activity that gets your heart rate up. This can be divided in 10-15 minute intervals if needed, but try and build up your endurance at least once a week.  Weight bearing exercise is also recommended twice weekly.  Eat a healthy diet with lots of vegetables, fruits and fiber.  "Colorful" foods have a lot of vitamins (ie green vegetables, tomatoes, red peppers, etc).  Limit sweet tea, regular sodas and alcoholic beverages, all of which has a lot of calories and sugar.  Up to 2 alcoholic drinks daily may be beneficial for men (unless trying to lose weight, watch sugars).  Drink a lot of water.  Sunscreen of at least SPF 30 should be used on all sun-exposed parts of the skin when outside between the hours of 10 am and 4 pm (not just when at beach or pool, but even with exercise, golf, tennis, and yard work!)  Use a sunscreen that says "broad spectrum" so it covers both UVA and UVB rays, and make sure to reapply every 1-2 hours.  Remember to change the batteries in your smoke detectors when changing your clock times in the spring and fall.  Use your seat belt every time you are in a car, and please drive safely and not be distracted with cell phones and texting while driving.  You can use either dandruff shampoo vs hydrocortisone cream to the small patches on your scalp or ears that are flaky.  Seborrheic Dermatitis Seborrheic dermatitis involves pink or red skin with greasy, flaky scales. This is often found on the scalp, eyebrows, nose, bearded area, and on or behind the ears. It can also occur on the central chest. It often occurs where there are more oil (sebaceous) glands. This condition is also known as dandruff. When this condition affects a baby's scalp, it is called cradle cap. It may come and go  for no known reason. It can occur at any time of life from infancy to old age. CAUSES  The cause is unknown. It is not the result of too little moisture or too much oil. In some people, seborrheic dermatitis flare-ups seem to be triggered by stress. It also commonly occurs in people with certain diseases such as Parkinson's disease or HIV/AIDS. SYMPTOMS   Thick scales on the scalp.  Redness on the face or in the armpits.  The skin may seem oily or dry, but moisturizers do not help.  In infants, seborrheic dermatitis appears as scaly redness that does not seem to bother the baby. In some babies, it affects only the scalp. In others, it also affects the neck creases, armpits, groin, or behind the ears.  In adults and adolescents, seborrheic dermatitis may affect only the scalp. It may look patchy or spread out, with areas of redness and flaking. Other areas commonly affected include:  Eyebrows.  Eyelids.  Forehead.  Skin behind the ears.  Outer ears.  Chest.  Armpits.  Nose creases.  Skin creases under the breasts.  Skin between the buttocks.  Groin.  Some adults and adolescents feel itching or burning in the affected areas. DIAGNOSIS  Your caregiver can usually tell what the problem is by doing a physical exam. TREATMENT   Cortisone (steroid) ointments, creams, and lotions can help decrease inflammation.  Babies can be treated with  baby oil to soften the scales, then they may be washed with baby shampoo. If this does not help, a prescription topical steroid medicine may work.  Adults can use medicated shampoos.  Your caregiver may prescribe corticosteroid cream and shampoo containing an antifungal or yeast medicine (ketoconazole). Hydrocortisone or anti-yeast cream can be rubbed directly onto seborrheic dermatitis patches. Yeast does not cause seborrheic dermatitis, but it seems to add to the problem. In infants, seborrheic dermatitis is often worst during the first  year of life. It tends to disappear on its own as the child grows. However, it may return during the teenage years. In adults and adolescents, seborrheic dermatitis tends to be a long-lasting condition that comes and goes over many years. HOME CARE INSTRUCTIONS   Use prescribed medicines as directed.  In infants, do not aggressively remove the scales or flakes on the scalp with a comb or by other means. This may lead to hair loss. SEEK MEDICAL CARE IF:   The problem does not improve from the medicated shampoos, lotions, or other medicines given by your caregiver.  You have any other questions or concerns. Document Released: 10/13/2005 Document Revised: 04/13/2012 Document Reviewed: 03/04/2010 [facility] Patient Information 2015 [place], [place]. This information is not intended to replace advice given to you by your health care provider. Make sure you discuss any questions you have with your health care provider.

## 2015-04-19 ENCOUNTER — Encounter: Payer: Self-pay | Admitting: Family Medicine

## 2015-04-24 ENCOUNTER — Other Ambulatory Visit: Payer: Self-pay | Admitting: Family Medicine

## 2015-04-25 NOTE — Telephone Encounter (Signed)
Is this okay to call in? 

## 2015-04-25 NOTE — Telephone Encounter (Signed)
[facility] for #30 with 1 refill

## 2015-05-23 ENCOUNTER — Other Ambulatory Visit: Payer: Self-pay | Admitting: Family Medicine

## 2015-08-09 ENCOUNTER — Other Ambulatory Visit: Payer: Self-pay | Admitting: Family Medicine

## 2015-08-09 ENCOUNTER — Telehealth: Payer: Self-pay

## 2015-08-09 NOTE — Telephone Encounter (Signed)
Is this okay to call in? Last refilled 04/25/15 for #30 with 1 refill.

## 2015-08-09 NOTE — Telephone Encounter (Signed)
Refill request for Alprazolam 0.5mg  #30

## 2015-08-09 NOTE — Telephone Encounter (Signed)
Ok for #30 with 1 refill 

## 2015-10-16 ENCOUNTER — Other Ambulatory Visit: Payer: Self-pay | Admitting: Family Medicine

## 2015-11-01 ENCOUNTER — Encounter: Payer: Self-pay | Admitting: Family Medicine

## 2015-11-01 ENCOUNTER — Ambulatory Visit (INDEPENDENT_AMBULATORY_CARE_PROVIDER_SITE_OTHER): Payer: BLUE CROSS/BLUE SHIELD | Admitting: Family Medicine

## 2015-11-01 VITALS — BP 140/96 | HR 100 | Ht 70.0 in | Wt 187.6 lb

## 2015-11-01 DIAGNOSIS — Z23 Encounter for immunization: Secondary | ICD-10-CM | POA: Diagnosis not present

## 2015-11-01 DIAGNOSIS — K219 Gastro-esophageal reflux disease without esophagitis: Secondary | ICD-10-CM

## 2015-11-01 DIAGNOSIS — Z1159 Encounter for screening for other viral diseases: Secondary | ICD-10-CM

## 2015-11-01 DIAGNOSIS — R7301 Impaired fasting glucose: Secondary | ICD-10-CM

## 2015-11-01 DIAGNOSIS — I10 Essential (primary) hypertension: Secondary | ICD-10-CM | POA: Diagnosis not present

## 2015-11-01 DIAGNOSIS — E7800 Pure hypercholesterolemia, unspecified: Secondary | ICD-10-CM

## 2015-11-01 DIAGNOSIS — M109 Gout, unspecified: Secondary | ICD-10-CM

## 2015-11-01 DIAGNOSIS — Z5181 Encounter for therapeutic drug level monitoring: Secondary | ICD-10-CM | POA: Diagnosis not present

## 2015-11-01 MED ORDER — ALLOPURINOL 100 MG PO TABS: 100.0000 mg | ORAL_TABLET | Freq: Every day | ORAL | Status: DC

## 2015-11-01 MED ORDER — LISINOPRIL 20 MG PO TABS: 20.0000 mg | ORAL_TABLET | Freq: Every day | ORAL | Status: DC

## 2015-11-01 NOTE — Patient Instructions (Signed)
Stop taking the lisinopril HCTZ that you have at home. Instead, take the 20mg  lisinopril once daily. Start taking allopurinol once daily.  Continue to monitor your blood pressures. Make a list with 3 columns--morning and evening, and columns; bring the list to your visit.

## 2015-11-01 NOTE — Progress Notes (Signed)
Chief Complaint  Patient presents with  . Hypertension    fasting med check. Did have gout attack about 3 months ago-he states that he was out of town. If it happens again when he is around he will come in.    Hypertension follow-up: BP's at home in the evening are running consistently 113-123/60's. In the mornings it is higher, usually 140/80. This morning it was 150/100, but after 45 mins on treadmill, it was 140/90. Morning BP check is when he first wakes up, the same time he takes his medication. He denies any headaches, chest pain, palpitations, or side effects of meds. He does feel stressed about work starting when he wakes up in the morning, but feels more relaxed in the evenings.  Hyperlipidemia follow-up: Patient is reportedly following a low-fat, low cholesterol diet. Compliant with medications and denies medication side effects. [place] has [place], mostly chicken and fish when home, doesn't eat fried foods. Red meat about once a week or less.  Eggs on the weekends (2/weekend). Last check was 6 months ago: Lab Results  Component Value Date   CHOL 229* 04/18/2015   HDL 62 04/18/2015   LDLCALC 138* 04/18/2015   TRIG 145 04/18/2015   CHOLHDL 3.7 04/18/2015    GERD: He backed down on PPI to just as needed, and sometimes has gone 3-5 days without any recurrent symptoms. Seems to be worse when he eats late dinners. He has elevated the head of his bed. Denies dysphagia.  IFG: Last A1c was 5.9 in 03/2015.  Possible gout--he had another episode in November-he woke up with pain and swelling in the right 2nd toe.  He took Advil, which helped. Symptoms lasted for 5-6 days, but bad only for 3 days.  Has had 2 prior episodes as well.  Uric acid level was elevated at 7.8 six months ago. He recalls one being triggered by shellfish (clams).  Anxiety--takes 1/2 tablet daily on the days he works. He never uses it at night; he weaned himself off of using Unisom (only  very rarely uses). 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 He just received an indication of interest to buy his company from one of them (looks good). He is on his yacht Thursday through Monday (in [place]), though not during the winter. Plans to retire soon.   Exercise--rides bikes when on [employer].  Since 1/1 he has been on the elliptical 45 minutes before work and weights for 10-15 minutes.  Admits to not getting regular exercise while in [place] otherwise. He recently was getting 15K steps (100K/week) over a 2 month step challenge. He was walking to work.   Prostate cancer--under care of Dr. Karsten Ro. S/p radioactive seed implant. He sees him every 6 months. Denies ED or other side effects. Gets up once to void at 4am. Last PSA was 0.46 in July.  PMH, PSH, SH, FH were updated and reviewed.  Outpatient Encounter Prescriptions as of 11/01/2015  Medication Sig  . ALPRAZolam (XANAX) 0.5 MG tablet TAKE 1/2 TO 1 TABLET BY MOUTH 3 TIMES DAILY AS NEEDED FOR SLEEP OR ANXIETY  . atorvastatin (LIPITOR) 20 MG tablet TAKE 1 TABLET (20 MG TOTAL) BY MOUTH DAILY.  Marland Kitchen omeprazole (PRILOSEC) 40 MG capsule TAKE 1 CAPSULE (40 MG TOTAL) BY MOUTH DAILY.  . [DISCONTINUED] lisinopril-hydrochlorothiazide (PRINZIDE,ZESTORETIC) 10-12.5 MG tablet TAKE 1 TABLET BY MOUTH DAILY.  Marland Kitchen allopurinol (ZYLOPRIM) 100 MG tablet Take 1 tablet (  100 mg total) by mouth daily.  Marland Kitchen lisinopril (PRINIVIL,ZESTRIL) 20 MG tablet Take 1 tablet (20 mg total) by mouth daily.   No facility-administered encounter medications on file as of 11/01/2015.   No Known Allergies  ROS: no fever, chills, URI symptoms, headaches, dizziness, chest pain, palpitations, nausea, vomiting, abdominal pain, bowel changes, urinary complaints, bleeding, bruising, rash, depression, anxiety, chest pain, shortness of breath. Toe pain/swelling in November, no other joint pains. See HPI.  PHYSICAL EXAM: BP 150/100 mmHg  Pulse 100  Ht 5'  10" (1.778 m)  Wt 187 lb 9.6 oz (85.095 kg)  BMI 26.92 kg/m2 Pt's monitor 154/98 after nurse checked.  140/96 on repeat by MD  Well developed, pleasant male in no distress  HEENT: PERRL, EOMI, conjunctiva and sclera are clear. OP is clear. Neck: no lymphadenopathy, thyromegaly or mass, no carotid bruit. Heart: regular rate and rhythm without murmur  Lungs: clear bilaterally  Abdomen: soft, nontender, no mass, no organomegaly Extremities: no edema, 2+ pulses Skin: no rash  Psych: normal mood, affect, hygiene and grooming Neuro: alert and oriented. Normal strength, sensation, gait; cranial nerves grossly intact  ASSESSMENT/PLAN:  Pure hypercholesterolemia - Plan: Lipid panel, Hepatic function panel  Need for prophylactic vaccination and inoculation against influenza - Plan: Flu Vaccine QUAD 36+ mos PF IM (Fluarix & Fluzone Quad PF)  Essential hypertension, benign - normal in evenings, elevated in morning, stress-related. Change to lisinopril to stop HCTZ due to gout - Plan: lisinopril (PRINIVIL,ZESTRIL) 20 MG tablet  IFG (impaired fasting glucose) - reviewed proper diet, exercise - Plan: Glucose, random, Hemoglobin A1c  Gastroesophageal reflux disease without esophagitis - well controlled with diet and prn PPI  Gout of right foot, unspecified cause, unspecified chronicity - stop HCTZ, start low dose allopurinol, low purine diet. recheck uric acid 2 mos - Plan: Uric acid, allopurinol (ZYLOPRIM) 100 MG tablet   HTN--good BP's in the evenings, higher in the mornings--related to stress about work, and also is when BP meds are wearing off. Given that he has continued to have some gout flares, we will change his lisinopril HCT from 10-12.5 to plain lisinopril '20mg'$ . Also will start low dose allopurinol at '100mg'$  daily. Return in 2 months for uric acid and b-met  6 months CPE with fasting labs prior   c-met, lipid, A1c, uric acid, TSH, Hep C Ab

## 2015-11-02 LAB — LIPID PANEL
CHOLESTEROL: 256 mg/dL — AB (ref 125–200)
HDL: 69 mg/dL (ref >=40)
LDL Cholesterol: 164 mg/dL — ABNORMAL HIGH (ref <130)
Total CHOL/HDL Ratio: 3.7 Ratio (ref <=5.0)
Triglycerides: 115 mg/dL (ref <150)
VLDL: 23 mg/dL (ref <30)

## 2015-11-02 LAB — HEPATIC FUNCTION PANEL
ALBUMIN: 4.9 g/dL (ref 3.6–5.1)
ALK PHOS: 72 U/L (ref 40–115)
ALT: 86 U/L — AB (ref 9–46)
AST: 45 U/L — AB (ref 10–35)
BILIRUBIN TOTAL: 1.3 mg/dL — AB (ref 0.2–1.2)
Bilirubin, Direct: 0.2 mg/dL (ref <=0.2)
Indirect Bilirubin: 1.1 mg/dL (ref 0.2–1.2)
Total Protein: 7.6 g/dL (ref 6.1–8.1)

## 2015-11-02 LAB — HEMOGLOBIN A1C
Hgb A1c MFr Bld: 5.6 % (ref <5.7)
Mean Plasma Glucose: 114 mg/dL (ref <117)

## 2015-11-02 LAB — URIC ACID: URIC ACID, SERUM: 8.6 mg/dL — AB (ref 4.0–7.8)

## 2015-11-02 LAB — GLUCOSE, RANDOM: GLUCOSE: 96 mg/dL (ref 65–99)

## 2015-11-15 ENCOUNTER — Other Ambulatory Visit: Payer: Self-pay | Admitting: Family Medicine

## 2015-11-15 NOTE — Telephone Encounter (Signed)
Ok for #30 

## 2015-11-15 NOTE — Telephone Encounter (Signed)
Is this okay to refill? 

## 2015-12-24 ENCOUNTER — Other Ambulatory Visit: Payer: Self-pay | Admitting: Family Medicine

## 2015-12-24 NOTE — Telephone Encounter (Signed)
Dr Redmond School ok to refill

## 2015-12-24 NOTE — Telephone Encounter (Signed)
This one is yours

## 2015-12-24 NOTE — Telephone Encounter (Signed)
Last filled 1/19.  Okay to refill #30, no refill

## 2015-12-24 NOTE — Telephone Encounter (Signed)
Is this okay refill? 

## 2015-12-28 ENCOUNTER — Other Ambulatory Visit: Payer: Self-pay | Admitting: Family Medicine

## 2016-01-03 ENCOUNTER — Encounter: Payer: Self-pay | Admitting: Family Medicine

## 2016-01-03 ENCOUNTER — Ambulatory Visit (INDEPENDENT_AMBULATORY_CARE_PROVIDER_SITE_OTHER): Payer: BLUE CROSS/BLUE SHIELD | Admitting: Family Medicine

## 2016-01-03 VITALS — BP 164/92 | HR 92 | Ht 70.0 in | Wt 186.0 lb

## 2016-01-03 DIAGNOSIS — M109 Gout, unspecified: Secondary | ICD-10-CM

## 2016-01-03 DIAGNOSIS — Z5181 Encounter for therapeutic drug level monitoring: Secondary | ICD-10-CM | POA: Diagnosis not present

## 2016-01-03 DIAGNOSIS — R945 Abnormal results of liver function studies: Secondary | ICD-10-CM

## 2016-01-03 DIAGNOSIS — R7989 Other specified abnormal findings of blood chemistry: Secondary | ICD-10-CM

## 2016-01-03 DIAGNOSIS — E7800 Pure hypercholesterolemia, unspecified: Secondary | ICD-10-CM

## 2016-01-03 DIAGNOSIS — I10 Essential (primary) hypertension: Secondary | ICD-10-CM

## 2016-01-03 LAB — COMPREHENSIVE METABOLIC PANEL
ALT: 45 U/L (ref 9–46)
AST: 25 U/L (ref 10–35)
Albumin: 4.6 g/dL (ref 3.6–5.1)
Alkaline Phosphatase: 69 U/L (ref 40–115)
BUN: 13 mg/dL (ref 7–25)
CHLORIDE: 102 mmol/L (ref 98–110)
CO2: 26 mmol/L (ref 20–31)
CREATININE: 0.96 mg/dL (ref 0.70–1.25)
Calcium: 9.7 mg/dL (ref 8.6–10.3)
Glucose, Bld: 99 mg/dL (ref 65–99)
Potassium: 4.8 mmol/L (ref 3.5–5.3)
SODIUM: 137 mmol/L (ref 135–146)
Total Bilirubin: 0.9 mg/dL (ref 0.2–1.2)
Total Protein: 7.3 g/dL (ref 6.1–8.1)

## 2016-01-03 LAB — URIC ACID: Uric Acid, Serum: 6.2 mg/dL (ref 4.0–7.8)

## 2016-01-03 MED ORDER — ATORVASTATIN CALCIUM 20 MG PO TABS: ORAL_TABLET | ORAL | Status: DC

## 2016-01-03 NOTE — Progress Notes (Signed)
Chief Complaint  Patient presents with  . Hypertension    fasting med check.    Patient was last seen 2 months ago, at which time he was continuing to have gout flares, so med changes were made.  We started him on allopurinol '100mg'$ , and changed lisinopril HCT 10-12.5 to lisinopril '20mg'$  daily. He is due for labs today.  Tolerating allopurinol without side effects, and no further gout flares. He does recall having  some swelling/tenderness 1-2 weeks after last visit at right knee and left ankle.  No problems with joint pains/swelling since then.  BP's at home remain good in the evenings, high in the mornings, as we discussed at last visit. BP in the evenings have been 120's/70's. Morning BP's have remained elevated.  At home this morning it was 158/93, usually running 150/90.  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 Stress should be much less after this time.  He had a heated call with a salesperson this morning, just prior to today's appointment.  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  Will travel some more for work. This is not binding, so he can leave if he finds himself unhappy. He hopes to be able to take more time for himself, exercise, travel, and will make this work with his new work role. Eventually plans to sell large yachts.  Alcohol intake remains the same (2 drinks after work). LFT's were elevated on last check.  PMH, PSH, SH reviewed  Outpatient Encounter Prescriptions as of 01/03/2016  Medication Sig  . allopurinol (ZYLOPRIM) 100 MG tablet Take 1 tablet (100 mg total) by mouth daily.  Marland Kitchen ALPRAZolam (XANAX) 0.5 MG tablet TAKE 1/2 TO 1 TABLET BY MOUTH 3 TIMES DAILY AS NEEDED FOR SLEEP OR ANXIETY  . atorvastatin (LIPITOR) 20 MG tablet TAKE 1 TABLET (20 MG TOTAL) BY MOUTH DAILY.  Marland Kitchen lisinopril (PRINIVIL,ZESTRIL) 20 MG tablet Take 1 tablet (20  mg total) by mouth daily.  Marland Kitchen omeprazole (PRILOSEC) 40 MG capsule TAKE 1 CAPSULE (40 MG TOTAL) BY MOUTH DAILY.  . [DISCONTINUED] atorvastatin (LIPITOR) 20 MG tablet TAKE 1 TABLET (20 MG TOTAL) BY MOUTH DAILY.   No facility-administered encounter medications on file as of 01/03/2016.   No Known Allergies  ROS: no fever, chills, URI symptoms, headaches, dizziness, chest pain, palpitations, edema, nausea, vomiting, bowel change, urinary complaints, bleeding, bruising, rash or other complaints.  Joint pains as above, resolved.  PHYSICAL EXAM: BP 176/104 mmHg  Pulse 92  Ht '5\' 10"'$  (1.778 m)  Wt 186 lb (84.369 kg)  BMI 26.69 kg/m2 164/92 on repeat by MD Well appearing, pleasant male in no distress. Somewhat agitated in thinking about phone call prior to visit, but calmed down some in talking about future, his dog. Repeat BP slightly lower than initial one. Neck: no lymphadenopathy, thyromegaly, mass or bruit Heart: regular rate and rhythm Lungs: clear bilaterally Abdomen: soft, nontender, no mass Extremities: no edema, normal pulses Psych: normal mood, affect, hygiene and grooming Neuro: alert and oriented, cranial nerves intact, normal strength, gait  ASSESSMENT/PLAN:  Essential hypertension, benign - high today, and typically in mornings; stress contributes; normal in p.m.'s. If BP's remain high after company sold, may need to increase lisinopril dose - Plan: Comprehensive metabolic panel  Gout of right foot, unspecified cause, unspecified chronicity - mild flare after starting low dose allopurinol without  further problems. check uric acid today - Plan: Uric acid  Medication monitoring encounter - Plan: Comprehensive metabolic panel, Uric acid  Elevated LFTs - encouraged to cut back on alcohol - Plan: Comprehensive metabolic panel  Pure hypercholesterolemia - needs refill atorvastatin; continue lowfat low cholesterol diet and recheck lipids in July - Plan: atorvastatin (LIPITOR) 20 MG  tablet   Uric acid and c-met today  F/u in July with fasting labs prior

## 2016-01-03 NOTE — Patient Instructions (Signed)
Continue your current medications and monitoring your blood pressure. We will refill the allopurinol after labs reviewed (to see if dose needs to be increased or not).  Let us know what your morning blood pressures are running after you close this deal.  If they are consistently >140/90 then I would bump up the lisinopril dose slightly.  If blood pressure improves, then we can refill the current 20mg  dose and follow up as scheduled in July.

## 2016-01-04 MED ORDER — ALLOPURINOL 100 MG PO TABS: 100.0000 mg | ORAL_TABLET | Freq: Every day | ORAL | Status: DC

## 2016-01-04 NOTE — Addendum Note (Signed)
Addended by: Rita Ohara on: 01/04/2016 07:21 AM   Modules accepted: Orders

## 2016-01-24 ENCOUNTER — Other Ambulatory Visit: Payer: Self-pay | Admitting: Family Medicine

## 2016-01-24 NOTE — Telephone Encounter (Signed)
Is this okay to refill? 

## 2016-01-24 NOTE — Telephone Encounter (Signed)
[facility] for #30, no refill.  Stress shouldn't be for too much longer.Marland KitchenMarland Kitchen

## 2016-03-06 ENCOUNTER — Other Ambulatory Visit: Payer: Self-pay | Admitting: *Deleted

## 2016-03-06 ENCOUNTER — Other Ambulatory Visit: Payer: Self-pay | Admitting: Family Medicine

## 2016-03-06 MED ORDER — ALPRAZOLAM 0.5 MG PO TABS: ORAL_TABLET | ORAL | Status: DC

## 2016-04-04 ENCOUNTER — Other Ambulatory Visit: Payer: Self-pay | Admitting: Family Medicine

## 2016-04-04 NOTE — Telephone Encounter (Signed)
It was last filled for #30 on 5/11.  Okay to refill #30, no refill,but advise pt that it appears that his use of this medication has not decreased as expected with his change in job, and that we will need to discuss this further at his follow-up in July.  It appears that he is still needing this daily, so other options may need to be discussed

## 2016-04-04 NOTE — Telephone Encounter (Signed)
Is this okay to refill? 

## 2016-04-04 NOTE — Telephone Encounter (Signed)
Phoned in and [facility]

## 2016-04-25 ENCOUNTER — Other Ambulatory Visit: Payer: Self-pay | Admitting: Family Medicine

## 2016-04-25 ENCOUNTER — Telehealth: Payer: Self-pay | Admitting: Family Medicine

## 2016-04-25 ENCOUNTER — Encounter: Payer: Self-pay | Admitting: Family Medicine

## 2016-04-25 DIAGNOSIS — E7800 Pure hypercholesterolemia, unspecified: Secondary | ICD-10-CM

## 2016-04-25 MED ORDER — ATORVASTATIN CALCIUM 20 MG PO TABS: ORAL_TABLET | ORAL | Status: DC

## 2016-04-25 MED ORDER — LISINOPRIL 20 MG PO TABS: 20.0000 mg | ORAL_TABLET | Freq: Every day | ORAL | Status: DC

## 2016-04-25 NOTE — Telephone Encounter (Signed)
Pt need refills on Atorvastatin 20 mg & Lisinopril 20 mg ASAP today since he out of meds  & is leaving to go out of town at 1:00 pm today.

## 2016-04-25 NOTE — Addendum Note (Signed)
Addended by: Minette Headland A on: 04/25/2016 11:31 AM   Modules accepted: Orders

## 2016-04-25 NOTE — Telephone Encounter (Signed)
error 

## 2016-04-25 NOTE — Addendum Note (Signed)
Addended by: Minette Headland A on: 04/25/2016 11:35 AM   Modules accepted: Orders

## 2016-04-25 NOTE — Telephone Encounter (Signed)
Refilled for 30 days 

## 2016-05-18 ENCOUNTER — Encounter: Payer: Self-pay | Admitting: Family Medicine

## 2016-05-19 ENCOUNTER — Other Ambulatory Visit: Payer: BLUE CROSS/BLUE SHIELD

## 2016-05-20 ENCOUNTER — Other Ambulatory Visit: Payer: Self-pay | Admitting: Family Medicine

## 2016-05-20 DIAGNOSIS — E7800 Pure hypercholesterolemia, unspecified: Secondary | ICD-10-CM

## 2016-05-21 ENCOUNTER — Encounter: Payer: BLUE CROSS/BLUE SHIELD | Admitting: Family Medicine

## 2016-05-26 ENCOUNTER — Other Ambulatory Visit: Payer: Self-pay | Admitting: Family Medicine

## 2016-05-26 MED ORDER — ALPRAZOLAM 0.5 MG PO TABS: 0.5000 mg | ORAL_TABLET | Freq: Every day | ORAL | 0 refills | Status: DC | PRN

## 2016-06-23 ENCOUNTER — Other Ambulatory Visit: Payer: 59

## 2016-06-23 ENCOUNTER — Other Ambulatory Visit: Payer: Self-pay | Admitting: Family Medicine

## 2016-06-23 DIAGNOSIS — E7800 Pure hypercholesterolemia, unspecified: Secondary | ICD-10-CM

## 2016-06-23 DIAGNOSIS — Z1159 Encounter for screening for other viral diseases: Secondary | ICD-10-CM

## 2016-06-23 DIAGNOSIS — R7301 Impaired fasting glucose: Secondary | ICD-10-CM

## 2016-06-23 DIAGNOSIS — I10 Essential (primary) hypertension: Secondary | ICD-10-CM

## 2016-06-23 DIAGNOSIS — M109 Gout, unspecified: Secondary | ICD-10-CM

## 2016-06-23 DIAGNOSIS — Z5181 Encounter for therapeutic drug level monitoring: Secondary | ICD-10-CM

## 2016-06-23 LAB — CBC WITH DIFFERENTIAL/PLATELET
BASOS ABS: 65 {cells}/uL (ref 0–200)
Basophils Relative: 1 %
EOS ABS: 130 {cells}/uL (ref 15–500)
Eosinophils Relative: 2 %
HEMATOCRIT: 45.8 % (ref 38.5–50.0)
Hemoglobin: 15.4 g/dL (ref 13.2–17.1)
LYMPHS PCT: 26 %
Lymphs Abs: 1690 cells/uL (ref 850–3900)
MCH: 31.2 pg (ref 27.0–33.0)
MCHC: 33.6 g/dL (ref 32.0–36.0)
MCV: 92.9 fL (ref 80.0–100.0)
MONO ABS: 780 {cells}/uL (ref 200–950)
MONOS PCT: 12 %
MPV: 10.6 fL (ref 7.5–12.5)
NEUTROS PCT: 59 %
Neutro Abs: 3835 cells/uL (ref 1500–7800)
PLATELETS: 289 10*3/uL (ref 140–400)
RBC: 4.93 MIL/uL (ref 4.20–5.80)
RDW: 13.3 % (ref 11.0–15.0)
WBC: 6.5 10*3/uL (ref 4.0–10.5)

## 2016-06-23 LAB — LIPID PANEL
CHOL/HDL RATIO: 3.2 ratio (ref <=5.0)
Cholesterol: 220 mg/dL — ABNORMAL HIGH (ref 125–200)
HDL: 69 mg/dL (ref >=40)
LDL CALC: 134 mg/dL — AB (ref <130)
Triglycerides: 83 mg/dL (ref <150)
VLDL: 17 mg/dL (ref <30)

## 2016-06-23 LAB — COMPREHENSIVE METABOLIC PANEL
ALBUMIN: 4.4 g/dL (ref 3.6–5.1)
ALT: 53 U/L — ABNORMAL HIGH (ref 9–46)
AST: 29 U/L (ref 10–35)
Alkaline Phosphatase: 58 U/L (ref 40–115)
BUN: 17 mg/dL (ref 7–25)
CALCIUM: 9.6 mg/dL (ref 8.6–10.3)
CHLORIDE: 105 mmol/L (ref 98–110)
CO2: 26 mmol/L (ref 20–31)
Creat: 1.22 mg/dL (ref 0.70–1.25)
Glucose, Bld: 106 mg/dL — ABNORMAL HIGH (ref 65–99)
Potassium: 5.2 mmol/L (ref 3.5–5.3)
Sodium: 140 mmol/L (ref 135–146)
Total Bilirubin: 0.7 mg/dL (ref 0.2–1.2)
Total Protein: 6.8 g/dL (ref 6.1–8.1)

## 2016-06-23 LAB — TSH: TSH: 2.02 m[IU]/L (ref 0.40–4.50)

## 2016-06-23 LAB — HEMOGLOBIN A1C
HEMOGLOBIN A1C: 5.3 % (ref <5.7)
Mean Plasma Glucose: 105 mg/dL

## 2016-06-23 LAB — URIC ACID: Uric Acid, Serum: 6.6 mg/dL (ref 4.0–8.0)

## 2016-06-24 LAB — HEPATITIS C ANTIBODY: HCV AB: NEGATIVE

## 2016-06-24 NOTE — Progress Notes (Signed)
Chief Complaint  Patient presents with  . Hypertension    nonfasting med check.    Hypertension:  BP's at home remain good in the evenings (115-120's/70's-80), high in the mornings (150-160/90), slightly lower at noon.   He does feel stressed about work starting when he wakes up in the morning, but feels more relaxed in the evenings. He denies any headaches, chest pain, palpitations, or side effects of meds. See below re: anxiety.  Hyperlipidemia follow-up: Patient is reportedly following a low-fat, low cholesterol diet. Compliant with medications and denies medication side effects. [place] getting there recently), mostly chicken and fish when home, doesn't eat fried foods. Red meat about once a week or less.  Eggs on the weekends (2/weekend).  Gout:  Tolerating allopurinol without side effects, and no further gout flares.  He had HCTZ removed from the lisinopril combo, and was started on allopurinol, and doing well since these changes were made.  GERD: He backed down on PPI to just as needed, and sometimes has gone 3-5 days without any recurrent symptoms. Seems to be worse when he eats late dinners. He has elevated the head of his bed. Denies dysphagia. He usually takes it every other day with good results.  Denies heartburn, just sometimes will wake up and feel like the food isn't digesting, feels full in the chest.  No dysphagia or choking, just "can tell it is there"--worse when he skips doses. No longer having any fluttering sensation like he used to. Trying to eat earlier but not always able to.  IFG: Last A1c was 5.6 in January. Tries to limit his sweets.  Drinking a little more alcohol than in the past (up to 2-3/night). He has had some mild elevations in LFT's in the past.  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  Will travel some more for work. This is not binding, so he can leave if he finds himself unhappy. He was hoping to be able to take more time for himself, exercise, travel, but this was not the case. Eventually plans to sell large yachts.  The commitment to his job is more than he anticipated. Debating whether the money is worth the stress. He travels a lot, does a lot of public speaking (which causes him anxiety)--he is in a more public role than he feels comfortable with.  His current alprazolam use includes taking 1/2 every morning during the week (just feels better that way), sometimes needing 1/2 tablet later in the day. Doesn't take it on the weekends. Takes it prior to public speaking--reports getting performance anxiety.  It appears that he has continued to need alprazolam fairly regularly since his last visit.  Refill history of #30 alprazolam include the following dates: 1/19, 2/27, 3/30, 5/11, 6/9 and last refill was 7/31. He denies needing a refill currently.  He is seriously considering taking 2 weeks to go to [facility] (no alcohol, yoga, exercise, relaxation).  Exercise--[place] a lot. He is riding 3-4 hours on his bike with his wife on the weekends,  both days, weather permitting, but not getting any other regular exercise.  Prostate cancer--under care of Dr. Karsten Ro. S/p radioactive seed implant. Denies ED or other side effects.  He was told at his last visit that everything was great, and didn't need to be seen again for 2 years (previously saw him every 6 months).   PMH, PSH, SH, FH were updated and reviewed.  Outpatient Encounter Prescriptions as of 06/25/2016  Medication Sig  . allopurinol (ZYLOPRIM) 100 MG tablet TAKE 1 TABLET (100 MG TOTAL) BY MOUTH DAILY.  Marland Kitchen  ALPRAZolam (XANAX) 0.5 MG tablet Take 1 tablet (0.5 mg total) by mouth daily as needed for anxiety.  Marland Kitchen atorvastatin (LIPITOR) 20 MG tablet TAKE ONE TABLET BY MOUTH DAILY  . omeprazole (PRILOSEC) 40 MG capsule TAKE 1 CAPSULE (40 MG TOTAL) BY MOUTH DAILY.  Marland Kitchen lisinopril (PRINIVIL,ZESTRIL) 20 MG tablet TAKE ONE TABLET BY MOUTH DAILY  . [DISCONTINUED] allopurinol (ZYLOPRIM) 100 MG tablet Take 1 tablet (100 mg total) by mouth daily.   No facility-administered encounter medications on file as of 06/25/2016.     ROS: no fever, chills, URI symptoms, headaches, dizziness, chest pain, palpitations, nausea, vomiting, abdominal pain, bowel changes, urinary complaints, bleeding, bruising, rash, depression, anxiety, chest pain, shortness of breath  PHYSICAL EXAM:  BP (!) 150/100 (BP Location: Left Arm, Patient Position: Sitting, Cuff Size: Normal)   Pulse 68   Ht 5\' 10"  (1.778 m)   Wt 190 lb (86.2 kg)   BMI 27.26 kg/m   152/91 pt's wrist monitor P 71, L side 154/96 on repeat by MD, L arm  (nurse got 150/100 his machine 141/86).  Well developed, pleasant male in no distress.  HEENT: PERRL, EOMI, conjunctiva and sclera are clear. OP is clear. Neck: no lymphadenopathy, thyromegaly or mass, no carotid bruit. Heart: regular rate and rhythm without murmur  Lungs: clear bilaterally  Abdomen: soft, nontender, no mass, no organomegaly Extremities: no edema, 2+ pulses Skin: no rash  Psych: normal mood, affect, hygiene and grooming. Appears relaxed, not anxious. Neuro: alert and oriented. Normal strength, sensation, gait; cranial nerves grossly intact  Lab Results  Component Value Date   HGBA1C 5.3 06/23/2016   Lab Results  Component Value Date   LABURIC 6.6 06/23/2016   Lab Results  Component Value Date   CHOL 220 (H) 06/23/2016   HDL 69 06/23/2016   LDLCALC 134 (H) 06/23/2016   TRIG 83 06/23/2016   CHOLHDL 3.2 06/23/2016     Chemistry      Component Value Date/Time   NA 140  06/23/2016 0753   K 5.2 06/23/2016 0753   CL 105 06/23/2016 0753   CO2 26 06/23/2016 0753   BUN 17 06/23/2016 0753   CREATININE 1.22 06/23/2016 0753      Component Value Date/Time   CALCIUM 9.6 06/23/2016 0753   ALKPHOS 58 06/23/2016 0753   AST 29 06/23/2016 0753   ALT 53 (H) 06/23/2016 0753   BILITOT 0.7 06/23/2016 0753     Fasting glucose 106  Lab Results  Component Value Date   WBC 6.5 06/23/2016   HGB 15.4 06/23/2016   HCT 45.8 06/23/2016   MCV 92.9 06/23/2016   PLT 289 06/23/2016   Lab Results  Component Value Date   TSH 2.02 06/23/2016   Negative Hepatitis C Ab  ASSESSMENT/PLAN:  Pure hypercholesterolemia - borderline; recommended increasing atorvastatin to 40mg  daily. - Plan: atorvastatin (LIPITOR) 40 MG tablet  Essential hypertension, benign - elevated today, and most mornings, partly related to  anxiety. Start lexapro for anxiety, and then add beta blocker--to help with HTN and performance anxiety - Plan: lisinopril (PRINIVIL,ZESTRIL) 20 MG tablet, atenolol (TENORMIN) 25 MG tablet  IFG (impaired fasting glucose) - A1c improved, fasting sugar still mildly elevated  Gastroesophageal reflux disease without esophagitis - controlled; continue PPI - Plan: omeprazole (PRILOSEC) 40 MG capsule  Anxiety state - requiring daily xanax--long-term, as likely will stay at same job for at least 7 more mos. trial lexapro and hope to decrease alprazolam use. risks reviewed - Plan: escitalopram (LEXAPRO) 10 MG tablet  Gout of right foot, unspecified cause, unspecified chronicity - doing well on allopurinol without flares  Elevated LFTs - encouraged to cut back on alcohol.  recheck in 2-3 mos (with lipids)  Performance anxiety - will use beta blocker daily to help with both BP and anxiety (vs short-acting propranolol prn only) - Plan: atenolol (TENORMIN) 25 MG tablet  Need for prophylactic vaccination and inoculation against influenza - Plan: Flu Vaccine QUAD 36+ mos PF IM  (Fluarix & Fluzone Quad PF)  Current job is much more stressful than he anticipated. Plans to finish the year (7 months left) and then may either leave vs consider cutting back to 20 hrs/week Debating 2 weeks at [facility] for wellness in the near future Counseled re: risks of stress/anxiety on his health, ways to treat and prevent.   Start taking lexapro (escitalopram) 1/2 tablet once daily.  Take it 1/2 tablet for a week, and after a week, if tolerating it, increase to the full tablet.  If any side effects, continue at the 1/2 tablet for longer. Most people can take in the morning, and not have any problems. If it at all makes you sleepy, change it to the evening.  Increase the atorvastatin to 40mg .  You can double up on any remaining 20mg  tablets that you have.  Continue to follow a low cholesterol, lowfat diet.  If you develop muscle aches, start taking coenzyme Q10 daily.  That should help. If you still are having a lot of pain and side effects from the atorvastatin, please contact us.  Your liver tests remain mildly elevated.  Please cut back on the alcohol.  No more than 2 drinks/day, consider just 1/day or less.  After you have been on the lexapro and the higher dose of atorvastatin for a couple of weeks, and know the side effects and how you feel, then I recommend starting the new blood pressure medication, which will also help some with performance anxiety. Take this once daily.  When you monitor your blood pressure, please also record the pulse.    Return in 6 weeks to follow up on the anxiety, blood pressure. We can schedule your cholesterol follow up at that time (usually done 2-3 months after a dose change).   40 minute visit, more than 1/2 spent counseling re: side effects, new medications, risks of meds, stress reduction, QOL issues, etc. All questions answered.

## 2016-06-25 ENCOUNTER — Encounter: Payer: Self-pay | Admitting: Family Medicine

## 2016-06-25 ENCOUNTER — Ambulatory Visit (INDEPENDENT_AMBULATORY_CARE_PROVIDER_SITE_OTHER): Payer: 59 | Admitting: Family Medicine

## 2016-06-25 VITALS — BP 150/100 | HR 68 | Ht 70.0 in | Wt 190.0 lb

## 2016-06-25 DIAGNOSIS — Z23 Encounter for immunization: Secondary | ICD-10-CM

## 2016-06-25 DIAGNOSIS — R7989 Other specified abnormal findings of blood chemistry: Secondary | ICD-10-CM

## 2016-06-25 DIAGNOSIS — M109 Gout, unspecified: Secondary | ICD-10-CM | POA: Diagnosis not present

## 2016-06-25 DIAGNOSIS — F411 Generalized anxiety disorder: Secondary | ICD-10-CM | POA: Diagnosis not present

## 2016-06-25 DIAGNOSIS — E7800 Pure hypercholesterolemia, unspecified: Secondary | ICD-10-CM | POA: Diagnosis not present

## 2016-06-25 DIAGNOSIS — F419 Anxiety disorder, unspecified: Secondary | ICD-10-CM

## 2016-06-25 DIAGNOSIS — R7301 Impaired fasting glucose: Secondary | ICD-10-CM | POA: Diagnosis not present

## 2016-06-25 DIAGNOSIS — I10 Essential (primary) hypertension: Secondary | ICD-10-CM | POA: Diagnosis not present

## 2016-06-25 DIAGNOSIS — F418 Other specified anxiety disorders: Secondary | ICD-10-CM

## 2016-06-25 DIAGNOSIS — K219 Gastro-esophageal reflux disease without esophagitis: Secondary | ICD-10-CM | POA: Diagnosis not present

## 2016-06-25 DIAGNOSIS — R945 Abnormal results of liver function studies: Secondary | ICD-10-CM

## 2016-06-25 MED ORDER — ATENOLOL 25 MG PO TABS: 25.0000 mg | ORAL_TABLET | Freq: Every day | ORAL | 3 refills | Status: DC

## 2016-06-25 MED ORDER — ATORVASTATIN CALCIUM 40 MG PO TABS: 40.0000 mg | ORAL_TABLET | Freq: Every day | ORAL | 2 refills | Status: DC

## 2016-06-25 MED ORDER — LISINOPRIL 20 MG PO TABS: 20.0000 mg | ORAL_TABLET | Freq: Every day | ORAL | 5 refills | Status: DC

## 2016-06-25 MED ORDER — OMEPRAZOLE 40 MG PO CPDR: DELAYED_RELEASE_CAPSULE | ORAL | 5 refills | Status: DC

## 2016-06-25 MED ORDER — ESCITALOPRAM OXALATE 10 MG PO TABS: ORAL_TABLET | ORAL | 1 refills | Status: DC

## 2016-06-25 NOTE — Patient Instructions (Addendum)
   Start taking lexapro (escitalopram) 1/2 tablet once daily.  Take it 1/2 tablet for a week, and after a week, if tolerating it, increase to the full tablet.  If any side effects, continue at the 1/2 tablet for longer. Most people can take in the morning, and not have any problems. If it at all makes you sleepy, change it to the evening.  Increase the atorvastatin to 40mg .  You can double up on any remaining 20mg  tablets that you have.  Continue to follow a low cholesterol, lowfat diet.  If you develop muscle aches, start taking coenzyme Q10 daily.  That should help. If you still are having a lot of pain and side effects from the atorvastatin, please contact us.  Your liver tests remain mildly elevated.  Please cut back on the alcohol.  No more than 2 drinks/day, consider just 1/day or less.  After you have been on the lexapro and the higher dose of atorvastatin for a couple of weeks, and know the side effects and how you feel, then I recommend starting the new blood pressure medication, which will also help some with performance anxiety. Take this once daily.  When you monitor your blood pressure, please also record the pulse.    Return in 6 weeks to follow up on the anxiety, blood pressure. We can schedule your cholesterol follow up at that time (usually done 2-3 months after a dose change).  Please be sure to continue to follow a low sodium diet.  Try and exercise 30 minutes daily.  This will help with blood pressure, stress, and reduce risks for heart disease.

## 2016-07-07 ENCOUNTER — Encounter: Payer: Self-pay | Admitting: Family Medicine

## 2016-07-07 ENCOUNTER — Other Ambulatory Visit: Payer: Self-pay | Admitting: Family Medicine

## 2016-07-07 MED ORDER — ALPRAZOLAM 0.5 MG PO TABS: 0.5000 mg | ORAL_TABLET | Freq: Every day | ORAL | 0 refills | Status: DC | PRN

## 2016-07-24 ENCOUNTER — Encounter: Payer: Self-pay | Admitting: Family Medicine

## 2016-08-07 ENCOUNTER — Encounter: Payer: 59 | Admitting: Family Medicine

## 2016-08-10 ENCOUNTER — Other Ambulatory Visit: Payer: Self-pay | Admitting: Family Medicine

## 2016-08-11 MED ORDER — ALPRAZOLAM 0.5 MG PO TABS: 0.5000 mg | ORAL_TABLET | Freq: Every day | ORAL | 0 refills | Status: DC | PRN

## 2016-08-11 NOTE — Telephone Encounter (Signed)
Last filled 9/11.  Looks like he is due for f/u with me. Looks like he rescheduled his 10/19 (and 10/12) appts, now scheduled for 10/25.  [facility] for #30, no refill.  He must come next week for follow up--I don't want him to continue to use 30/month without discussing in more detail

## 2016-08-11 NOTE — Telephone Encounter (Signed)
Is this okay to refill? 

## 2016-08-14 ENCOUNTER — Encounter: Payer: 59 | Admitting: Family Medicine

## 2016-08-19 NOTE — Progress Notes (Deleted)
  Patient presents for follow up on hypertension, hyperlipidemia and anxiety. Per email 9/28, he reported: --"experiencing no side effects from the increased dosage of atorvastatin or the new intake of atenolol. My blood pressure is high when I first wake up (will share on my records later) and within 30 minutes of taking atenolol, it drops to normal. It slowly rises toward the end of the day. If I drink 2 glasses of wine (disciplining myself) in the evening, it drops below 120 over 80.   I am fairly confident that escitalopram is the cause for (1) severe diarrhea (36 hours), (2) nausea, (3) loss of appetite, (4) increased reflux symptoms, and (5) jitterness. I stopped and all of these conditions subsided. Further, I was very concerned about higher than normal blood pressure readings whereby the high reading in some cases hit 180 and yet the low stayed at 85. I have not seen these readings since I stopped taking this medication on Saturday, Sept 24 the first day that these symptoms were intense and causing me to not eat and stay in bed. By Tuesday, Sept 26, I was much better"   Hypertension:  Atenolol 25mg  was added to his lisinopril.  BP's at home in the evenings are running: 115-120's/70's-80; in the mornings: (150-160/90), slightly lower at noon.   He does feel stressed about work starting when he wakes up in the morning, but feels more relaxed in the evenings. He denies any headaches, chest pain, palpitations, or side effects of meds.   Hyperlipidemia follow-up: Atorvastatin dose was increased to 40mg  in September. He is tolerating this without side effects..  Patient is reportedly following a low-fat, low cholesterol diet. Compliant with medications and denies medication side effects. Diet remains about the same--[place] there recently), mostly chicken and fish when home, doesn't eat fried foods. Red meat about once a week or less. Eggs  on the weekends (2/weekend).  Anxiety:  He did not tolerate the lexapro, as described above (diarrhea, nausea, worsened reflux symptoms, decreased appetite, jitteriness).  His current alprazolam use includes taking 1/2 every morning during the week (just feels better that way), sometimes needing 1/2 tablet later in the day. Doesn't take it on the weekends. Takes it prior to public speaking--reports getting performance anxiety.    Schedule lipids/liver

## 2016-08-20 ENCOUNTER — Encounter: Payer: 59 | Admitting: Family Medicine

## 2016-08-26 NOTE — Progress Notes (Signed)
Chief Complaint  Patient presents with  . Hypertension    fasting med check.    Patient presents for follow up on hypertension, hyperlipidemia and anxiety.  Thinking about giving notice to leave his job 02/21/17 vs earlier (finishing one year of the contract, rather than 3). Admits that he really doesn't like his current job, doesn't like working for others, not having the flexibility in his job (including ability to travel to [employer]) that he is used to having.  Per email 9/28, he reported: --"experiencing no side effects from the increased dosage of atorvastatin or the new intake of atenolol. My blood pressure is high when I first wake up (will share on my records later) and within 30 minutes of taking atenolol, it drops to normal. It slowly rises toward the end of the day. If I drink 2 glasses of wine (disciplining myself) in the evening, it drops below 120 over 80.   I am fairly confident that escitalopram is the cause for (1) severe diarrhea (36 hours), (2) nausea, (3) loss of appetite, (4) increased reflux symptoms, and (5) jitterness. I stopped and all of these conditions subsided. Further, I was very concerned about higher than normal blood pressure readings whereby the high reading in some cases hit 180 and yet the low stayed at 85. I have not seen these readings since I stopped taking this medication on Saturday, Sept 24 the first day that these symptoms were intense and causing me to not eat and stay in bed. By Tuesday, Sept 26, I was much better"   Hypertension:  Atenolol 25mg  was added to his lisinopril. He notes that the morning BP's recently (with new monitor) have been 130/85 (today),  141/83, 135/87, 159/92 (on 10/3).  He recalls seeing 92/52 once (doesn't recall feeling dizzy or bad).  Evening BP's have been 126/72,141/79, 154/98, 138/85, 133/79, 147/86. Once was 133/77 at 2pm.  Pulse is running 70-80 mostly, low of 66, up to 91 once BP readings with old monitor ranged from  127/83 up to 181/81 (when taking lexapro, feeling badly, as above).   Hyperlipidemia follow-up: Atorvastatin dose was increased to 40mg  in September. He is tolerating this without side effects..  Patient is reportedly following a low-fat, low cholesterol diet. Compliant with medications and denies medication side effects. Diet remains about the same--[place], mostly chicken and fish when home, doesn't eat fried foods. Red meat about once a week or less. Eggs on the weekends (2/weekend).  Anxiety:  He did not tolerate the lexapro, as described above (diarrhea, nausea, worsened reflux symptoms, decreased appetite, jitteriness). He took it for 2 weeks.  He was okay on the half tablet for the first week, but couldn't tolerate the side effects after he increased to the full tablet. His current alprazolam use includes taking 1/2 most mornings if it is a very busy day (not out of habit taking 1/2 every morning like he used to), just during the week, sometimes needing 1/2 tablet later in the day. Doesn't take it on the weekends. Takes it prior to public speaking--reports getting performance anxiety. Denies sedation, and it is very effective.  He has had a cough.  Started out as a head cold about 4 weeks ago.  It cleared up, but with the temperature drop this weekend his cough recurred.  He has a slight runny nose, mostly cleared.  Phlegm is discolored, mostly in the morning/night. Denies itchy eyes, sneezing.  No h/o allergies. Denies  fevers, sick contacts He has taken some mucinex, no other OTC meds  PMH, PSH, SH reviewed  Outpatient Encounter Prescriptions as of 08/27/2016  Medication Sig Note  . allopurinol (ZYLOPRIM) 100 MG tablet TAKE 1 TABLET (100 MG TOTAL) BY MOUTH DAILY.   Marland Kitchen ALPRAZolam (XANAX) 0.5 MG tablet Take 1 tablet (0.5 mg total) by mouth daily as needed for anxiety.   Marland Kitchen atenolol (TENORMIN) 25 MG tablet Take 1 tablet (25 mg total) by mouth daily.   Marland Kitchen  atorvastatin (LIPITOR) 40 MG tablet Take 1 tablet (40 mg total) by mouth daily.   Marland Kitchen lisinopril (PRINIVIL,ZESTRIL) 20 MG tablet Take 1 tablet (20 mg total) by mouth daily.   Marland Kitchen omeprazole (PRILOSEC) 40 MG capsule TAKE 1 CAPSULE (40 MG TOTAL) BY MOUTH DAILY.   . benzonatate (TESSALON) 200 MG capsule Take 1 capsule (200 mg total) by mouth 3 (three) times daily as needed.   Marland Kitchen escitalopram (LEXAPRO) 10 MG tablet Start 1/2 tablet by mouth once daily every morning. Increase to full tablet after a week if tolerated (Patient not taking: Reported on 08/27/2016) 08/27/2016: Took x 2 weeks, not recently   No facility-administered encounter medications on file as of 08/27/2016.    No Known Allergies  ROS:  No fever, chills, chest pain, dizziness, nausea, vomiting, diarrhea (none recent), bleeding, bruising, rash, shortness of breath. See HPI re: anxiety, URI symptoms/cough.  PHYSICAL EXAM:  BP (!) 142/90 (BP Location: Left Arm, Patient Position: Sitting, Cuff Size: Normal)   Pulse 76   Ht 5\' 10"  (1.778 m)   Wt 186 lb (84.4 kg)   BMI 26.69 kg/m    New monitor (omron wrist) 148/92 just after nurse checked (and got 142/90)  142/84 on repeat by MD; 135/86 on pt's wrist monitor  Well developed, pleasant, well-appearing male in no distress Some cough and hoarseness is noted HEENT: PERRL, EOMI, conjunctiva and sclera are clear.  TM's and EAC's are normal. Nose--mild-mod edematous with clear mucus L>R Sinuses nontender OP normal, slight clear drainage noted Neck: no lymphadenopathy or mass Heart: regular rate and rhythm Lungs: clear bilaterally Abdomen: soft, nontender, no organomegaly or mass Extremities: no edema Neuro: alert and oriented, cranial nerves intact, normal strength, gait Psych: normal mood, affect, hygiene and grooming.  ASSESSMENT/PLAN:  Cough - related to PND/URI. no evidence of bacterial infection.  Supportive measures reviewed. - Plan: benzonatate (TESSALON) 200 MG capsule  Pure  hypercholesterolemia - recheck lipids next month; continue 40mg  atorvastatin - Plan: Lipid panel, Hepatic function panel  Essential hypertension, benign - improved control overall; new monitor is accurate. Continue current med regimen. Should improve further when less stress  Anxiety state - exit plan for work should help. Will retry lexapro, but remain at lower dose (for longer, or not increase if doing well)   35 min visit, more than 1/2 spent counseling   Drink plenty of fluids. Continue Mucinex.  If the kind you have is the plain kind, then add Delsym syrup as needed as a cough suppressant (use at night, and during the day, if needed).  Add either Coricidin HBP or an antihistamine such as zyrtec, claritin or allegra once daily to help dry up the drainage.  Avoid decongestants (OTC medications such as Dayquil, sudafed, etc), as these will raise your blood pressure. Use sinus rinses if you develop sinus pressure/pain. You may use the benzonatate during the day as needed for cough (shouldn't make you sleepy, can also be used at night, if needed).  Re-try the escitalopram  at 1/2 tablet.  Stay on the 1/2 pill for at least 2-3 weeks.  If it is helping with the anxiety, there is no reason to increase to the full tablet. If it isn't helping enough, consider re-trying the full pill (no rush).

## 2016-08-27 ENCOUNTER — Encounter: Payer: Self-pay | Admitting: Family Medicine

## 2016-08-27 ENCOUNTER — Ambulatory Visit (INDEPENDENT_AMBULATORY_CARE_PROVIDER_SITE_OTHER): Payer: 59 | Admitting: Family Medicine

## 2016-08-27 VITALS — BP 142/84 | HR 76 | Ht 70.0 in | Wt 186.0 lb

## 2016-08-27 DIAGNOSIS — R05 Cough: Secondary | ICD-10-CM | POA: Diagnosis not present

## 2016-08-27 DIAGNOSIS — I10 Essential (primary) hypertension: Secondary | ICD-10-CM | POA: Diagnosis not present

## 2016-08-27 DIAGNOSIS — E7800 Pure hypercholesterolemia, unspecified: Secondary | ICD-10-CM

## 2016-08-27 DIAGNOSIS — R059 Cough, unspecified: Secondary | ICD-10-CM

## 2016-08-27 DIAGNOSIS — F411 Generalized anxiety disorder: Secondary | ICD-10-CM | POA: Diagnosis not present

## 2016-08-27 MED ORDER — BENZONATATE 200 MG PO CAPS: 200.0000 mg | ORAL_CAPSULE | Freq: Three times a day (TID) | ORAL | 0 refills | Status: DC | PRN

## 2016-08-27 NOTE — Patient Instructions (Signed)
   Drink plenty of fluids. Continue Mucinex.  If the kind you have is the plain kind, then add Delsym syrup as needed as a cough suppressant (use at night, and during the day, if needed).  Add either Coricidin HBP or an antihistamine such as zyrtec, claritin or allegra once daily to help dry up the drainage.  Avoid decongestants (OTC medications such as Dayquil, sudafed, etc), as these will raise your blood pressure. Use sinus rinses if you develop sinus pressure/pain. You may use the benzonatate during the day as needed for cough (shouldn't make you sleepy, can also be used at night, if needed).  Re-try the escitalopram at 1/2 tablet.  Stay on the 1/2 pill for at least 2-3 weeks.  If it is helping with the anxiety, there is no reason to increase to the full tablet. If it isn't helping enough, consider re-trying the full pill (no rush).

## 2016-09-12 ENCOUNTER — Other Ambulatory Visit: Payer: Self-pay | Admitting: Family Medicine

## 2016-09-12 MED ORDER — ALPRAZOLAM 0.5 MG PO TABS: 0.5000 mg | ORAL_TABLET | Freq: Every day | ORAL | 0 refills | Status: DC | PRN

## 2016-09-12 NOTE — Telephone Encounter (Signed)
Ok to refill #30

## 2016-09-12 NOTE — Telephone Encounter (Signed)
Called in med pharmacy  

## 2016-09-12 NOTE — Telephone Encounter (Signed)
Pt is requesting a refill on this

## 2016-09-22 ENCOUNTER — Other Ambulatory Visit: Payer: Self-pay | Admitting: Family Medicine

## 2016-09-22 DIAGNOSIS — E7800 Pure hypercholesterolemia, unspecified: Secondary | ICD-10-CM

## 2016-09-29 ENCOUNTER — Other Ambulatory Visit: Payer: 59

## 2016-10-13 ENCOUNTER — Encounter: Payer: Self-pay | Admitting: Family Medicine

## 2016-10-13 ENCOUNTER — Other Ambulatory Visit: Payer: 59

## 2016-10-13 ENCOUNTER — Other Ambulatory Visit: Payer: Self-pay | Admitting: *Deleted

## 2016-10-13 DIAGNOSIS — E7800 Pure hypercholesterolemia, unspecified: Secondary | ICD-10-CM

## 2016-10-13 DIAGNOSIS — I10 Essential (primary) hypertension: Secondary | ICD-10-CM

## 2016-10-13 LAB — HEPATIC FUNCTION PANEL
ALT: 53 U/L — AB (ref 9–46)
AST: 33 U/L (ref 10–35)
Albumin: 4.3 g/dL (ref 3.6–5.1)
Alkaline Phosphatase: 65 U/L (ref 40–115)
BILIRUBIN DIRECT: 0.2 mg/dL (ref <=0.2)
BILIRUBIN TOTAL: 1.3 mg/dL — AB (ref 0.2–1.2)
Indirect Bilirubin: 1.1 mg/dL (ref 0.2–1.2)
Total Protein: 6.8 g/dL (ref 6.1–8.1)

## 2016-10-13 LAB — LIPID PANEL
CHOL/HDL RATIO: 3.6 ratio (ref <5.0)
CHOLESTEROL: 206 mg/dL — AB (ref <200)
HDL: 58 mg/dL (ref >40)
LDL CALC: 109 mg/dL — AB (ref <100)
Triglycerides: 196 mg/dL — ABNORMAL HIGH (ref <150)
VLDL: 39 mg/dL — AB (ref <30)

## 2016-10-13 MED ORDER — LISINOPRIL 20 MG PO TABS: 20.0000 mg | ORAL_TABLET | Freq: Every day | ORAL | 0 refills | Status: DC

## 2016-10-13 MED ORDER — ATORVASTATIN CALCIUM 40 MG PO TABS: 40.0000 mg | ORAL_TABLET | Freq: Every day | ORAL | 0 refills | Status: DC

## 2016-10-16 ENCOUNTER — Other Ambulatory Visit: Payer: Self-pay | Admitting: Family Medicine

## 2016-10-16 MED ORDER — ALPRAZOLAM 0.5 MG PO TABS: 0.5000 mg | ORAL_TABLET | Freq: Every day | ORAL | 0 refills | Status: DC | PRN

## 2016-11-19 ENCOUNTER — Other Ambulatory Visit: Payer: Self-pay | Admitting: Family Medicine

## 2016-11-19 DIAGNOSIS — E7800 Pure hypercholesterolemia, unspecified: Secondary | ICD-10-CM

## 2016-12-04 ENCOUNTER — Other Ambulatory Visit: Payer: Self-pay | Admitting: Family Medicine

## 2016-12-05 MED ORDER — ALPRAZOLAM 0.5 MG PO TABS: 0.5000 mg | ORAL_TABLET | Freq: Every day | ORAL | 0 refills | Status: DC | PRN

## 2016-12-05 NOTE — Telephone Encounter (Signed)
Called in med 

## 2017-01-07 ENCOUNTER — Encounter: Payer: BLUE CROSS/BLUE SHIELD | Admitting: Family Medicine

## 2017-01-13 ENCOUNTER — Other Ambulatory Visit: Payer: Self-pay | Admitting: Family Medicine

## 2017-01-14 ENCOUNTER — Other Ambulatory Visit: Payer: Self-pay | Admitting: Family Medicine

## 2017-01-14 DIAGNOSIS — I10 Essential (primary) hypertension: Secondary | ICD-10-CM

## 2017-01-14 MED ORDER — LISINOPRIL 20 MG PO TABS: 20.0000 mg | ORAL_TABLET | Freq: Every day | ORAL | 0 refills | Status: DC

## 2017-01-14 MED ORDER — ALPRAZOLAM 0.5 MG PO TABS: 0.5000 mg | ORAL_TABLET | Freq: Every day | ORAL | 0 refills | Status: DC | PRN

## 2017-01-14 NOTE — Telephone Encounter (Signed)
Looks like he canceled his CPE on 3/14, rescheduled to June. I'd like to see him for a fasting med check within the next month.  Okay to refill #30, no additional refills

## 2017-01-23 ENCOUNTER — Other Ambulatory Visit: Payer: Self-pay | Admitting: Family Medicine

## 2017-01-23 DIAGNOSIS — K219 Gastro-esophageal reflux disease without esophagitis: Secondary | ICD-10-CM

## 2017-01-23 DIAGNOSIS — E7800 Pure hypercholesterolemia, unspecified: Secondary | ICD-10-CM

## 2017-02-10 NOTE — Progress Notes (Signed)
Chief Complaint  Patient presents with  . Hypertension    fasting med check.    Patient presents for follow up on hypertension, hyperlipidemia and anxiety.  He has no specific complaints today.  He gave notice 60 days ago, to retire 5/1.  He has been [employer] position, working 10 days/month ($5K/d, full benefits), hasn't seen the documents yet. Will consider this if truly can be off the grid the other days of the month.  His job has been his main source of anxiety, and would like to work on being less stressed and healthier.  He is planning to go with his wife to [facility] 5/6 for a week--bike rides, hikes, no alcohol.  Wellness retreat. Plans to do Chi-gong (form of tai chi).  Anxiety: Given the frequent need of alprazolam, related to job stress, we had tried him on Lexapro in the Fall. He did not tolerate lexapro (diarrhea, nausea, worsened reflux symptoms, decreased appetite, jitteriness). He took it for 2 weeks. He didn't have problems on the 1/2 tablet. Didn't re-try at 1/2 dose, as he misplaced the bottle. His current alprazolam use includes taking 1/2 most mornings and another 1/2 tablet sometimes later in the day.  Usually lasts 45 days per #30.  Sometimes he needs to take it on the weekends, if working. Takes it prior to public speaking--reports getting performance anxiety. Denies sedation, and it is very effective.  Current exercise routine--4 days/week elliptical x 30 minutes, occasional upper body weights. [place]  Hypertension: He reports compliance with his medication regimen of Atenolol '25mg'$  and lisinopril.  BP's have been running: high in the mornings, low in the evenings.  He isn't sure if his monitor is working right (previously documented as normal in 08/2016).   He has had some very low readings, have been all over the place.  Yesterday's values: 97/60 last night, 105/65 a few hours prior (5pm last night). 156/97  yesterday morning.  Denies headaches (rare sinus headache related to the pollen only), dizziness, side effects of medication. Follows low sodium diet.  Hyperlipidemia follow-up: Atorvastatin dose was increased to '40mg'$  in September. He is tolerating this without side effects.. Patient is reportedly following a low-fat, low cholesterol diet. Compliant with medications and denies medication side effects. Diet remains about the same (eating a lot of fish [place] coast, mostly chicken and fish when home, doesn't eat fried foods. Red meat about once a week or less. Eggs on the weekends (2/weekend)). Lab Results  Component Value Date   CHOL 206 (H) 10/13/2016   HDL 58 10/13/2016   LDLCALC 109 (H) 10/13/2016   TRIG 196 (H) 10/13/2016   CHOLHDL 3.6 10/13/2016   Gout--denies any flares. Had an episode last month with left knee swelling and pain.  Resolved with ibuprofen. Prior gout was foot/ankle/toes, never in the knee. This was not nearly as painful as gout flares.  GERD--if he misses prilosec for a couple of days in a row, he gets recurrent symptoms (GI rumbles, pressure in the chest, feels like food isn't going all the way down).   He usually takes it 5pm, but sometimes forgets.  He is in the process of moving--down sizing to a new build home in [place].  Has had some changes in diet during the move.   PMH, PSH, SH reviewed  Outpatient Encounter Prescriptions as of 02/11/2017  Medication Sig Note  . allopurinol (ZYLOPRIM) 100 MG tablet TAKE  1 TABLET (100 MG TOTAL) BY MOUTH DAILY.   Marland Kitchen ALPRAZolam (XANAX) 0.5 MG tablet Take 1 tablet (0.5 mg total) by mouth daily as needed for anxiety. 02/11/2017: Takes 1/2 most mornings, sometimes requires a second 1/2.  Marland Kitchen atenolol (TENORMIN) 25 MG tablet Take 1 tablet (25 mg total) by mouth daily.   Marland Kitchen atorvastatin (LIPITOR) 40 MG tablet TAKE ONE TABLET BY MOUTH DAILY   . lisinopril (PRINIVIL,ZESTRIL) 20 MG tablet Take 1 tablet (20 mg total) by  mouth daily.   Marland Kitchen omeprazole (PRILOSEC) 40 MG capsule TAKE ONE CAPSULE BY MOUTH DAILY   . [DISCONTINUED] allopurinol (ZYLOPRIM) 100 MG tablet TAKE 1 TABLET (100 MG TOTAL) BY MOUTH DAILY.   . [DISCONTINUED] lisinopril (PRINIVIL,ZESTRIL) 20 MG tablet Take 1 tablet (20 mg total) by mouth daily.   . [DISCONTINUED] benzonatate (TESSALON) 200 MG capsule Take 1 capsule (200 mg total) by mouth 3 (three) times daily as needed.   . [DISCONTINUED] escitalopram (LEXAPRO) 10 MG tablet Start 1/2 tablet by mouth once daily every morning. Increase to full tablet after a week if tolerated (Patient not taking: Reported on 08/27/2016) 08/27/2016: Took x 2 weeks, not recently   No facility-administered encounter medications on file as of 02/11/2017.    No Known Allergies  ROS:  No fever, chills, URI symptoms, dizziness, chest pain, shortness of breath.  GI per above, denies bowel changes or abdominal pain.  No bleeding, bruising, rash.  Joint pain per HPI (recent L knee pain, resolved).  Moods are good.  +stress/anxiety per HPI.   PHYSICAL EXAM:  BP (!) 140/100 (BP Location: Left Arm, Patient Position: Sitting, Cuff Size: Normal)   Pulse 68   Ht 5\' 10"  (1.778 m)   Wt 187 lb (84.8 kg)   BMI 26.83 kg/m   Pt's monitor (left wrist):  184/100 Repeat by MD L wrist 140/80  Well appearing, pleasant, cooperative male, in good spirits, optimistic about retiring, wellness retreat, downsizing home. HEENT: conjunctiva and sclera are clear. OP clear Neck: No lymphadenopathy, thyromegaly or carotid bruit Heart: regular rate and rhythm, no murmur Lungs: clear bilaterally Abdomen: soft, nontender, no organomegaly or mass Extremities: no edema, normal pulses Psych: normal mood, affect, hygiene and grooming Neuro: alert and oriented, cranial nerves intact, normal strength, gait   ASSESSMENT/PLAN:  Essential hypertension, benign - monitor not accurate; BP borderline. Continue current regimen, monitor (get new one)  especially after retiring - Plan: lisinopril (PRINIVIL,ZESTRIL) 20 MG tablet, Comprehensive metabolic panel  Anxiety state - hope to see improvement after retiring, and cut back significantly on alprazolam use. Continue prn use for now.  Pure hypercholesterolemia - reviewed proper diet.  Continue current meds; recheck at his physical in June - Plan: Lipid panel  Gout of right foot, unspecified cause, unspecified chronicity - continue allopurinol. check uric acid with labs at CPE; increase dose if elevated, or flares - Plan: allopurinol (ZYLOPRIM) 100 MG tablet, Uric acid  Impaired fasting glucose - Plan: Hemoglobin A1c, Comprehensive metabolic panel  Annual physical exam - scheduled for June--labs to be done prior to visit - Plan: Hemoglobin A1c, CBC with Differential/Platelet, Lipid panel, Comprehensive metabolic panel, Uric acid  Medication monitoring encounter - Plan: CBC with Differential/Platelet, Lipid panel, Comprehensive metabolic panel, Uric acid     CPE scheduled for 03/2017; to get fasting labs prior c-met lipids, uric acid, A1c, CBC  PSA's monitored by Dr. 04/2017; states that last report was excellent, "cure"  Refill allopurinol and lisinopril Will be switching pharmacies after moving,so given #90  of current prescriptions, will send additional refills to new pharmacy at his physical.

## 2017-02-11 ENCOUNTER — Ambulatory Visit (INDEPENDENT_AMBULATORY_CARE_PROVIDER_SITE_OTHER): Payer: 59 | Admitting: Family Medicine

## 2017-02-11 ENCOUNTER — Encounter: Payer: Self-pay | Admitting: Family Medicine

## 2017-02-11 VITALS — BP 140/80 | HR 68 | Ht 70.0 in | Wt 187.0 lb

## 2017-02-11 DIAGNOSIS — I10 Essential (primary) hypertension: Secondary | ICD-10-CM

## 2017-02-11 DIAGNOSIS — R7301 Impaired fasting glucose: Secondary | ICD-10-CM | POA: Diagnosis not present

## 2017-02-11 DIAGNOSIS — Z0000 Encounter for general adult medical examination without abnormal findings: Secondary | ICD-10-CM | POA: Diagnosis not present

## 2017-02-11 DIAGNOSIS — E7800 Pure hypercholesterolemia, unspecified: Secondary | ICD-10-CM | POA: Diagnosis not present

## 2017-02-11 DIAGNOSIS — M109 Gout, unspecified: Secondary | ICD-10-CM | POA: Diagnosis not present

## 2017-02-11 DIAGNOSIS — F411 Generalized anxiety disorder: Secondary | ICD-10-CM | POA: Diagnosis not present

## 2017-02-11 DIAGNOSIS — Z5181 Encounter for therapeutic drug level monitoring: Secondary | ICD-10-CM | POA: Diagnosis not present

## 2017-02-11 MED ORDER — LISINOPRIL 20 MG PO TABS: 20.0000 mg | ORAL_TABLET | Freq: Every day | ORAL | 0 refills | Status: DC

## 2017-02-11 MED ORDER — ALLOPURINOL 100 MG PO TABS: ORAL_TABLET | ORAL | 0 refills | Status: DC

## 2017-02-11 NOTE — Patient Instructions (Signed)
Continue your current medications. Your blood pressure monitor was not accurate. Consider getting an arm one vs the wrist kind.  Omron and Reli-on are good brands. Keep a list, with columns for date, morning, evening and "comments" and bring this to your physical or feel free to email with any questions/concerns.  I recommend getting the new shingles vaccine (Shingrix). You will need to check with your insurance to see if it is covered.  It is a series of 2 injections, spaced 2 months apart.

## 2017-02-25 ENCOUNTER — Other Ambulatory Visit: Payer: Self-pay | Admitting: Family Medicine

## 2017-02-25 MED ORDER — ALPRAZOLAM 0.5 MG PO TABS: 0.5000 mg | ORAL_TABLET | Freq: Every day | ORAL | 0 refills | Status: DC | PRN

## 2017-03-26 ENCOUNTER — Other Ambulatory Visit: Payer: 59

## 2017-03-26 DIAGNOSIS — Z0000 Encounter for general adult medical examination without abnormal findings: Secondary | ICD-10-CM

## 2017-03-26 DIAGNOSIS — Z5181 Encounter for therapeutic drug level monitoring: Secondary | ICD-10-CM

## 2017-03-26 DIAGNOSIS — M109 Gout, unspecified: Secondary | ICD-10-CM

## 2017-03-26 DIAGNOSIS — R7301 Impaired fasting glucose: Secondary | ICD-10-CM

## 2017-03-26 DIAGNOSIS — E7800 Pure hypercholesterolemia, unspecified: Secondary | ICD-10-CM

## 2017-03-26 DIAGNOSIS — I10 Essential (primary) hypertension: Secondary | ICD-10-CM

## 2017-03-26 LAB — CBC WITH DIFFERENTIAL/PLATELET
Basophils Absolute: 62 cells/uL (ref 0–200)
Basophils Relative: 1 %
EOS PCT: 1 %
Eosinophils Absolute: 62 cells/uL (ref 15–500)
HCT: 44.2 % (ref 38.5–50.0)
Hemoglobin: 14.8 g/dL (ref 13.2–17.1)
LYMPHS PCT: 30 %
Lymphs Abs: 1860 cells/uL (ref 850–3900)
MCH: 31.6 pg (ref 27.0–33.0)
MCHC: 33.5 g/dL (ref 32.0–36.0)
MCV: 94.2 fL (ref 80.0–100.0)
MPV: 10.9 fL (ref 7.5–12.5)
Monocytes Absolute: 744 cells/uL (ref 200–950)
Monocytes Relative: 12 %
NEUTROS PCT: 56 %
Neutro Abs: 3472 cells/uL (ref 1500–7800)
Platelets: 262 10*3/uL (ref 140–400)
RBC: 4.69 MIL/uL (ref 4.20–5.80)
RDW: 13.7 % (ref 11.0–15.0)
WBC: 6.2 10*3/uL (ref 4.0–10.5)

## 2017-03-26 LAB — LIPID PANEL
CHOLESTEROL: 202 mg/dL — AB (ref <200)
HDL: 62 mg/dL (ref >40)
LDL CALC: 118 mg/dL — AB (ref <100)
TRIGLYCERIDES: 109 mg/dL (ref <150)
Total CHOL/HDL Ratio: 3.3 Ratio (ref <5.0)
VLDL: 22 mg/dL (ref <30)

## 2017-03-26 LAB — COMPREHENSIVE METABOLIC PANEL
ALK PHOS: 60 U/L (ref 40–115)
ALT: 37 U/L (ref 9–46)
AST: 24 U/L (ref 10–35)
Albumin: 4.2 g/dL (ref 3.6–5.1)
BILIRUBIN TOTAL: 0.6 mg/dL (ref 0.2–1.2)
BUN: 16 mg/dL (ref 7–25)
CALCIUM: 9.2 mg/dL (ref 8.6–10.3)
CO2: 25 mmol/L (ref 20–31)
Chloride: 105 mmol/L (ref 98–110)
Creat: 1.06 mg/dL (ref 0.70–1.25)
Glucose, Bld: 96 mg/dL (ref 65–99)
POTASSIUM: 4.2 mmol/L (ref 3.5–5.3)
Sodium: 141 mmol/L (ref 135–146)
TOTAL PROTEIN: 6.6 g/dL (ref 6.1–8.1)

## 2017-03-26 LAB — URIC ACID: URIC ACID, SERUM: 6.1 mg/dL (ref 4.0–8.0)

## 2017-03-27 LAB — HEMOGLOBIN A1C
HEMOGLOBIN A1C: 5.5 % (ref <5.7)
MEAN PLASMA GLUCOSE: 111 mg/dL

## 2017-03-29 NOTE — Progress Notes (Signed)
Chief Complaint  Patient presents with  . Annual Exam    nonfasting annual exam, lans already done. Gets eye checked at eye doctor. Brought in bp list today. No concerns.     Jeremy Bush is a 61 y.o. male who presents for a complete physical.  He had labs done prior to his visit, see below.  He and his wife went to [facility] in [place] in early May for a week--bike rides, hikes, no alcohol, Chi-gong (form of tai chi).  Wellness retreat. It was all clean eating, and he felt great.  He reports he lost 8# during that week, has since regained some back. 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  He is working through 6/15th. He is looking forward to daily bike rides, fishing/sailing.  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  Anxiety: Previously, given the frequent need of alprazolam, related to job stress, we had tried him on Lexapro. He did not tolerate lexapro (diarrhea, nausea, worsened reflux symptoms, decreased appetite, jitteriness). He took it for 2 weeks in the Fall. He didn't have problems on the 1/2 tablet. Didn't re-try at 1/2 dose, as he misplaced the bottle. His current alprazolam use includes taking 1/2 most mornings and another 1/2 tablet sometimes later in the day.  Denies sedation, and it is very effective. Last refilled 02/25/17, #30 Still has 1/2 bottle left.  Hypertension: He reports compliance with his medication regimen of Atenolol 62m and lisinopril.  He brings in a new Omron BP monitor (arm, prior Omron was wrist monitor) to have accuracy verified.  He also brings in list of BP's.  They have been running 107/71 up to 155/95.  Most of the higher values (1572-620systolic) are related to work or first thing in the morning after coffee. They are good when relaxing, after exercise, drinking wine. Pulse 61-84. Denies headaches, dizziness, side effects of  medication. Follows low sodium diet.  Hyperlipidemia follow-up: Atorvastatin dose was increased to 475min September. He is tolerating this without side effects.. Patient is reportedly following a low-fat, low cholesterol diet. Compliant with medications and denies medication side effects.  Gout--denies any flares. Prior gout was foot/ankle/toes.  Had some knee pain prior to his last visit here, relieved by ibuprofen (not nearly as painful as gout).  No ongoing joint pains.  GERD--He realizes he has been missing some pills, and seems to be doing fine. Symptoms now are more stomach grumbling, no longer the pressure or dysphagia. [facility].  Drinking more since home--some celebrations (wedding). He notes his BP's are higher after drinking more (the next morning) and admits that his sleep isn't as good.  He used to have recurrent reflux symptoms with a few missed doses, no longer.  Prostate cancer--under care of Dr. OtKarsten RoS/p radioactive seed implant. Last PSA was good, in January.  Immunization History  Administered Date(s) Administered  . Influenza Split 07/22/2011, 07/29/2012  . Influenza,inj,Quad PF,36+ Mos 07/28/2013, 08/02/2014, 11/01/2015, 06/25/2016  . Td 09/15/2005  . Tdap 01/13/2013  . Zoster 08/02/2014   Last colonoscopy: 04/2012 Dr. ScMichail Sermonpolyps), q5 years  Last PSA: UTD per urologist--0.07 in  10/2016  Dentist: 4 times yearly (for cleanings) Ophtho: yearly Exercise: 4 days/week elliptical x 30 minutes, occasional upper body weights. Bike rides a lot when on [employer]  Plans to bike 2 hours/day after retiring. He has been working much less recently. Today he took a 1 hour walk, 1 hour bike ride, and 30 mins elliptical.  Past Medical History:  Diagnosis Date  . Anxiety    related to work stress  . Colonic polyp 2007, 2013  . Elevated uric acid in blood    with possible gout flare x 2  .  Hyperlipidemia   . Hypertension   . Nocturia   . Prostate cancer ([place]) 03/24/12   gleason 6, vol 31.3 cc    Past Surgical History:  Procedure Laterality Date  . BIOPSY PROSTATE  03/24/12   gleason 3+3=6/prostate volume 31 cc's  (md office)  . CYSTOSCOPY  08/13/2012   Procedure: CYSTOSCOPY FLEXIBLE;  Surgeon: Claybon Jabs, MD;  Location: [facility];  Service: Urology;  Laterality: N/A;  no seeds found in bladder  . RADIOACTIVE SEED IMPLANT  08/13/2012   Procedure: RADIOACTIVE SEED IMPLANT;  Surgeon: Claybon Jabs, MD;  Location: [facility];  Service: Urology;  Laterality: N/A;   79    seeds implanted    Social History   Social History  . Marital status: Married    Spouse name: N/A  . Number of children: N/A  . Years of education: N/A   Occupational History  . Not on file.   Social History Main Topics  . Smoking status: Never Smoker  . Smokeless tobacco: Never Used  . Alcohol use 8.4 oz/week    14 Glasses of wine per week     Comment: 2-3 drinks daily, after 5pm.  . Drug use: No  . Sexual activity: Yes    Partners: Female   Other Topics Concern  . Not on file   Social History Narrative   Lives with wife, 1 dog.  2 children and 1 stepchild.  Daughters in [place], stepson in [place]. 2 granddaughter    Family History  Problem Relation Age of Onset  . Cancer Mother        lung (smoker)  . Hypertension Mother   . Hyperlipidemia Mother   . Stroke Father        mini-stroke  . Hypertension Father   . Kidney disease Father        on dialysis  . Diabetes Father   . Healthy Sister     Outpatient Encounter Prescriptions as of 03/30/2017  Medication Sig Note  . allopurinol (ZYLOPRIM) 100 MG tablet TAKE 1 TABLET (100 MG TOTAL) BY MOUTH DAILY.   Marland Kitchen ALPRAZolam (XANAX) 0.5 MG tablet Take 1 tablet (0.5 mg total) by mouth daily as needed for anxiety.   Marland Kitchen atenolol (TENORMIN) 25 MG tablet Take 1 tablet (25 mg total) by mouth daily.   Marland Kitchen atorvastatin  (LIPITOR) 40 MG tablet TAKE ONE TABLET BY MOUTH DAILY   . lisinopril (PRINIVIL,ZESTRIL) 20 MG tablet Take 1 tablet (20 mg total) by mouth daily.   Marland Kitchen omeprazole (PRILOSEC) 40 MG capsule TAKE ONE CAPSULE BY MOUTH DAILY 03/30/2017: Missing doses periodically (and no recurrent symptoms)   No facility-administered encounter medications on file as of 03/30/2017.     No Known Allergies  ROS: The patient denies anorexia, fever, headaches, vision loss, decreased hearing, ear pain, hoarseness, chest pain, palpitations, dizziness, syncope, dyspnea on exertion, cough, swelling, nausea, vomiting, diarrhea, constipation, abdominal pain, melena, hematochezia, hematuria, incontinence, erectile dysfunction, nocturia, weakened urine stream, dysuria, genital lesions, joint pains, numbness, tingling, weakness, tremor, suspicious skin lesions, depression, abnormal bleeding/bruising, or  enlarged lymph nodes.  Anxiety per HPI +weight loss (during healthy eating and no alcohol while at [facility].)   PHYSICAL EXAM:  BP (!) 152/98 (BP Location: Left Arm, Patient Position: Sitting, Cuff Size: Normal)   Pulse 68   Ht 5' 9.25" (1.759 m)   Wt 181 lb 12.8 oz (82.5 kg)   BMI 26.65 kg/m   Wt Readings from Last 3 Encounters:  03/30/17 181 lb 12.8 oz (82.5 kg)  02/11/17 187 lb (84.8 kg)  08/27/16 186 lb (84.4 kg)   New monitor 150/91 (nurse 152/98) 152/84 on repeat by MD 142/82 on repeat with his monitor   General Appearance:  Alert, cooperative, no distress, appears stated age   Head:  Normocephalic, without obvious abnormality, atraumatic   Eyes:  PERRL, conjunctiva/corneas clear, EOM's intact, fundi benign   Ears:  Normal TM's and external ear canals   Nose:  Nares normal, mucosa normal, no drainage or sinus tenderness   Throat:  Lips, mucosa, and tongue normal; teeth and gums normal   Neck:  Supple, no lymphadenopathy; thyroid: no enlargement/tenderness/nodules; no carotid bruit  or JVD   Back:  Spine nontender, no curvature, ROM normal, no CVA tenderness   Lungs:  Clear to auscultation bilaterally without wheezes, rales or ronchi; respirations unlabored   Chest Wall:  No tenderness or deformity   Heart:  Regular rate and rhythm, S1 and S2 normal, no murmur, rub or gallop   Breast Exam:  No chest wall tenderness, masses or gynecomastia   Abdomen:  Soft, non-tender, nondistended, normoactive bowel sounds, no masses, no hepatosplenomegaly   Genitalia:  Deferred to urologist   Rectal:  Deferred to urologist   Extremities:  No clubbing, cyanosis or edema   Pulses:  2+ and symmetric all extremities   Skin:  Skin color, texture, turgor normal. (Pt declined getting into gown for skin exam)   Lymph nodes:  Cervical, supraclavicular, and axillary nodes normal   Neurologic:  CNII-XII intact, normal strength, sensation and gait; reflexes 2+ and symmetric throughout              Psych:  Normal mood, affect, hygiene and grooming.     Chemistry      Component Value Date/Time   NA 141 03/26/2017 0740   K 4.2 03/26/2017 0740   CL 105 03/26/2017 0740   CO2 25 03/26/2017 0740   BUN 16 03/26/2017 0740   CREATININE 1.06 03/26/2017 0740      Component Value Date/Time   CALCIUM 9.2 03/26/2017 0740   ALKPHOS 60 03/26/2017 0740   AST 24 03/26/2017 0740   ALT 37 03/26/2017 0740   BILITOT 0.6 03/26/2017 0740     Fasting glucose 96  Lab Results  Component Value Date   HGBA1C 5.5 03/26/2017   Lab Results  Component Value Date   CHOL 202 (H) 03/26/2017   HDL 62 03/26/2017   LDLCALC 118 (H) 03/26/2017   TRIG 109 03/26/2017   CHOLHDL 3.3 03/26/2017   Lab Results  Component Value Date   WBC 6.2 03/26/2017   HGB 14.8 03/26/2017   HCT 44.2 03/26/2017   MCV 94.2 03/26/2017   PLT 262 03/26/2017   Lab Results  Component Value Date   LABURIC 6.1 03/26/2017    ASSESSMENT/PLAN:  Annual physical exam - Plan: POCT Urinalysis Dipstick  Pure  hypercholesterolemia - continue atorvastatin at current dose, and lowfat, low cholesterol diet  Essential hypertension, benign - many normal values, some borderline/high ones with work/stress.  Expect to see improvements after retiring. Monitor accurate; continue to record, fu if high  Impaired fasting glucose - resolved (last A1c elevation 2 yrs ago)--continue regular exercise, proper diet  Gout of right foot, unspecified cause, unspecified chronicity - no flares; continue low dose allopurinol  Anxiety state - continue prn use of alprazolam, should decrease use after retired  [facility]) - s/p radioactive seed implant; last PSA was great.  f/u with Dr. Karsten Ro as scheduled  Gastroesophageal reflux disease without esophagitis - doing well--improved since cutting back on alcohol usage. Discussed prn use of PPI, if possible, rather than daily    Shingrix--will return for [place], prefers not to get today due to upcoming sailing trip.  He is willing to pay full price if not covered.   Recommended at least 30 minutes of aerobic activity at least 5 days/week, weight-bearing exercise at least 2x/week; proper sunscreen use reviewed; healthy diet and alcohol recommendations (less than or equal to 2 drinks/day) reviewed; regular seatbelt use; changing batteries in smoke detectors. Immunization recommendations discussed--yearly flu shots, Shingrix recommended. Pneumonia vaccine at age 55. Colonoscopy recommendations reviewed--due 04/2017. He will call if doesn't receive notice.  F/u 1 year, sooner for Shingrix vaccines x 2, and/or if home BPs abnormal.

## 2017-03-30 ENCOUNTER — Telehealth: Payer: Self-pay | Admitting: Family Medicine

## 2017-03-30 ENCOUNTER — Ambulatory Visit (INDEPENDENT_AMBULATORY_CARE_PROVIDER_SITE_OTHER): Payer: 59 | Admitting: Family Medicine

## 2017-03-30 ENCOUNTER — Encounter: Payer: Self-pay | Admitting: Family Medicine

## 2017-03-30 VITALS — BP 152/98 | HR 68 | Ht 69.25 in | Wt 181.8 lb

## 2017-03-30 DIAGNOSIS — E7800 Pure hypercholesterolemia, unspecified: Secondary | ICD-10-CM

## 2017-03-30 DIAGNOSIS — C61 Malignant neoplasm of prostate: Secondary | ICD-10-CM

## 2017-03-30 DIAGNOSIS — M109 Gout, unspecified: Secondary | ICD-10-CM

## 2017-03-30 DIAGNOSIS — F411 Generalized anxiety disorder: Secondary | ICD-10-CM

## 2017-03-30 DIAGNOSIS — R7301 Impaired fasting glucose: Secondary | ICD-10-CM

## 2017-03-30 DIAGNOSIS — I10 Essential (primary) hypertension: Secondary | ICD-10-CM

## 2017-03-30 DIAGNOSIS — K219 Gastro-esophageal reflux disease without esophagitis: Secondary | ICD-10-CM

## 2017-03-30 DIAGNOSIS — Z0000 Encounter for general adult medical examination without abnormal findings: Secondary | ICD-10-CM

## 2017-03-30 DIAGNOSIS — Z5181 Encounter for therapeutic drug level monitoring: Secondary | ICD-10-CM

## 2017-03-30 LAB — POCT URINALYSIS DIPSTICK
Bilirubin, UA: NEGATIVE
Blood, UA: NEGATIVE
Glucose, UA: NEGATIVE
Ketones, UA: NEGATIVE
LEUKOCYTES UA: NEGATIVE
NITRITE UA: NEGATIVE
PH UA: 5.5 (ref 5.0–8.0)
PROTEIN UA: NEGATIVE
Spec Grav, UA: >=1.03 — AB (ref 1.010–1.025)
Urobilinogen, UA: NEGATIVE E.U./dL — AB

## 2017-03-30 NOTE — Telephone Encounter (Signed)
Patient scheduled his cpe for 03/31/18 and would like to have labs prior to cpe

## 2017-03-30 NOTE — Telephone Encounter (Signed)
Orders entered

## 2017-03-30 NOTE — Patient Instructions (Signed)

## 2017-04-14 ENCOUNTER — Other Ambulatory Visit: Payer: Self-pay

## 2017-04-14 ENCOUNTER — Encounter: Payer: Self-pay | Admitting: Family Medicine

## 2017-04-14 DIAGNOSIS — E7800 Pure hypercholesterolemia, unspecified: Secondary | ICD-10-CM

## 2017-04-14 MED ORDER — ALPRAZOLAM 0.5 MG PO TABS: 0.5000 mg | ORAL_TABLET | Freq: Every day | ORAL | 0 refills | Status: DC | PRN

## 2017-04-14 MED ORDER — ATORVASTATIN CALCIUM 40 MG PO TABS: 40.0000 mg | ORAL_TABLET | Freq: Every day | ORAL | 5 refills | Status: DC

## 2017-04-14 NOTE — Telephone Encounter (Signed)
Called in xanax to pharmacy

## 2017-04-14 NOTE — Telephone Encounter (Signed)
I have called to discontinue meds at [facility] Had to send meds to [facility] not [facility]

## 2017-04-14 NOTE — Telephone Encounter (Signed)
Please call in the alprazolam refill as pended.  I did the atorvastatin refill already.

## 2017-05-07 ENCOUNTER — Other Ambulatory Visit: Payer: Self-pay | Admitting: Family Medicine

## 2017-05-07 DIAGNOSIS — M109 Gout, unspecified: Secondary | ICD-10-CM

## 2017-05-07 MED ORDER — ALLOPURINOL 100 MG PO TABS: ORAL_TABLET | ORAL | 0 refills | Status: DC

## 2017-05-14 ENCOUNTER — Other Ambulatory Visit: Payer: Self-pay | Admitting: Family Medicine

## 2017-05-14 DIAGNOSIS — I10 Essential (primary) hypertension: Secondary | ICD-10-CM

## 2017-05-14 MED ORDER — LISINOPRIL 20 MG PO TABS: 20.0000 mg | ORAL_TABLET | Freq: Every day | ORAL | 1 refills | Status: DC

## 2017-05-29 ENCOUNTER — Other Ambulatory Visit: Payer: Self-pay | Admitting: Family Medicine

## 2017-05-29 DIAGNOSIS — K219 Gastro-esophageal reflux disease without esophagitis: Secondary | ICD-10-CM

## 2017-05-29 MED ORDER — ALPRAZOLAM 0.5 MG PO TABS: 0.5000 mg | ORAL_TABLET | Freq: Every day | ORAL | 0 refills | Status: DC | PRN

## 2017-05-29 MED ORDER — OMEPRAZOLE 40 MG PO CPDR: 40.0000 mg | DELAYED_RELEASE_CAPSULE | Freq: Every day | ORAL | 0 refills | Status: DC

## 2017-05-29 NOTE — Telephone Encounter (Signed)
Called into [facility] 

## 2017-05-29 NOTE — Telephone Encounter (Signed)
See Dr. Johnsie Kindred message

## 2017-05-29 NOTE — Telephone Encounter (Signed)
Can this pt have a refill on this ? 

## 2017-05-29 NOTE — Telephone Encounter (Signed)
Okay to call in alprazolam as pended, and send in refill of omeprazole as also requested

## 2017-05-29 NOTE — Telephone Encounter (Signed)
Please see the message below.

## 2017-07-19 ENCOUNTER — Other Ambulatory Visit: Payer: Self-pay | Admitting: Family Medicine

## 2017-07-19 DIAGNOSIS — F418 Other specified anxiety disorders: Secondary | ICD-10-CM

## 2017-07-19 DIAGNOSIS — I10 Essential (primary) hypertension: Secondary | ICD-10-CM

## 2017-07-20 ENCOUNTER — Encounter: Payer: 59 | Admitting: Family Medicine

## 2017-07-20 NOTE — Telephone Encounter (Signed)
Call patient and he states that he is still just using 1/2 daily. He said this will probably be the last refill, he will just use for flying. Dr.Knapp said this will be ok to refill again.

## 2017-07-20 NOTE — Telephone Encounter (Signed)
Is this okay to refill? 

## 2017-07-20 NOTE — Telephone Encounter (Signed)
Using #30 about every 6 weeks (last filled 8/3, 6/18, 5/2). Pattern really hasn't changed since he was supposed to stop working on 6/15.  He was advised of the following info with his last refil request on 8/3 "I was expecting you to not really need as much alprazolam, since you left your job. 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  Please check in with patient and see what is going on.  I truly don't want him continuing to take this on a daily basis (per last visit, taking 1/2 tablet daily, sometimes taking 2nd half later in day)  Okay to refill the atenolol until June's appt--though may need sooner one to discuss his anxiety, if appropriate.

## 2017-08-17 ENCOUNTER — Other Ambulatory Visit: Payer: Self-pay | Admitting: Family Medicine

## 2017-08-17 DIAGNOSIS — M109 Gout, unspecified: Secondary | ICD-10-CM

## 2017-08-17 MED ORDER — ALLOPURINOL 100 MG PO TABS: ORAL_TABLET | ORAL | 0 refills | Status: DC

## 2017-09-15 ENCOUNTER — Other Ambulatory Visit (INDEPENDENT_AMBULATORY_CARE_PROVIDER_SITE_OTHER): Payer: 59

## 2017-09-15 DIAGNOSIS — Z23 Encounter for immunization: Secondary | ICD-10-CM

## 2017-09-24 ENCOUNTER — Encounter: Payer: 59 | Admitting: Family Medicine

## 2017-10-13 ENCOUNTER — Other Ambulatory Visit: Payer: Self-pay | Admitting: Family Medicine

## 2017-10-13 NOTE — Telephone Encounter (Signed)
Is this ok to refill?  

## 2017-10-13 NOTE — Telephone Encounter (Signed)
Ok

## 2017-10-15 ENCOUNTER — Other Ambulatory Visit: Payer: Self-pay | Admitting: Family Medicine

## 2017-10-15 MED ORDER — ALPRAZOLAM 0.5 MG PO TABS: 0.5000 mg | ORAL_TABLET | Freq: Every day | ORAL | 0 refills | Status: DC | PRN

## 2017-10-15 NOTE — Telephone Encounter (Signed)
Is this okay to refill? 

## 2017-10-21 ENCOUNTER — Encounter: Payer: Self-pay | Admitting: Family Medicine

## 2017-11-01 NOTE — Progress Notes (Signed)
Chief Complaint  Patient presents with  . Hypertension    6 month follow up. In Dec started taking OTC omeprazole and has not taken that for 8-9 days.   Anxiety: Overall is much better, since no longer working at prior job. Previously, given the frequent need of alprazolam, related to job stress, we had tried him on Lexapro. He did not tolerate lexapro (diarrhea, nausea, worsened reflux symptoms, decreased appetite, jitteriness--he took it for 2 weeks only, didn't have problems on the 1/2 tablet. Didn't re-try at 1/2 dose, as he misplaced the bottle).  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 Denies sedation, and it is very effective. Last refilled 12/20, #30.  Prior refills were 07/20/17, 05/29/17 and 04/13/17.  Hypertension: He reports compliance with his medication regimen of Atenolol 25mg  andlisinopril.  He takes them both first thing in the morning. His Omron BP monitor (arm) was verified as accurate in 03/2017, and re- verified today (see below). BP's have been running higher in the morning, much lower later in the day, especially after exercise.  Readings from monitor: Frst thing in the morning, just out of bed:  151/84 (today), 162/96, 156/86, 146/85 (a little later in the morning, after breakfast), 150/92, 161/93, 146/90. After exercise (30 mins elliptical, or more; after showering, about 30 mins later): 128/82 (this morning), 115/71 (30 min elliptical +wts), 111/68 same day, after an hour walk, 116/76,, remained low 2 hours after lunch the same day. 110/70 (1 hr elliptical +wts), 132/87 the day prior, same exercise. 131/88 (on 1/1, after 30 min elliptical).   Latest value checked was 6pm, 119/71, once before bedtime 141/87, 131/88  He has been exercising every day for the last 3 weeks (spin classes on weekends, more regular during the week, plus frequent walks in neighborhood). He is more committed now to daily exercise.  Denies headaches,  dizziness, side effects of medication.  Follows low sodium diet.  GERD--He finished the rx prilosec in December, and has been only using OTC version daily, sometimes skipping days. He stopped taking Prilosec OTC 9 days ago, since his diet improved, cut back on alcohol, and exercising more. Only slight gurgling on occasion, no other discomfort.  Still sleeps with HOB elevated.  Hyperlipidemia follow-up:  Patient is reportedly following a low-fat, low cholesterol diet. Compliant with medications (40mg  atorvastatin) and denies medication side effects. Lab Results  Component Value Date   CHOL 202 (H) 03/26/2017   HDL 62 03/26/2017   LDLCALC 118 (H) 03/26/2017   TRIG 109 03/26/2017   CHOLHDL 3.3 03/26/2017    Gout--denies any flares. Prior gout was foot/ankle/toes.  Lab Results  Component Value Date   LABURIC 6.1 03/26/2017   Prostate cancer--under care of Dr. Karsten Ro. S/p radioactive seed implant. Due for f/u in March.  PMH, PSH, SH reviewed  Outpatient Encounter Medications as of 11/02/2017  Medication Sig  . allopurinol (ZYLOPRIM) 100 MG tablet TAKE 1 TABLET (100 MG TOTAL) BY MOUTH DAILY.  Marland Kitchen ALPRAZolam (XANAX) 0.5 MG tablet Take 1 tablet (0.5 mg total) by mouth daily as needed. for anxiety  . atenolol (TENORMIN) 25 MG tablet TAKE ONE TABLET BY MOUTH DAILY  . atorvastatin (LIPITOR) 40 MG tablet Take 1 tablet (40 mg total) by mouth daily.  Marland Kitchen lisinopril (PRINIVIL,ZESTRIL) 20 MG tablet Take 1 tablet (20 mg total) by mouth daily.  . [DISCONTINUED] allopurinol (ZYLOPRIM) 100 MG tablet TAKE 1 TABLET (100 MG TOTAL) BY MOUTH DAILY.  . [DISCONTINUED] atorvastatin (LIPITOR)  40 MG tablet Take 1 tablet (40 mg total) by mouth daily.  . [DISCONTINUED] lisinopril (PRINIVIL,ZESTRIL) 20 MG tablet Take 1 tablet (20 mg total) by mouth daily.  Marland Kitchen omeprazole (PRILOSEC) 40 MG capsule Take 1 capsule (40 mg total) by mouth daily. (Patient not taking: Reported on 11/02/2017)   No facility-administered encounter  medications on file as of 11/02/2017.    No Known Allergies  ROS:  No fever, chills, headaches, dizziness, chest pain, shortness of breath, nausea, vomiting, bowel changes, abdominal pain, urinary complaints, bleeding, bruising, rash, depression, or other complaints. Some snoring, not aware of apnea.  Feels refreshed when he wakes up, no morning headaches or daytime somnolence.  PHYSICAL EXAM:  BP (!) 148/90   Pulse 80   Ht 5' 9.25" (1.759 m)   Wt 184 lb 6.4 oz (83.6 kg)   BMI 27.03 kg/m    132/76 on repeat by MD, [place] Pt's monitor: 132/86   Wt Readings from Last 3 Encounters:  11/02/17 184 lb 6.4 oz (83.6 kg)  03/30/17 181 lb 12.8 oz (82.5 kg)  02/11/17 187 lb (84.8 kg)   Well-appearing, pleasant male in no distress HEENT: conjunctiva and sclera are clear, EOMI. OP clear Neck: No lymphadenopathy, thyromegaly or mass, no bruit Heart: regular rate and rhythm, no murmur Lungs: clear bilaterally Back: no spinal or CVA tenderness Abdomen: soft, nontender, no organomegaly or mass Extremities: no edema, normal pulses Psych: normal mood, affect, hygiene and grooming Skin: normal turgor, no rash/lesions Neuro: alert and oriented, cranial nerves intact, normal gait   ASSESSMENT/PLAN:  Essential hypertension, benign - high in mornings, improved later in day; try staggering meds rather than taking both in a.m. Continue low sodium diet, regular exercise and monitoring - Plan: lisinopril (PRINIVIL,ZESTRIL) 20 MG tablet  Gastroesophageal reflux disease without esophagitis  Gout of right foot, unspecified cause, unspecified chronicity - continue allopurinol. check uric acid with labs at CPE; increase dose if elevated, or flares - Plan: allopurinol (ZYLOPRIM) 100 MG tablet  Pure hypercholesterolemia - Plan: atorvastatin (LIPITOR) 40 MG tablet  Essential hypertension, benign - Plan: lisinopril (PRINIVIL,ZESTRIL) 20 MG tablet  Anxiety state - needing alprazolam much less often since change  in job. Continue to limit use/use sparingly   No labs F/u in June for physical, with labs prior (orders already in system).    Try switching one of your blood pressure medications to evening dosing (or dinner time), and continue to take the other in the morning.  This might help bridge the morning time better, so both aren't wearing off at the same time, leading to higher blood pressures in the morning.  If this makes ZERO difference in your morning blood pressures after a month, feel free to switch back to the easier regimen.  Ask your wife whether or not you stop breathing (take long pauses between snoring) at night.  If you do, you should be evaluated with a sleep study, looking for sleep apnea.   Continue your daily exercise, healthy eating, and limiting your alcohol. You may use prilosec OTC or zantac or pepcid as needed, prior to a meal that may trigger reflux symptoms. Resume daily use if you develop more regular reflux symptoms, trouble swallowing, or other concerns (cough, hoarseness, etc). Return if these do not resolve.

## 2017-11-02 ENCOUNTER — Encounter: Payer: Self-pay | Admitting: Family Medicine

## 2017-11-02 ENCOUNTER — Ambulatory Visit: Payer: 59 | Admitting: Family Medicine

## 2017-11-02 VITALS — BP 132/76 | HR 80 | Ht 69.25 in | Wt 184.4 lb

## 2017-11-02 DIAGNOSIS — M109 Gout, unspecified: Secondary | ICD-10-CM

## 2017-11-02 DIAGNOSIS — E7800 Pure hypercholesterolemia, unspecified: Secondary | ICD-10-CM | POA: Diagnosis not present

## 2017-11-02 DIAGNOSIS — K219 Gastro-esophageal reflux disease without esophagitis: Secondary | ICD-10-CM | POA: Diagnosis not present

## 2017-11-02 DIAGNOSIS — F411 Generalized anxiety disorder: Secondary | ICD-10-CM

## 2017-11-02 DIAGNOSIS — I10 Essential (primary) hypertension: Secondary | ICD-10-CM

## 2017-11-02 MED ORDER — ATORVASTATIN CALCIUM 40 MG PO TABS: 40.0000 mg | ORAL_TABLET | Freq: Every day | ORAL | 1 refills | Status: DC

## 2017-11-02 MED ORDER — ALLOPURINOL 100 MG PO TABS: ORAL_TABLET | ORAL | 1 refills | Status: DC

## 2017-11-02 MED ORDER — LISINOPRIL 20 MG PO TABS: 20.0000 mg | ORAL_TABLET | Freq: Every day | ORAL | 1 refills | Status: DC

## 2017-11-02 NOTE — Patient Instructions (Signed)
Try switching one of your blood pressure medications to evening dosing (or dinner time), and continue to take the other in the morning.  This might help bridge the morning time better, so both aren't wearing off at the same time, leading to higher blood pressures in the morning.  If this makes ZERO difference in your morning blood pressures after a month, feel free to switch back to the easier regimen.  Ask your wife whether or not you stop breathing (take long pauses between snoring) at night.  If you do, you should be evaluated with a sleep study, looking for sleep apnea.   Continue your daily exercise, healthy eating, and limiting your alcohol. You may use prilosec OTC or zantac or pepcid as needed, prior to a meal that may trigger reflux symptoms. Resume daily use if you develop more regular reflux symptoms, trouble swallowing, or other concerns (cough, hoarseness, etc). Return if these do not resolve.

## 2017-11-05 LAB — HM COLONOSCOPY

## 2017-11-16 ENCOUNTER — Encounter: Payer: Self-pay | Admitting: *Deleted

## 2017-11-24 ENCOUNTER — Encounter: Payer: Self-pay | Admitting: Family Medicine

## 2017-12-01 ENCOUNTER — Encounter: Payer: Self-pay | Admitting: Family Medicine

## 2017-12-24 ENCOUNTER — Other Ambulatory Visit: Payer: Self-pay | Admitting: *Deleted

## 2017-12-24 ENCOUNTER — Encounter: Payer: Self-pay | Admitting: Family Medicine

## 2017-12-24 DIAGNOSIS — F418 Other specified anxiety disorders: Secondary | ICD-10-CM

## 2017-12-24 DIAGNOSIS — I10 Essential (primary) hypertension: Secondary | ICD-10-CM

## 2017-12-24 MED ORDER — ATENOLOL 25 MG PO TABS: 25.0000 mg | ORAL_TABLET | Freq: Every day | ORAL | 0 refills | Status: DC

## 2018-01-18 ENCOUNTER — Other Ambulatory Visit: Payer: Self-pay | Admitting: Family Medicine

## 2018-01-18 NOTE — Telephone Encounter (Signed)
Is this okay to refill? 

## 2018-01-27 ENCOUNTER — Encounter: Payer: Self-pay | Admitting: Family Medicine

## 2018-02-23 ENCOUNTER — Other Ambulatory Visit: Payer: Self-pay | Admitting: Family Medicine

## 2018-02-23 DIAGNOSIS — F418 Other specified anxiety disorders: Secondary | ICD-10-CM

## 2018-02-23 DIAGNOSIS — I10 Essential (primary) hypertension: Secondary | ICD-10-CM

## 2018-03-08 ENCOUNTER — Other Ambulatory Visit: Payer: Self-pay | Admitting: Family Medicine

## 2018-03-08 DIAGNOSIS — E7800 Pure hypercholesterolemia, unspecified: Secondary | ICD-10-CM

## 2018-03-24 ENCOUNTER — Other Ambulatory Visit: Payer: Self-pay | Admitting: Family Medicine

## 2018-03-24 DIAGNOSIS — E7800 Pure hypercholesterolemia, unspecified: Secondary | ICD-10-CM

## 2018-03-24 MED ORDER — ATORVASTATIN CALCIUM 40 MG PO TABS: 40.0000 mg | ORAL_TABLET | Freq: Every day | ORAL | 0 refills | Status: DC

## 2018-03-24 NOTE — Telephone Encounter (Signed)
Is this okay to refill? 

## 2018-03-25 ENCOUNTER — Other Ambulatory Visit: Payer: Self-pay | Admitting: Family Medicine

## 2018-03-25 DIAGNOSIS — E7800 Pure hypercholesterolemia, unspecified: Secondary | ICD-10-CM

## 2018-03-31 ENCOUNTER — Encounter: Payer: 59 | Admitting: Family Medicine

## 2018-04-18 ENCOUNTER — Encounter: Payer: Self-pay | Admitting: Family Medicine

## 2018-04-20 ENCOUNTER — Other Ambulatory Visit: Payer: 59

## 2018-04-21 ENCOUNTER — Encounter: Payer: Self-pay | Admitting: Family Medicine

## 2018-04-22 ENCOUNTER — Encounter: Payer: 59 | Admitting: Family Medicine

## 2018-04-26 ENCOUNTER — Other Ambulatory Visit: Payer: Self-pay | Admitting: Family Medicine

## 2018-04-26 ENCOUNTER — Encounter: Payer: Self-pay | Admitting: Family Medicine

## 2018-04-26 DIAGNOSIS — F418 Other specified anxiety disorders: Secondary | ICD-10-CM

## 2018-04-26 DIAGNOSIS — I10 Essential (primary) hypertension: Secondary | ICD-10-CM

## 2018-04-27 ENCOUNTER — Other Ambulatory Visit: Payer: Self-pay | Admitting: Family Medicine

## 2018-04-27 DIAGNOSIS — F418 Other specified anxiety disorders: Secondary | ICD-10-CM

## 2018-04-27 DIAGNOSIS — I10 Essential (primary) hypertension: Secondary | ICD-10-CM

## 2018-04-27 DIAGNOSIS — M109 Gout, unspecified: Secondary | ICD-10-CM

## 2018-04-27 MED ORDER — ATENOLOL 25 MG PO TABS: 25.0000 mg | ORAL_TABLET | Freq: Every day | ORAL | 0 refills | Status: DC

## 2018-04-27 NOTE — Telephone Encounter (Signed)
Is this ok to refill until patient's appointment in august

## 2018-05-28 ENCOUNTER — Other Ambulatory Visit: Payer: Self-pay | Admitting: Family Medicine

## 2018-05-28 DIAGNOSIS — M109 Gout, unspecified: Secondary | ICD-10-CM

## 2018-05-28 DIAGNOSIS — I10 Essential (primary) hypertension: Secondary | ICD-10-CM

## 2018-05-28 DIAGNOSIS — F418 Other specified anxiety disorders: Secondary | ICD-10-CM

## 2018-06-01 ENCOUNTER — Encounter: Payer: Self-pay | Admitting: Family Medicine

## 2018-06-01 NOTE — Progress Notes (Signed)
Chief Complaint  Patient presents with  . Medication Management    Anxiety: Overall is much better, since no longer working at prior job. 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  Will be traveling for 3 weeks, some of which is business.  He thinks he left his bottle on his boat. Goes many days without taking it. Thinks he may have 8-10 left, not getting back there until September, requesting refill today. Denies sedation, and it is very effective. Last refilled #30 on 03/24/18.  Prior refills were 01/18/18, 10/15/17.  (Previously, given the frequent need of alprazolam, related to job stress, we had tried him on Lexapro. He did not tolerate lexapro (diarrhea, nausea, worsened reflux symptoms, decreased appetite, jitteriness--he took it for 2 weeks only, didn't have problems on the 1/2 tablet. Didn't re-try at 1/2 dose, as he misplaced the bottle)).   Hypertension: He reports compliance with his medication regimen of Atenolol 25mg  andlisinopril. He takes them both first thing in the morning. His Om[employer] BP monitor (arm) was verified as accurate in 03/2017, and re- verified at last visit in January. BP's have been running: mornings--125-165/77-96, usually highest when he first wakes up.  We discussed not taking both meds together, but doesn't remember to take them unless he takes it in the morning.  BP's afternoon 111-153/72-90. Evenings 102-162/56-90--seems to be lowest latest in the evening (after wine); exercise also helps bring it down.  Brings in new cuff he bought 2-3 days ago (wrist), which he reports correlates with his arm brand. Showed significant variability and inconsistent with nurse and MD checks today (not accurate).  Denies headaches, dizziness, side effects of medication.  Follows low sodium diet.  Walking 1 hours/day (dog walk), some bike riding. When on boat, road bike to [facility], and did it daily. Hasn't gone to the one here  yet. Recently started playing pickeball, really enjoys this [employer] outdoors, [employer] indoors).  Impaired fasting glucose--"I've been celebrating too much"--more alcohol, carbs (pasta) than in the past. Drinking 4 glasses of wine daily.  GERD--He uses Prilosex OTC only prn, prior to a late/large meal. Still sleeps with HOB elevated. Denies dysphagia.  Hyperlipidemia follow-up:  Patient is reportedly following a low-fat, low cholesterol diet. Compliant with medications (40mg  atorvastatin) and denies medication side effects.  He had labs done prior to visit, see below.  Gout--denies any flares. Prior gout was foot/ankle/toes.  Compliant with allopurinol without side effects.  PMH, PSH, SH reviewed  Outpatient Encounter Medications as of 06/03/2018  Medication Sig  . allopurinol (ZYLOPRIM) 100 MG tablet TAKE ONE TABLET BY MOUTH DAILY  . allopurinol (ZYLOPRIM) 100 MG tablet TAKE ONE TABLET BY MOUTH DAILY  . ALPRAZolam (XANAX) 0.5 MG tablet TAKE ONE TABLET BY MOUTH DAILY AS NEEDED FOR ANXIETY  . atenolol (TENORMIN) 25 MG tablet TAKE ONE TABLET BY MOUTH DAILY  . atenolol (TENORMIN) 25 MG tablet TAKE 1 TABLET BY MOUTH DAILY  . atorvastatin (LIPITOR) 40 MG tablet Take 1 tablet (40 mg total) by mouth daily.  Marland Kitchen lisinopril (PRINIVIL,ZESTRIL) 20 MG tablet TAKE ONE TABLET BY MOUTH DAILY  . omeprazole (PRILOSEC) 40 MG capsule Take 1 capsule (40 mg total) by mouth daily.  . [DISCONTINUED] ALPRAZolam (XANAX) 0.5 MG tablet TAKE ONE TABLET BY MOUTH DAILY AS NEEDED FOR ANXIETY  . [DISCONTINUED] atorvastatin (LIPITOR) 40 MG tablet Take 1 tablet (40 mg total) by mouth daily.   No facility-administered encounter medications on file as of 06/03/2018.  No Known Allergies  ROS:  No fever, chills, headaches, dizziness, chest pain, shortness of breath, nausea, vomiting, bowel changes, abdominal pain, urinary complaints, bleeding, bruising, rash, depression, or other complaints. Some snoring,  not aware of apnea.  Feels refreshed when he wakes up, no morning headaches or daytime somnolence.  PHYSICAL EXAM:  BP 140/86   Pulse 61   Temp 97.8 F (36.6 C) (Oral)   Ht 5\' 10"  (1.778 m)   Wt 184 lb 12.8 oz (83.8 kg)   SpO2 97%   BMI 26.52 kg/m   134/80 on repeat by MD Was much higher on wrist Omron monitor  Wt Readings from Last 3 Encounters:  06/03/18 184 lb 12.8 oz (83.8 kg)  11/02/17 184 lb 6.4 oz (83.6 kg)  03/30/17 181 lb 12.8 oz (82.5 kg)    Well-appearing, pleasant male in no distress HEENT: conjunctiva and sclera are clear, EOMI. OP clear Neck: No lymphadenopathy, thyromegaly or mass, no bruit Heart: regular rate and rhythm, no murmur Lungs: clear bilaterally Back: no spinal or CVA tenderness Abdomen: soft, nontender, no organomegaly or mass Extremities: no edema, normal pulses Psych: normal mood, affect, hygiene and grooming Skin: normal turgor, no rash/lesions Neuro: alert and oriented, cranial nerves intact, normal gait  Lab Results  Component Value Date   CHOL 209 (H) 06/02/2018   HDL 55 06/02/2018   LDLCALC 121 (H) 06/02/2018   TRIG 165 (H) 06/02/2018   CHOLHDL 3.8 06/02/2018     Chemistry      Component Value Date/Time   NA 140 06/02/2018 0922   K 4.4 06/02/2018 0922   CL 99 06/02/2018 0922   CO2 25 06/02/2018 0922   BUN 12 06/02/2018 0922   CREATININE 1.01 06/02/2018 0922   CREATININE 1.06 03/26/2017 0740      Component Value Date/Time   CALCIUM 9.7 06/02/2018 0922   ALKPHOS 83 06/02/2018 0922   AST 34 06/02/2018 0922   ALT 59 (H) 06/02/2018 0922   BILITOT 0.8 06/02/2018 0922     Fasting glucose 113  Lab Results  Component Value Date   HGBA1C 5.7 (H) 06/02/2018    Lab Results  Component Value Date   LABURIC 6.6 06/02/2018   Lab Results  Component Value Date   TSH 3.130 06/02/2018    Lab Results  Component Value Date   WBC 6.5 06/02/2018   HGB 15.1 06/02/2018   HCT 43.9 06/02/2018   MCV 93 06/02/2018   PLT 264  06/02/2018     ASSESSMENT/PLAN:  Essential hypertension, benign - borderline, some high values at home; cut back on alcohol, cont current meds and monitoring. Consider splitting med times, if able to remember daily  Gastroesophageal reflux disease without esophagitis - doing well with just prn OTC meds  Gout of right foot, unspecified cause, unspecified chronicity - no flares; uric acid levels a little higher. cont allopurinol; cut back on alcohol  Pure hypercholesterolemia - elevated TG; reviewed diet, importance of cutting back on alcohol - Plan: atorvastatin (LIPITOR) 40 MG tablet, Lipid panel  Anxiety state - cont prn alprazolam--not needing very frequently, usually bottle lasts 3 mos (left current bottle out of town). - Plan: ALPRAZolam (XANAX) 0.5 MG tablet  Impaired fasting glucose - much higher than on last check; counseled re: risks, diet in detail. Cut back on alcohol, carbs - Plan: Hemoglobin A1c, Glucose, random  Elevated liver function tests - Plan: Hepatic function panel   Will call in Sept with name of new pharmacy to send 90d  rx's to    F/u 6 months with labs prior A1c, glu, LFT, lipids

## 2018-06-02 ENCOUNTER — Encounter: Payer: 59 | Admitting: Family Medicine

## 2018-06-02 ENCOUNTER — Other Ambulatory Visit: Payer: Self-pay | Admitting: Family Medicine

## 2018-06-02 ENCOUNTER — Other Ambulatory Visit: Payer: 59

## 2018-06-02 DIAGNOSIS — R7301 Impaired fasting glucose: Secondary | ICD-10-CM

## 2018-06-02 DIAGNOSIS — Z5181 Encounter for therapeutic drug level monitoring: Secondary | ICD-10-CM

## 2018-06-02 DIAGNOSIS — I10 Essential (primary) hypertension: Secondary | ICD-10-CM

## 2018-06-02 DIAGNOSIS — E7800 Pure hypercholesterolemia, unspecified: Secondary | ICD-10-CM

## 2018-06-02 DIAGNOSIS — M109 Gout, unspecified: Secondary | ICD-10-CM

## 2018-06-02 DIAGNOSIS — Z0000 Encounter for general adult medical examination without abnormal findings: Secondary | ICD-10-CM

## 2018-06-02 NOTE — Telephone Encounter (Signed)
[facility] is requesting to fill pt xanax. Please advise [facility] 

## 2018-06-02 NOTE — Addendum Note (Signed)
Addended by: Edgar Frisk on: 06/02/2018 09:19 AM   Modules accepted: Orders

## 2018-06-02 NOTE — Telephone Encounter (Signed)
Pt can wait until his appt tomorrow. I will deny med

## 2018-06-02 NOTE — Telephone Encounter (Signed)
He has appointment tomorrow. Check with pt to see if he can wait to get rx at visit tomorrow.  (last filled 5/29)

## 2018-06-02 NOTE — Telephone Encounter (Signed)
Sent to me by mistake 

## 2018-06-02 NOTE — Telephone Encounter (Signed)
Left message for pt to call me back 

## 2018-06-03 ENCOUNTER — Ambulatory Visit: Payer: 59 | Admitting: Family Medicine

## 2018-06-03 ENCOUNTER — Encounter: Payer: Self-pay | Admitting: Family Medicine

## 2018-06-03 VITALS — BP 140/86 | HR 61 | Temp 97.8°F | Ht 70.0 in | Wt 184.8 lb

## 2018-06-03 DIAGNOSIS — E7800 Pure hypercholesterolemia, unspecified: Secondary | ICD-10-CM

## 2018-06-03 DIAGNOSIS — M109 Gout, unspecified: Secondary | ICD-10-CM

## 2018-06-03 DIAGNOSIS — I10 Essential (primary) hypertension: Secondary | ICD-10-CM

## 2018-06-03 DIAGNOSIS — F411 Generalized anxiety disorder: Secondary | ICD-10-CM

## 2018-06-03 DIAGNOSIS — K219 Gastro-esophageal reflux disease without esophagitis: Secondary | ICD-10-CM | POA: Diagnosis not present

## 2018-06-03 DIAGNOSIS — R7301 Impaired fasting glucose: Secondary | ICD-10-CM

## 2018-06-03 DIAGNOSIS — R945 Abnormal results of liver function studies: Secondary | ICD-10-CM

## 2018-06-03 DIAGNOSIS — R7989 Other specified abnormal findings of blood chemistry: Secondary | ICD-10-CM

## 2018-06-03 LAB — LIPID PANEL
CHOLESTEROL TOTAL: 209 mg/dL — AB (ref 100–199)
Chol/HDL Ratio: 3.8 ratio (ref 0.0–5.0)
HDL: 55 mg/dL (ref >39)
LDL Calculated: 121 mg/dL — ABNORMAL HIGH (ref 0–99)
Triglycerides: 165 mg/dL — ABNORMAL HIGH (ref 0–149)
VLDL Cholesterol Cal: 33 mg/dL (ref 5–40)

## 2018-06-03 LAB — HEMOGLOBIN A1C
ESTIMATED AVERAGE GLUCOSE: 117 mg/dL
HEMOGLOBIN A1C: 5.7 % — AB (ref 4.8–5.6)

## 2018-06-03 LAB — CBC WITH DIFFERENTIAL/PLATELET
BASOS ABS: 0.1 10*3/uL (ref 0.0–0.2)
Basos: 1 %
EOS (ABSOLUTE): 0.1 10*3/uL (ref 0.0–0.4)
Eos: 1 %
Hematocrit: 43.9 % (ref 37.5–51.0)
Hemoglobin: 15.1 g/dL (ref 13.0–17.7)
Immature Grans (Abs): 0 10*3/uL (ref 0.0–0.1)
Immature Granulocytes: 0 %
LYMPHS: 30 %
Lymphocytes Absolute: 1.9 10*3/uL (ref 0.7–3.1)
MCH: 31.9 pg (ref 26.6–33.0)
MCHC: 34.4 g/dL (ref 31.5–35.7)
MCV: 93 fL (ref 79–97)
Monocytes Absolute: 0.7 10*3/uL (ref 0.1–0.9)
Monocytes: 10 %
NEUTROS ABS: 3.7 10*3/uL (ref 1.4–7.0)
Neutrophils: 58 %
PLATELETS: 264 10*3/uL (ref 150–450)
RBC: 4.73 x10E6/uL (ref 4.14–5.80)
RDW: 13.6 % (ref 12.3–15.4)
WBC: 6.5 10*3/uL (ref 3.4–10.8)

## 2018-06-03 LAB — COMPREHENSIVE METABOLIC PANEL
ALT: 59 IU/L — AB (ref 0–44)
AST: 34 IU/L (ref 0–40)
Albumin/Globulin Ratio: 2 (ref 1.2–2.2)
Albumin: 4.2 g/dL (ref 3.6–4.8)
Alkaline Phosphatase: 83 IU/L (ref 39–117)
BUN/Creatinine Ratio: 12 (ref 10–24)
BUN: 12 mg/dL (ref 8–27)
Bilirubin Total: 0.8 mg/dL (ref 0.0–1.2)
CALCIUM: 9.7 mg/dL (ref 8.6–10.2)
CO2: 25 mmol/L (ref 20–29)
Chloride: 99 mmol/L (ref 96–106)
Creatinine, Ser: 1.01 mg/dL (ref 0.76–1.27)
GFR, EST AFRICAN AMERICAN: 90 mL/min/{1.73_m2} (ref >59)
GFR, EST NON AFRICAN AMERICAN: 78 mL/min/{1.73_m2} (ref >59)
GLUCOSE: 113 mg/dL — AB (ref 65–99)
Globulin, Total: 2.1 g/dL (ref 1.5–4.5)
Potassium: 4.4 mmol/L (ref 3.5–5.2)
Sodium: 140 mmol/L (ref 134–144)
TOTAL PROTEIN: 6.3 g/dL (ref 6.0–8.5)

## 2018-06-03 LAB — TSH: TSH: 3.13 u[IU]/mL (ref 0.450–4.500)

## 2018-06-03 LAB — URIC ACID: URIC ACID: 6.6 mg/dL (ref 3.7–8.6)

## 2018-06-03 MED ORDER — ALPRAZOLAM 0.5 MG PO TABS: ORAL_TABLET | ORAL | 0 refills | Status: DC

## 2018-06-03 MED ORDER — ATORVASTATIN CALCIUM 40 MG PO TABS: 40.0000 mg | ORAL_TABLET | Freq: Every day | ORAL | 0 refills | Status: DC

## 2018-06-03 NOTE — Patient Instructions (Signed)
Blood pressures look okay. Your wrist monitor is not accurate.  Your sugar is up and your triglycerides. Cut back on alcohol to 1-2/day. Continue regular exercise. Continue to monitor your blood pressure with the arm monitors that we have previously checked, and record additional "comments" including exercise, or other reasons it might be high or low.  Let us know the preferred pharmacy for 90 day prescriptions for September.

## 2018-06-30 ENCOUNTER — Other Ambulatory Visit: Payer: Self-pay | Admitting: Family Medicine

## 2018-06-30 DIAGNOSIS — M109 Gout, unspecified: Secondary | ICD-10-CM

## 2018-06-30 DIAGNOSIS — I10 Essential (primary) hypertension: Secondary | ICD-10-CM

## 2018-06-30 DIAGNOSIS — F418 Other specified anxiety disorders: Secondary | ICD-10-CM

## 2018-07-19 ENCOUNTER — Other Ambulatory Visit: Payer: Self-pay | Admitting: Family Medicine

## 2018-07-19 DIAGNOSIS — E7800 Pure hypercholesterolemia, unspecified: Secondary | ICD-10-CM

## 2018-08-04 ENCOUNTER — Other Ambulatory Visit (INDEPENDENT_AMBULATORY_CARE_PROVIDER_SITE_OTHER): Payer: 59

## 2018-08-04 ENCOUNTER — Other Ambulatory Visit: Payer: Self-pay | Admitting: Family Medicine

## 2018-08-04 DIAGNOSIS — Z23 Encounter for immunization: Secondary | ICD-10-CM | POA: Diagnosis not present

## 2018-08-04 DIAGNOSIS — F411 Generalized anxiety disorder: Secondary | ICD-10-CM

## 2018-08-04 NOTE — Telephone Encounter (Signed)
Last filled 8/8, #30 lasted 2 mos. refilled

## 2018-08-04 NOTE — Telephone Encounter (Signed)
[facility] is requesting to fill pt xanax. Please advise [facility] 

## 2018-09-27 ENCOUNTER — Other Ambulatory Visit: Payer: Self-pay | Admitting: Family Medicine

## 2018-09-27 DIAGNOSIS — F418 Other specified anxiety disorders: Secondary | ICD-10-CM

## 2018-09-27 DIAGNOSIS — I10 Essential (primary) hypertension: Secondary | ICD-10-CM

## 2018-10-04 ENCOUNTER — Other Ambulatory Visit: Payer: Self-pay | Admitting: Family Medicine

## 2018-10-04 DIAGNOSIS — F411 Generalized anxiety disorder: Secondary | ICD-10-CM

## 2018-10-04 MED ORDER — ALPRAZOLAM 0.5 MG PO TABS: 0.2500 mg | ORAL_TABLET | Freq: Three times a day (TID) | ORAL | 0 refills | Status: DC | PRN

## 2018-10-04 NOTE — Telephone Encounter (Signed)
Last filled 10/9, lasted 2 mos (same as prior fill)

## 2018-10-04 NOTE — Telephone Encounter (Signed)
Is this okay to refill? 

## 2018-10-25 ENCOUNTER — Other Ambulatory Visit: Payer: Self-pay | Admitting: *Deleted

## 2018-10-25 ENCOUNTER — Encounter: Payer: Self-pay | Admitting: Family Medicine

## 2018-10-25 DIAGNOSIS — I10 Essential (primary) hypertension: Secondary | ICD-10-CM

## 2018-10-25 DIAGNOSIS — F418 Other specified anxiety disorders: Secondary | ICD-10-CM

## 2018-10-25 MED ORDER — ATENOLOL 25 MG PO TABS: 25.0000 mg | ORAL_TABLET | Freq: Every day | ORAL | 0 refills | Status: DC

## 2018-11-25 ENCOUNTER — Other Ambulatory Visit: Payer: 59

## 2018-11-29 ENCOUNTER — Encounter: Payer: 59 | Admitting: Family Medicine

## 2018-11-30 ENCOUNTER — Encounter: Payer: Self-pay | Admitting: Family Medicine

## 2019-01-05 ENCOUNTER — Other Ambulatory Visit: Payer: Self-pay | Admitting: Family Medicine

## 2019-01-05 DIAGNOSIS — F411 Generalized anxiety disorder: Secondary | ICD-10-CM

## 2019-01-05 MED ORDER — ALPRAZOLAM 0.5 MG PO TABS: 0.2500 mg | ORAL_TABLET | Freq: Three times a day (TID) | ORAL | 0 refills | Status: DC | PRN

## 2019-01-05 NOTE — Telephone Encounter (Signed)
Is this okay to refill? 

## 2019-01-05 NOTE — Telephone Encounter (Signed)
Last filed 12/9, and prior to that 10/9 (so, decreasing in frequency of use).  Has appt next week.  Refilled

## 2019-01-12 ENCOUNTER — Encounter: Payer: Self-pay | Admitting: Family Medicine

## 2019-01-13 ENCOUNTER — Other Ambulatory Visit: Payer: Self-pay | Admitting: Family Medicine

## 2019-01-13 DIAGNOSIS — E7800 Pure hypercholesterolemia, unspecified: Secondary | ICD-10-CM

## 2019-01-18 ENCOUNTER — Encounter: Payer: Self-pay | Admitting: Family Medicine

## 2019-01-19 ENCOUNTER — Other Ambulatory Visit: Payer: Self-pay | Admitting: Family Medicine

## 2019-01-19 DIAGNOSIS — F418 Other specified anxiety disorders: Secondary | ICD-10-CM

## 2019-01-19 DIAGNOSIS — I10 Essential (primary) hypertension: Secondary | ICD-10-CM

## 2019-01-19 DIAGNOSIS — E7800 Pure hypercholesterolemia, unspecified: Secondary | ICD-10-CM

## 2019-01-19 DIAGNOSIS — M109 Gout, unspecified: Secondary | ICD-10-CM

## 2019-01-19 MED ORDER — LISINOPRIL 20 MG PO TABS: 20.0000 mg | ORAL_TABLET | Freq: Every day | ORAL | 0 refills | Status: DC

## 2019-01-19 MED ORDER — ATORVASTATIN CALCIUM 40 MG PO TABS: 40.0000 mg | ORAL_TABLET | Freq: Every day | ORAL | 0 refills | Status: DC

## 2019-01-19 MED ORDER — ATENOLOL 25 MG PO TABS: 25.0000 mg | ORAL_TABLET | Freq: Every day | ORAL | 0 refills | Status: DC

## 2019-01-19 MED ORDER — ALLOPURINOL 100 MG PO TABS: ORAL_TABLET | ORAL | 0 refills | Status: DC

## 2019-01-20 ENCOUNTER — Ambulatory Visit: Payer: 59 | Admitting: Family Medicine

## 2019-01-20 ENCOUNTER — Encounter: Payer: 59 | Admitting: Family Medicine

## 2019-04-04 ENCOUNTER — Other Ambulatory Visit: Payer: 59

## 2019-04-04 ENCOUNTER — Other Ambulatory Visit: Payer: Self-pay

## 2019-04-04 DIAGNOSIS — R7989 Other specified abnormal findings of blood chemistry: Secondary | ICD-10-CM

## 2019-04-04 DIAGNOSIS — E7800 Pure hypercholesterolemia, unspecified: Secondary | ICD-10-CM

## 2019-04-04 DIAGNOSIS — R7301 Impaired fasting glucose: Secondary | ICD-10-CM

## 2019-04-05 LAB — LIPID PANEL
Chol/HDL Ratio: 3.4 ratio (ref 0.0–5.0)
Cholesterol, Total: 178 mg/dL (ref 100–199)
HDL: 53 mg/dL (ref >39)
LDL Calculated: 105 mg/dL — ABNORMAL HIGH (ref 0–99)
Triglycerides: 99 mg/dL (ref 0–149)
VLDL Cholesterol Cal: 20 mg/dL (ref 5–40)

## 2019-04-05 LAB — HEPATIC FUNCTION PANEL
ALT: 34 IU/L (ref 0–44)
AST: 25 IU/L (ref 0–40)
Albumin: 4.9 g/dL — ABNORMAL HIGH (ref 3.8–4.8)
Alkaline Phosphatase: 73 IU/L (ref 39–117)
Bilirubin Total: 0.8 mg/dL (ref 0.0–1.2)
Bilirubin, Direct: 0.18 mg/dL (ref 0.00–0.40)
Total Protein: 7 g/dL (ref 6.0–8.5)

## 2019-04-05 LAB — HEMOGLOBIN A1C
Est. average glucose Bld gHb Est-mCnc: 114 mg/dL
Hgb A1c MFr Bld: 5.6 % (ref 4.8–5.6)

## 2019-04-05 LAB — GLUCOSE, RANDOM: Glucose: 99 mg/dL (ref 65–99)

## 2019-04-06 NOTE — Patient Instructions (Addendum)
  HEALTH MAINTENANCE RECOMMENDATIONS:  It is recommended that you get at least 30 minutes of aerobic exercise at least 5 days/week (for weight loss, you may need as much as 60-90 minutes). This can be any activity that gets your heart rate up. This can be divided in 10-15 minute intervals if needed, but try and build up your endurance at least once a week.  Weight bearing exercise is also recommended twice weekly.  Eat a healthy diet with lots of vegetables, fruits and fiber.  "Colorful" foods have a lot of vitamins (ie green vegetables, tomatoes, red peppers, etc).  Limit sweet tea, regular sodas and alcoholic beverages, all of which has a lot of calories and sugar.  Up to 2 alcoholic drinks daily may be beneficial for men (unless trying to lose weight, watch sugars).  Drink a lot of water.  Sunscreen of at least SPF 30 should be used on all sun-exposed parts of the skin when outside between the hours of 10 am and 4 pm (not just when at beach or pool, but even with exercise, golf, tennis, and yard work!)  Use a sunscreen that says "broad spectrum" so it covers both UVA and UVB rays, and make sure to reapply every 1-2 hours.  Remember to change the batteries in your smoke detectors when changing your clock times in the spring and fall. Carbon monoxide detectors are recommended for your home.  Use your seat belt every time you are in a car, and please drive safely and not be distracted with cell phones and texting while driving.   Jeremy Bush , Thank you for taking time to come for your Welcome to Medicare Visit. I appreciate your ongoing commitment to your health goals. Please review the following plan we discussed and let me know if I can assist you in the future.   This is a list of the screening recommended for you and due dates:  Health Maintenance  Topic Date Due  . HIV Screening  11/10/1970  . Pneumonia vaccines (1 of 2 - PCV13) 11/10/2018  . Flu Shot  05/28/2019  . Tetanus Vaccine   01/14/2023  . Colon Cancer Screening  11/06/2027  .  Hepatitis C: One time screening is recommended by Center for Disease Control  (CDC) for  adults born from [birth year] through 1967.   Completed   You were given Prevnar-13 today. You will get pneumovax (the other type of pneumonia vaccine) next year.  Continue routine follow-ups with [facility].  Continue yearly flu shots--you should now be getting the "high dose" flu shots for those 65 and older.  You should get this in the Fall (Sept/October).  I recommend getting the new shingles vaccine (Shingrix). You will need to check with your insurance to see if it is covered, and if covered by Medicare Part D, you need to get from the pharmacy rather than our office. I suspect that your [facility] (not medicare) will cover it and you can get it from the office.  Call the office to schedule nurse visit after verifying this.  It is a series of 2 injections, spaced 2 months apart.  Continue to monitor your blood pressure. Return sooner than 1 year if blood pressures are high or if you have any other concerns.

## 2019-04-06 NOTE — Progress Notes (Signed)
Chief Complaint  Patient presents with  . Medicare Wellness    welcome to medicare, no other concerns     Jeremy Bush is a 63 y.o. male who presents for Welcome to Medicare visit and follow-up on chronic medical conditions.  He has the following concerns:  Daughter lived with them for 8 weeks; she is a Physiological scientist, and has gotten him back on track with a good exercise program.  Cut back significantly on alcohol--2-3 glasses of wine at a time, but only 2x/week. He feels stronger, less anxious, sleeping well (needing unisom less often). 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  He has been active playing tennis, pickleball, golf (walking course).  Hypertension: He reports compliance with his medication regimen of Atenolol 25mg  andlisinopril 20mg . He takes them both first thing in the morning.HisOmron BP monitor (arm) was verified as accurate in 03/2017, and re- verified 10/2017. Also checked his wrist monitor--which did not seem accurate like the arm one was. BP'shave been running: 102-150/60's-84. He sees the highest values in the morning (prior to taking both of his meds), better later in the day and after exercise. Some higher BP's noted in March, 160/100 (when not exercising as much, and also was monitored with the inaccurate wrist monitor).  Denies headaches, dizziness, side effects of medication. Follows low sodium diet.  Impaired fasting glucose--diet remains healthy, less alcohol and regular exercise as above.  GERD--much improved.  Hasn't needed any prilosec in 6-8 weeks. No longer eating large/late meals, and less alcohol.  Hyperlipidemia follow-up: Patient is reportedly following a low-fat, low cholesterol diet. Compliant with medications (40mg  atorvastatin)and denies medication side effects.  He had labs done prior to visit, see below.  Gout--denies any flares. Prior gout was foot/ankle/toes.  Compliant with allopurinol without side effects.  Anxiety: He uses the alprazolam "every now and then" when his mind is worrying, thinking too much about things. A prescription lasts 3 months, sometimes doesn't take any in over a week. Only takes 1/2 tablet at a time.   Alprazolam last filled 01/05/2019, prior to that was in December, and had been about every 2 months prior to that. It remains very effective, when needed, and does not cause sedation. He doesn't recall if he needs a refill today.  Prostate cancer--under care of Dr. Karsten Ro. S/p radioactive seed implant. Dong well, has follow-up scheduled.  Immunization History  Administered Date(s) Administered  . Influenza Split 07/22/2011, 07/29/2012  . Influenza,inj,Quad PF,6+ Mos 07/28/2013, 08/02/2014, 11/01/2015, 06/25/2016, 09/15/2017, 08/04/2018  . Td 09/15/2005  . Tdap 01/13/2013  . Zoster 08/02/2014   Last colonoscopy: 10/2017 hyperplastic polyp and hemorrhoids.  (h/o adenomatous polyps).  Dr. Michail Sermon (pt told him 10 year f/u) Last PSA: UTD per urologist-- 0.051 in 12/2017 per last note received from [facility]ist: 4 times yearly (for cleanings) Ophtho: yearly Exercise:  Tennis, pickleball, golf (walking course).  HIIT workouts (ladder workouts), as recommended by his daughter.  Uses 15# dumbbells, 4x/week x 1 hours.  Other doctors caring for patient include: GI: Dr. Michail Sermon Urologist: Dr. Karsten Ro Dentist: Dr. Christie Nottingham Ophtho: [facility] (Dr. Claudean Kinds, saw him before he retired)   Depression screen:  Negative Fall screen: negative Functional status survey: unremarkable Mini-Cog screen: normal See full screens in Epic.   End of Life Discussion:  Patient does not have a living will and medical power of attorney. Paperwork given  Past Medical History:  Diagnosis Date  . Anxiety    related  to work stress  . Colonic polyp 2007, 2013  . Elevated uric acid in blood    with possible gout flare x 2  . Hyperlipidemia    . Hypertension   . Nocturia   . Prostate cancer ([place]) 03/24/12   gleason 6, vol 31.3 cc    Past Surgical History:  Procedure Laterality Date  . BIOPSY PROSTATE  03/24/12   gleason 3+3=6/prostate volume 31 cc's  (md office)  . CYSTOSCOPY  08/13/2012   Procedure: CYSTOSCOPY FLEXIBLE;  Surgeon: Claybon Jabs, MD;  Location: [facility];  Service: Urology;  Laterality: N/A;  no seeds found in bladder  . RADIOACTIVE SEED IMPLANT  08/13/2012   Procedure: RADIOACTIVE SEED IMPLANT;  Surgeon: Claybon Jabs, MD;  Location: [facility];  Service: Urology;  Laterality: N/A;   79    seeds implanted    Social History   Socioeconomic History  . Marital status: Married    Spouse name: Not on file  . Number of children: Not on file  . Years of education: Not on file  . Highest education level: Not on file  Occupational History  . Not on file  Social Needs  . Financial resource strain: Not on file  . Food insecurity    Worry: Not on file    Inability: Not on file  . Transportation needs    Medical: Not on file    Non-medical: Not on file  Tobacco Use  . Smoking status: Never Smoker  . Smokeless tobacco: Never Used  Substance and Sexual Activity  . Alcohol use: Yes    Alcohol/week: 14.0 standard drinks    Types: 14 Glasses of wine per week    Comment: 2-3 glasses of wine 2-3x/week  . Drug use: No  . Sexual activity: Yes    Partners: Female  Lifestyle  . Physical activity    Days per week: Not on file    Minutes per session: Not on file  . Stress: Not on file  Relationships  . Social Herbalist on phone: Not on file    Gets together: Not on file    Attends religious service: Not on file    Active member of club or organization: Not on file    Attends meetings of clubs or organizations: Not on file    Relationship status: Not on file  . Intimate partner violence    Fear of current or ex partner: Not on file    Emotionally abused: Not on  file    Physically abused: Not on file    Forced sexual activity: Not on file  Other Topics Concern  . Not on file  Social History Narrative   Lives with wife, 1 dog.  2 children and 1 stepchild.  2 daughters in [place] ([place] and [place]), stepson in [place]. 2 granddaughters    Family History  Problem Relation Age of Onset  . Cancer Mother        lung (smoker)  . Hypertension Mother   . Hyperlipidemia Mother   . Stroke Father        mini-stroke  . Hypertension Father   . Kidney disease Father        on dialysis  . Diabetes Father   . Healthy Sister     Outpatient Encounter Medications as of 04/07/2019  Medication Sig Note  . allopurinol (ZYLOPRIM) 100 MG tablet TAKE ONE TABLET BY MOUTH DAILY   . ALPRAZolam (XANAX) 0.5  MG tablet Take 0.5-1 tablets (0.25-0.5 mg total) by mouth 3 (three) times daily as needed for anxiety.   Marland Kitchen atenolol (TENORMIN) 25 MG tablet Take 1 tablet (25 mg total) by mouth daily.   Marland Kitchen atorvastatin (LIPITOR) 40 MG tablet Take 1 tablet (40 mg total) by mouth daily.   Marland Kitchen lisinopril (PRINIVIL,ZESTRIL) 20 MG tablet Take 1 tablet (20 mg total) by mouth daily.   Marland Kitchen omeprazole (PRILOSEC) 40 MG capsule Take 1 capsule (40 mg total) by mouth daily. (Patient not taking: Reported on 04/07/2019) 04/07/2019: Uses prilosec OTC very rarely, no longer takes rx   No facility-administered encounter medications on file as of 04/07/2019.     No Known Allergies  ROS: The patient denies anorexia, fever, headaches, vision loss, decreased hearing, ear pain, hoarseness, chest pain, palpitations, dizziness, syncope, dyspnea on exertion, cough, swelling, nausea, vomiting, diarrhea, constipation, abdominal pain, melena, hematochezia, hematuria, incontinence, erectile dysfunction, nocturia, weakened urine stream, dysuria, genital lesions, joint pains, numbness, tingling, weakness, tremor, suspicious skin lesions, depression, abnormal bleeding/bruising, or enlarged lymph nodes.  Anxiety per HPI,  improved.   PHYSICAL EXAM:  BP 126/84   Pulse 66   Temp 98.1 F (36.7 C) (Oral)   Ht 5' 9.75" (1.772 m)   Wt 179 lb 12.8 oz (81.6 kg)   SpO2 98%   BMI 25.98 kg/m    Wt Readings from Last 3 Encounters:  04/04/19 180 lb 3.2 oz (81.7 kg)  06/03/18 184 lb 12.8 oz (83.8 kg)  11/02/17 184 lb 6.4 oz (83.6 kg)   General Appearance:  Alert, cooperative, no distress, appears stated age   Head:  Normocephalic, without obvious abnormality, atraumatic   Eyes:  PERRL, conjunctiva/corneas clear, EOM's intact, fundi benign   Ears:  Normal TM's and external ear canals   Nose:  Not examined (wearing mask due to COVID-19, no related complaints)  Throat:  Not examined (wearing mask due to COVID-19, no related complaints)  Neck:  Supple, no lymphadenopathy; thyroid: no enlargement/ tenderness/nodules; no carotid bruit or JVD   Back:  Spine nontender, no curvature, ROM normal, no CVA tenderness   Lungs:  Clear to auscultation bilaterally without wheezes, rales or ronchi; respirations unlabored   Chest Wall:  No tenderness or deformity   Heart:  Regular rate and rhythm, S1 and S2 normal, no murmur, rub or gallop   Breast Exam:  No chest wall tenderness, masses or gynecomastia   Abdomen:  Soft, non-tender, nondistended, normoactive bowel sounds, no masses, no hepatosplenomegaly   Genitalia:  Deferred to urologist   Rectal:  Deferred to urologist   Extremities:  No clubbing, cyanosis or edema   Pulses:  2+ and symmetric all extremities   Skin:  Skin color, texture, turgor normal. +suntanned, especially forearms, neck. No suspicious lesions  Lymph nodes:  Cervical, supraclavicular, and axillary nodes normal   Neurologic:  CNII-XII intact, normal strength, sensation and gait; reflexes 2+ and symmetric throughout                         Psych:  Normal mood, affect, hygiene and grooming  Lab Results  Component Value Date   HGBA1C 5.6 04/04/2019   Fasting glucose  99    Chemistry      Component Value Date/Time   NA 139 04/04/2019 0856   K 5.0 04/04/2019 0856   CL 99 04/04/2019 0856   CO2 20 04/04/2019 0856   BUN 18 04/04/2019 0856   CREATININE 1.11 04/04/2019 0856  CREATININE 1.06 03/26/2017 0740      Component Value Date/Time   CALCIUM 10.2 04/04/2019 0856   ALKPHOS 73 04/04/2019 0856   AST 25 04/04/2019 0856   ALT 34 04/04/2019 0856   BILITOT 0.8 04/04/2019 0856     Lab Results  Component Value Date   CHOL 178 04/04/2019   HDL 53 04/04/2019   LDLCALC 105 (H) 04/04/2019   TRIG 99 04/04/2019   CHOLHDL 3.4 04/04/2019    EKG: sinus bradycardia, otherwise normal.   ASSESSMENT/PLAN:  Welcome to Medicare preventive visit - Plan: EKG 12-Lead  Essential hypertension, benign - controlled; continue current regimen, regular exercise, low Na diet - Plan: atenolol (TENORMIN) 25 MG tablet, lisinopril (ZESTRIL) 20 MG tablet  Gout of right foot, unspecified cause, unspecified chronicity - continue allopurinol and limiting alcohol - Plan: allopurinol (ZYLOPRIM) 100 MG tablet  Pure hypercholesterolemia - continue current regimen - Plan: atorvastatin (LIPITOR) 40 MG tablet  Anxiety state - overall improved; sparing use of alprazolam, reviewed other relaxation techniques, mindfulness   Impaired fasting glucose - improved; continue regular exercise, healthy diet, limiting alcohol   Need for pneumococcal vaccination - Plan: Pneumococcal conjugate vaccine 13-valent  Performance anxiety - will use beta blocker daily to help with both BP and anxiety (vs short-acting propranolol prn only) - Plan: atenolol (TENORMIN) 25 MG tablet   Prevnar-13 today Flu shots should be high dose (if he gets elsewhere) Has [facility] insurance in addition to his Medicare. He will check coverage re: shingrix.  Recommended at least 30 minutes of aerobic activity at least 5 days/week, weight-bearing exercise at least 2x/week; proper sunscreen use reviewed; healthy diet and  alcohol recommendations (less than or equal to 2 drinks/day) reviewed; regular seatbelt use; changing batteries in smoke detectors. Immunization recommendations discussed--continue yearly flu shots (high dose), Shingrix recommended, risks/SE reviewed. Pneumovax next year.Colonoscopy is UTD.  MOST form reviewed, filled out.  Full Code, Full Care He was given paperwork for Living Will, Healthcare POA , discussed importance and reason for having these. He was asked to get [place] copies when completed.  Medicare Attestation I have personally reviewed: The patient's medical and social history Their use of alcohol, tobacco or illicit drugs Their current medications and supplements The patient's functional ability including ADLs,fall risks, home safety risks, cognitive, and hearing and visual impairment Diet and physical activities Evidence for depression or mood disorders  The patient's weight, height, BMI, and visual acuity have been recorded in the chart.  I have made referrals, counseling, and provided education to the patient based on review of the above and I have provided the patient with a written personalized care plan for preventive services.     Vikki Ports, MD   04/07/2019

## 2019-04-07 ENCOUNTER — Other Ambulatory Visit: Payer: Self-pay

## 2019-04-07 ENCOUNTER — Ambulatory Visit (INDEPENDENT_AMBULATORY_CARE_PROVIDER_SITE_OTHER): Payer: 59 | Admitting: Family Medicine

## 2019-04-07 ENCOUNTER — Encounter: Payer: Self-pay | Admitting: Family Medicine

## 2019-04-07 VITALS — BP 126/84 | HR 66 | Temp 98.1°F | Ht 69.75 in | Wt 179.8 lb

## 2019-04-07 DIAGNOSIS — M109 Gout, unspecified: Secondary | ICD-10-CM | POA: Diagnosis not present

## 2019-04-07 DIAGNOSIS — E7800 Pure hypercholesterolemia, unspecified: Secondary | ICD-10-CM | POA: Diagnosis not present

## 2019-04-07 DIAGNOSIS — Z0000 Encounter for general adult medical examination without abnormal findings: Secondary | ICD-10-CM

## 2019-04-07 DIAGNOSIS — Z23 Encounter for immunization: Secondary | ICD-10-CM

## 2019-04-07 DIAGNOSIS — F411 Generalized anxiety disorder: Secondary | ICD-10-CM

## 2019-04-07 DIAGNOSIS — R7301 Impaired fasting glucose: Secondary | ICD-10-CM

## 2019-04-07 DIAGNOSIS — F418 Other specified anxiety disorders: Secondary | ICD-10-CM

## 2019-04-07 DIAGNOSIS — I10 Essential (primary) hypertension: Secondary | ICD-10-CM

## 2019-04-07 MED ORDER — ALLOPURINOL 100 MG PO TABS: ORAL_TABLET | ORAL | 3 refills | Status: DC

## 2019-04-07 MED ORDER — LISINOPRIL 20 MG PO TABS: 20.0000 mg | ORAL_TABLET | Freq: Every day | ORAL | 3 refills | Status: DC

## 2019-04-07 MED ORDER — ATORVASTATIN CALCIUM 40 MG PO TABS: 40.0000 mg | ORAL_TABLET | Freq: Every day | ORAL | 3 refills | Status: DC

## 2019-04-07 MED ORDER — ATENOLOL 25 MG PO TABS: 25.0000 mg | ORAL_TABLET | Freq: Every day | ORAL | 3 refills | Status: DC

## 2019-04-08 ENCOUNTER — Encounter: Payer: Self-pay | Admitting: Family Medicine

## 2019-04-11 ENCOUNTER — Other Ambulatory Visit: Payer: Self-pay | Admitting: Family Medicine

## 2019-04-11 DIAGNOSIS — F411 Generalized anxiety disorder: Secondary | ICD-10-CM

## 2019-04-11 LAB — BASIC METABOLIC PANEL
BUN/Creatinine Ratio: 16 (ref 10–24)
BUN: 18 mg/dL (ref 8–27)
CO2: 20 mmol/L (ref 20–29)
Calcium: 10.2 mg/dL (ref 8.6–10.2)
Chloride: 99 mmol/L (ref 96–106)
Creatinine, Ser: 1.11 mg/dL (ref 0.76–1.27)
GFR calc Af Amer: 80 mL/min/{1.73_m2} (ref >59)
GFR calc non Af Amer: 69 mL/min/{1.73_m2} (ref >59)
Glucose: 95 mg/dL (ref 65–99)
Potassium: 5 mmol/L (ref 3.5–5.2)
Sodium: 139 mmol/L (ref 134–144)

## 2019-04-11 LAB — SPECIMEN STATUS REPORT

## 2019-04-11 LAB — URIC ACID: Uric Acid: 6.7 mg/dL (ref 3.7–8.6)

## 2019-04-11 NOTE — Telephone Encounter (Signed)
Is this okay to refill? 

## 2019-06-20 ENCOUNTER — Other Ambulatory Visit: Payer: Self-pay | Admitting: Family Medicine

## 2019-06-20 DIAGNOSIS — F411 Generalized anxiety disorder: Secondary | ICD-10-CM

## 2019-06-20 NOTE — Telephone Encounter (Signed)
Last filled 6/15.  Per 6/11 visit: "He uses the alprazolam "every now and then" when his mind is worrying, thinking too much about things. A prescription lasts 3 months, sometimes doesn't take any in over a week. Only takes 1/2 tablet at a time.   Alprazolam last filled 01/05/2019, prior to that was in December, and had been about every 2 months prior to that."  Refilled.

## 2019-06-20 NOTE — Telephone Encounter (Signed)
Is this okay to refill? 

## 2019-06-29 ENCOUNTER — Encounter: Payer: Self-pay | Admitting: Family Medicine

## 2019-08-19 ENCOUNTER — Other Ambulatory Visit: Payer: Self-pay | Admitting: Family Medicine

## 2019-08-19 DIAGNOSIS — F411 Generalized anxiety disorder: Secondary | ICD-10-CM

## 2019-08-19 NOTE — Telephone Encounter (Signed)
[facility] is requesting to fill pt xanax please advise [facility]

## 2019-10-17 ENCOUNTER — Other Ambulatory Visit: Payer: Self-pay | Admitting: Family Medicine

## 2019-10-17 ENCOUNTER — Encounter: Payer: Self-pay | Admitting: Family Medicine

## 2019-10-17 DIAGNOSIS — F411 Generalized anxiety disorder: Secondary | ICD-10-CM

## 2019-10-17 NOTE — Telephone Encounter (Signed)
Is this okay to refill? 

## 2019-11-15 ENCOUNTER — Encounter: Payer: Self-pay | Admitting: Family Medicine

## 2020-01-27 ENCOUNTER — Other Ambulatory Visit: Payer: Self-pay | Admitting: Family Medicine

## 2020-01-27 DIAGNOSIS — F411 Generalized anxiety disorder: Secondary | ICD-10-CM

## 2020-03-28 ENCOUNTER — Encounter: Payer: Self-pay | Admitting: Family Medicine

## 2020-03-28 ENCOUNTER — Telehealth: Payer: Self-pay | Admitting: *Deleted

## 2020-03-28 DIAGNOSIS — Z5181 Encounter for therapeutic drug level monitoring: Secondary | ICD-10-CM

## 2020-03-28 DIAGNOSIS — I10 Essential (primary) hypertension: Secondary | ICD-10-CM

## 2020-03-28 DIAGNOSIS — R7301 Impaired fasting glucose: Secondary | ICD-10-CM

## 2020-03-28 DIAGNOSIS — E7800 Pure hypercholesterolemia, unspecified: Secondary | ICD-10-CM

## 2020-03-28 DIAGNOSIS — M109 Gout, unspecified: Secondary | ICD-10-CM

## 2020-03-28 NOTE — Telephone Encounter (Signed)
Patient scheduled med check for 04/23/20 and fasting labs for 04/19/20-need future orders please.

## 2020-04-11 ENCOUNTER — Encounter: Payer: 59 | Admitting: Family Medicine

## 2020-04-16 ENCOUNTER — Other Ambulatory Visit: Payer: Self-pay | Admitting: Family Medicine

## 2020-04-16 DIAGNOSIS — I10 Essential (primary) hypertension: Secondary | ICD-10-CM

## 2020-04-19 ENCOUNTER — Other Ambulatory Visit: Payer: Self-pay

## 2020-04-19 ENCOUNTER — Other Ambulatory Visit: Payer: 59

## 2020-04-19 DIAGNOSIS — Z5181 Encounter for therapeutic drug level monitoring: Secondary | ICD-10-CM

## 2020-04-19 DIAGNOSIS — R7301 Impaired fasting glucose: Secondary | ICD-10-CM

## 2020-04-19 DIAGNOSIS — I10 Essential (primary) hypertension: Secondary | ICD-10-CM

## 2020-04-19 DIAGNOSIS — E7800 Pure hypercholesterolemia, unspecified: Secondary | ICD-10-CM

## 2020-04-19 DIAGNOSIS — M109 Gout, unspecified: Secondary | ICD-10-CM

## 2020-04-20 LAB — URIC ACID: Uric Acid: 6.9 mg/dL (ref 3.8–8.4)

## 2020-04-20 LAB — COMPREHENSIVE METABOLIC PANEL
ALT: 66 IU/L — ABNORMAL HIGH (ref 0–44)
AST: 42 IU/L — ABNORMAL HIGH (ref 0–40)
Albumin/Globulin Ratio: 1.8 (ref 1.2–2.2)
Albumin: 4.6 g/dL (ref 3.8–4.8)
Alkaline Phosphatase: 91 IU/L (ref 48–121)
BUN/Creatinine Ratio: 17 (ref 10–24)
BUN: 19 mg/dL (ref 8–27)
Bilirubin Total: 0.8 mg/dL (ref 0.0–1.2)
CO2: 25 mmol/L (ref 20–29)
Calcium: 10.1 mg/dL (ref 8.6–10.2)
Chloride: 101 mmol/L (ref 96–106)
Creatinine, Ser: 1.11 mg/dL (ref 0.76–1.27)
GFR calc Af Amer: 80 mL/min/{1.73_m2} (ref >59)
GFR calc non Af Amer: 69 mL/min/{1.73_m2} (ref >59)
Globulin, Total: 2.6 g/dL (ref 1.5–4.5)
Glucose: 103 mg/dL — ABNORMAL HIGH (ref 65–99)
Potassium: 4.5 mmol/L (ref 3.5–5.2)
Sodium: 140 mmol/L (ref 134–144)
Total Protein: 7.2 g/dL (ref 6.0–8.5)

## 2020-04-20 LAB — LIPID PANEL
Chol/HDL Ratio: 3.7 ratio (ref 0.0–5.0)
Cholesterol, Total: 198 mg/dL (ref 100–199)
HDL: 54 mg/dL (ref >39)
LDL Chol Calc (NIH): 127 mg/dL — ABNORMAL HIGH (ref 0–99)
Triglycerides: 94 mg/dL (ref 0–149)
VLDL Cholesterol Cal: 17 mg/dL (ref 5–40)

## 2020-04-20 LAB — CBC WITH DIFFERENTIAL/PLATELET
Basophils Absolute: 0.1 10*3/uL (ref 0.0–0.2)
Basos: 1 %
EOS (ABSOLUTE): 0.1 10*3/uL (ref 0.0–0.4)
Eos: 1 %
Hematocrit: 48 % (ref 37.5–51.0)
Hemoglobin: 16.3 g/dL (ref 13.0–17.7)
Immature Grans (Abs): 0 10*3/uL (ref 0.0–0.1)
Immature Granulocytes: 0 %
Lymphocytes Absolute: 2.1 10*3/uL (ref 0.7–3.1)
Lymphs: 26 %
MCH: 32.1 pg (ref 26.6–33.0)
MCHC: 34 g/dL (ref 31.5–35.7)
MCV: 95 fL (ref 79–97)
Monocytes Absolute: 1.1 10*3/uL — ABNORMAL HIGH (ref 0.1–0.9)
Monocytes: 14 %
Neutrophils Absolute: 4.5 10*3/uL (ref 1.4–7.0)
Neutrophils: 58 %
Platelets: 267 10*3/uL (ref 150–450)
RBC: 5.07 x10E6/uL (ref 4.14–5.80)
RDW: 12.2 % (ref 11.6–15.4)
WBC: 7.9 10*3/uL (ref 3.4–10.8)

## 2020-04-20 LAB — HEMOGLOBIN A1C
Est. average glucose Bld gHb Est-mCnc: 117 mg/dL
Hgb A1c MFr Bld: 5.7 % — ABNORMAL HIGH (ref 4.8–5.6)

## 2020-04-22 NOTE — Progress Notes (Signed)
Chief Complaint  Patient presents with   Hypertension    nonfasting med check. No new concerns.    Patient presents for med check (he cancelled his Wellness Visit, and rescheduled it for August).  He had labs done prior to visit, see below.  At last visit a year ago, he reported cutting back on alcohol (having 2-3 glasses of wine only 2x/week), was on a good exercise regimen, felt less anxious and was sleeping better (helped by his personal trainer daughter who had stayed with them and got him "on track").  He had been working less, active playing tennis, pickleball, golfing (walking course). He reports that he was "doing great until 7-8 weeks ago." He took three 2-week vacations back to back, and admits to drinking a lot more alcohol, including lots of rum drinks. His last drink was when he returned, on Father's Day.  He hasn't had alcohol since.  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  Hypertension: He reports compliance with his medication regimen of Atenolol 25mg  andlisinopril 20mg . He takes them both first thing in the morning and denies side effects.  BP's have been running pretty good.  He brings in a list only from the last week, where morning BP's were 122/79, 119/74, 142/88 and evening BP's 121/74, 101/63.  He denies headaches, dizziness, chest pain, cough. Follows low sodium diet (though maybe not as well while eating out).  Impaired fasting glucose--he tries to follow a healthy diet, limiting carbs, sweets, sugar and getting regular exercise. However, he admits to worse diet related to vacations in the last 2 months as noted above.   GERD--much improved. Red meat and red wine together seems to cause reflux, and he uses Prilosec prior to eating that.  Took prilosec prior to eating at [facility] this past week, and did fine.  Only uses it preventatively, hasn't had any reflux.  Hyperlipidemia follow-up: Patient is reportedly  following a low-fat, low cholesterol diet. Even on recent vacation did fairly well--ate a lot of fish, but ate in more restaurants, probably not quite as good.  Compliant with medications (40mg  atorvastatin)and denies medication side effects.He had labs done prior to visit, see below.  Gout--denies any flares, even with recent change in diet and increased alcohol from vacations. Prior gout was foot/ankle/toes.Compliant with 100mg  of allopurinol without side effects.  Anxiety: He uses the alprazolam "every now and then" when his mind is worrying, thinking too much about things (getting on airplanes also). A prescription lasts 2-3 months. Last filled 01/30/20, and 10/18/19.  He uses 1/2 tablet at a time.  It does not cause sedation.  Doesn't know if he needs a refill.  Prostate cancer--under care of Dr. Karsten Ro. S/p radioactive seed implant, and has done very well.  He last saw Dr. Karsten Ro in 12/2019, PSA was 0.027. He was told to f/u with him prn only, and can have his PSA monitored with annual labs.  PMH, PSH, SH reviewed  Outpatient Encounter Medications as of 04/23/2020  Medication Sig   allopurinol (ZYLOPRIM) 100 MG tablet TAKE ONE TABLET BY MOUTH DAILY   ALPRAZolam (XANAX) 0.5 MG tablet TAKE 1/2 TO 1 TABLET BY MOUTH THREE TIMES A DAY AS NEEDED FOR ANXIETY   atenolol (TENORMIN) 25 MG tablet Take 1 tablet (25 mg total) by mouth daily.   atorvastatin (LIPITOR) 40 MG tablet Take 1 tablet (40 mg total) by mouth daily.   lisinopril (ZESTRIL) 20 MG tablet TAKE ONE TABLET BY  MOUTH DAILY   omeprazole (PRILOSEC) 20 MG capsule Take 20 mg by mouth as needed. (Patient not taking: Reported on 04/23/2020)   No facility-administered encounter medications on file as of 04/23/2020.   No Known Allergies  ROS: No fever, chills, headaches, dizziness, chest pain, shortness of breath, nausea, vomiting, bowel changes, abdominal pain, urinary complaints, bleeding, bruising, rash, depression, or other  complaints. He is sleeping well, no longer needing OTC Unisom.   PHYSICAL EXAM:  BP 128/82    Pulse 64    Ht 5\' 10"  (1.778 m)    Wt 183 lb (83 kg)    BMI 26.26 kg/m   Wt Readings from Last 3 Encounters:  04/23/20 183 lb (83 kg)  04/07/19 179 lb 12.8 oz (81.6 kg)  04/04/19 180 lb 3.2 oz (81.7 kg)    Well-appearing, pleasant male in no distress HEENT: conjunctiva and sclera are clear, EOMI. Wearing mask. Neck: No lymphadenopathy, thyromegaly or mass, no bruit Heart: regular rate and rhythm, no murmur rub or gallop Lungs: clear bilaterally, no wheezes, rales, ronchi Back: no spinal or CVA tenderness Abdomen: soft, nontender, no organomegaly or mass Extremities: no edema, normal pulses Psych: normal mood, affect, hygiene and grooming Skin: normal turgor, no rash/lesions Neuro: alert and oriented, normal gait, DTR's 2+ and symmetric  Lab Results  Component Value Date   CHOL 198 04/19/2020   HDL 54 04/19/2020   LDLCALC 127 (H) 04/19/2020   TRIG 94 04/19/2020   CHOLHDL 3.7 04/19/2020     Chemistry      Component Value Date/Time   NA 140 04/19/2020 0822   K 4.5 04/19/2020 0822   CL 101 04/19/2020 0822   CO2 25 04/19/2020 0822   BUN 19 04/19/2020 0822   CREATININE 1.11 04/19/2020 0822   CREATININE 1.06 03/26/2017 0740      Component Value Date/Time   CALCIUM 10.1 04/19/2020 0822   ALKPHOS 91 04/19/2020 0822   AST 42 (H) 04/19/2020 0822   ALT 66 (H) 04/19/2020 0822   BILITOT 0.8 04/19/2020 0822     Fasting glucose 103  Lab Results  Component Value Date   HGBA1C 5.7 (H) 04/19/2020   Lab Results  Component Value Date   LABURIC 6.9 04/19/2020   Lab Results  Component Value Date   WBC 7.9 04/19/2020   HGB 16.3 04/19/2020   HCT 48.0 04/19/2020   MCV 95 04/19/2020   PLT 267 04/19/2020    ASSESSMENT/PLAN:  Essential hypertension, benign - controlled; continue current regimen, regular exercise, low Na diet - Plan: atenolol (TENORMIN) 25 MG tablet, lisinopril  (ZESTRIL) 20 MG tablet  Impaired fasting glucose - higher due to recent dietary indiscretions with recent vacations. Proper diet and risks reviewed - Plan: Glucose, random  Elevated liver function tests - likely related to recent increase in alcohol.  He is now abstaining, and will recheck prior to his wellness visit in August - Plan: Hepatic function panel  Gastroesophageal reflux disease without esophagitis - well controlled with diet, and prn use of PPI prior to meals that trigger GERD  Gout of right foot, unspecified cause, unspecified chronicity - continue allopurinol and limiting alcohol - Plan: allopurinol (ZYLOPRIM) 100 MG tablet  Performance anxiety - beta blocker was prescribed both for HTN and anxiety (rather than short-acting propranolol prn only). BP well controlled, no longer having anxiety, cont atenolo - Plan: atenolol (TENORMIN) 25 MG tablet  Pure hypercholesterolemia - continue current regimen - Plan: atorvastatin (LIPITOR) 40 MG tablet  Need for pneumococcal  vaccination - pt elects to get pneumovax today rather than wait until August, in case plans change and visit needs to be virtual - Plan: Pneumococcal polysaccharide vaccine 23-valent greater than or equal to 2yo subcutaneous/IM   Repeat lipids and fasting glucose prior to wellness visit  Uric acid above goal; recent increase in alcohol and no flares, so will continue 100mg  allopurinol for now.  Plan to increase if he develops any sx of gout.  Will need PSA with yearly labs done in 2022 (no longer seeing Dr. Karsten Ro for prostate cancer).  F/u as scheduled in August for AWV, with labs prior.  35-40 min FTF visit today, with additional time spent in chart review and documentation. Counseled re: diet in detail, risks of alcohol, fatty liver, pre-diabetes. LDL higher than on last check, reviewed low cholesterol diet.

## 2020-04-23 ENCOUNTER — Encounter: Payer: Self-pay | Admitting: Family Medicine

## 2020-04-23 ENCOUNTER — Other Ambulatory Visit: Payer: Self-pay

## 2020-04-23 ENCOUNTER — Ambulatory Visit (INDEPENDENT_AMBULATORY_CARE_PROVIDER_SITE_OTHER): Payer: Medicare Other | Admitting: Family Medicine

## 2020-04-23 VITALS — BP 128/82 | HR 64 | Ht 70.0 in | Wt 183.0 lb

## 2020-04-23 DIAGNOSIS — R7301 Impaired fasting glucose: Secondary | ICD-10-CM

## 2020-04-23 DIAGNOSIS — Z23 Encounter for immunization: Secondary | ICD-10-CM | POA: Diagnosis not present

## 2020-04-23 DIAGNOSIS — M109 Gout, unspecified: Secondary | ICD-10-CM

## 2020-04-23 DIAGNOSIS — E7800 Pure hypercholesterolemia, unspecified: Secondary | ICD-10-CM

## 2020-04-23 DIAGNOSIS — R7989 Other specified abnormal findings of blood chemistry: Secondary | ICD-10-CM | POA: Diagnosis not present

## 2020-04-23 DIAGNOSIS — I10 Essential (primary) hypertension: Secondary | ICD-10-CM | POA: Diagnosis not present

## 2020-04-23 DIAGNOSIS — F418 Other specified anxiety disorders: Secondary | ICD-10-CM

## 2020-04-23 DIAGNOSIS — K219 Gastro-esophageal reflux disease without esophagitis: Secondary | ICD-10-CM

## 2020-04-23 MED ORDER — ALLOPURINOL 100 MG PO TABS: ORAL_TABLET | ORAL | 3 refills | Status: DC

## 2020-04-23 MED ORDER — ATORVASTATIN CALCIUM 40 MG PO TABS: 40.0000 mg | ORAL_TABLET | Freq: Every day | ORAL | 3 refills | Status: DC

## 2020-04-23 MED ORDER — LISINOPRIL 20 MG PO TABS: 20.0000 mg | ORAL_TABLET | Freq: Every day | ORAL | 3 refills | Status: DC

## 2020-04-23 MED ORDER — ATENOLOL 25 MG PO TABS: 25.0000 mg | ORAL_TABLET | Freq: Every day | ORAL | 3 refills | Status: DC

## 2020-04-23 NOTE — Patient Instructions (Signed)
You know what to do-- Continue to limit alcohol, eat healthy, continue regular exercise, and I'm sure your numbers will improve. In the future, try and exercise some moderation on your trips and vacations.

## 2020-05-01 ENCOUNTER — Other Ambulatory Visit: Payer: Self-pay | Admitting: Family Medicine

## 2020-05-01 DIAGNOSIS — F411 Generalized anxiety disorder: Secondary | ICD-10-CM

## 2020-05-01 NOTE — Telephone Encounter (Signed)
Is this okay to refill? 

## 2020-06-18 ENCOUNTER — Other Ambulatory Visit: Payer: Medicare Other

## 2020-06-20 ENCOUNTER — Ambulatory Visit: Payer: Medicare Other | Admitting: Family Medicine

## 2020-07-09 ENCOUNTER — Other Ambulatory Visit: Payer: Self-pay | Admitting: Family Medicine

## 2020-07-09 DIAGNOSIS — F411 Generalized anxiety disorder: Secondary | ICD-10-CM

## 2020-07-09 NOTE — Telephone Encounter (Signed)
Is this okay to refill? 

## 2020-10-10 ENCOUNTER — Other Ambulatory Visit: Payer: Self-pay | Admitting: Family Medicine

## 2020-10-10 DIAGNOSIS — F411 Generalized anxiety disorder: Secondary | ICD-10-CM

## 2020-10-10 NOTE — Telephone Encounter (Signed)
Called patient, he scheduled fasting labs for 10/24/20-did you want to add anything? Also scheduled AWV for 01/16/21.

## 2020-10-10 NOTE — Telephone Encounter (Signed)
He is past due for visit.  He cancelled last visit/labs.  Also, unless I'm missing something, hasn't had AWV in a long time.  Needs these scheduled.

## 2020-10-10 NOTE — Telephone Encounter (Signed)
Is this okay to refill? 

## 2020-10-24 ENCOUNTER — Other Ambulatory Visit: Payer: Medicare Other

## 2020-10-31 ENCOUNTER — Other Ambulatory Visit: Payer: Medicare Other

## 2021-01-02 ENCOUNTER — Other Ambulatory Visit: Payer: Self-pay | Admitting: Family Medicine

## 2021-01-02 DIAGNOSIS — F411 Generalized anxiety disorder: Secondary | ICD-10-CM

## 2021-01-02 NOTE — Telephone Encounter (Signed)
He has appt for AWV in 2 weeks (3/23). I sent in the refill (since not filled since mid-December). He does need to know that if he cancels this appointment, he will not be getting any more refills until seen.  (had Welcome to Medicare physical in 03/2019; had med check 03/2020)

## 2021-01-02 NOTE — Telephone Encounter (Signed)
Message left for patient

## 2021-01-02 NOTE — Telephone Encounter (Signed)
Is this okay to refill? 

## 2021-01-10 ENCOUNTER — Encounter: Payer: Self-pay | Admitting: Family Medicine

## 2021-01-10 DIAGNOSIS — R7301 Impaired fasting glucose: Secondary | ICD-10-CM

## 2021-01-10 DIAGNOSIS — M109 Gout, unspecified: Secondary | ICD-10-CM

## 2021-01-10 DIAGNOSIS — Z8546 Personal history of malignant neoplasm of prostate: Secondary | ICD-10-CM

## 2021-01-10 DIAGNOSIS — R7989 Other specified abnormal findings of blood chemistry: Secondary | ICD-10-CM

## 2021-01-10 DIAGNOSIS — E7800 Pure hypercholesterolemia, unspecified: Secondary | ICD-10-CM

## 2021-01-10 DIAGNOSIS — Z5181 Encounter for therapeutic drug level monitoring: Secondary | ICD-10-CM

## 2021-01-11 NOTE — Addendum Note (Signed)
Addended byRita Ohara on: 01/11/2021 01:09 PM   Modules accepted: Orders

## 2021-01-14 ENCOUNTER — Other Ambulatory Visit: Payer: Self-pay

## 2021-01-14 ENCOUNTER — Other Ambulatory Visit: Payer: Medicare Other

## 2021-01-14 DIAGNOSIS — E7800 Pure hypercholesterolemia, unspecified: Secondary | ICD-10-CM

## 2021-01-14 DIAGNOSIS — Z5181 Encounter for therapeutic drug level monitoring: Secondary | ICD-10-CM

## 2021-01-14 DIAGNOSIS — Z8546 Personal history of malignant neoplasm of prostate: Secondary | ICD-10-CM

## 2021-01-14 DIAGNOSIS — R7989 Other specified abnormal findings of blood chemistry: Secondary | ICD-10-CM

## 2021-01-14 DIAGNOSIS — M109 Gout, unspecified: Secondary | ICD-10-CM

## 2021-01-14 DIAGNOSIS — R7301 Impaired fasting glucose: Secondary | ICD-10-CM

## 2021-01-15 LAB — CBC WITH DIFFERENTIAL/PLATELET
Basophils Absolute: 0.1 10*3/uL (ref 0.0–0.2)
Basos: 2 %
EOS (ABSOLUTE): 0.2 10*3/uL (ref 0.0–0.4)
Eos: 3 %
Hematocrit: 48.7 % (ref 37.5–51.0)
Hemoglobin: 16.6 g/dL (ref 13.0–17.7)
Immature Grans (Abs): 0 10*3/uL (ref 0.0–0.1)
Immature Granulocytes: 0 %
Lymphocytes Absolute: 2.1 10*3/uL (ref 0.7–3.1)
Lymphs: 27 %
MCH: 31.6 pg (ref 26.6–33.0)
MCHC: 34.1 g/dL (ref 31.5–35.7)
MCV: 93 fL (ref 79–97)
Monocytes Absolute: 1 10*3/uL — ABNORMAL HIGH (ref 0.1–0.9)
Monocytes: 13 %
Neutrophils Absolute: 4.3 10*3/uL (ref 1.4–7.0)
Neutrophils: 55 %
Platelets: 253 10*3/uL (ref 150–450)
RBC: 5.26 x10E6/uL (ref 4.14–5.80)
RDW: 12 % (ref 11.6–15.4)
WBC: 7.7 10*3/uL (ref 3.4–10.8)

## 2021-01-15 LAB — COMPREHENSIVE METABOLIC PANEL
ALT: 48 IU/L — ABNORMAL HIGH (ref 0–44)
AST: 32 IU/L (ref 0–40)
Albumin/Globulin Ratio: 2.5 — ABNORMAL HIGH (ref 1.2–2.2)
Albumin: 4.7 g/dL (ref 3.8–4.8)
Alkaline Phosphatase: 95 IU/L (ref 44–121)
BUN/Creatinine Ratio: 15 (ref 10–24)
BUN: 16 mg/dL (ref 8–27)
Bilirubin Total: 1.1 mg/dL (ref 0.0–1.2)
CO2: 22 mmol/L (ref 20–29)
Calcium: 10.1 mg/dL (ref 8.6–10.2)
Chloride: 99 mmol/L (ref 96–106)
Creatinine, Ser: 1.08 mg/dL (ref 0.76–1.27)
Globulin, Total: 1.9 g/dL (ref 1.5–4.5)
Glucose: 107 mg/dL — ABNORMAL HIGH (ref 65–99)
Potassium: 4.5 mmol/L (ref 3.5–5.2)
Sodium: 138 mmol/L (ref 134–144)
Total Protein: 6.6 g/dL (ref 6.0–8.5)
eGFR: 75 mL/min/{1.73_m2} (ref >59)

## 2021-01-15 LAB — LIPID PANEL
Chol/HDL Ratio: 4.5 ratio (ref 0.0–5.0)
Cholesterol, Total: 215 mg/dL — ABNORMAL HIGH (ref 100–199)
HDL: 48 mg/dL (ref >39)
LDL Chol Calc (NIH): 140 mg/dL — ABNORMAL HIGH (ref 0–99)
Triglycerides: 149 mg/dL (ref 0–149)
VLDL Cholesterol Cal: 27 mg/dL (ref 5–40)

## 2021-01-15 LAB — URIC ACID: Uric Acid: 6.4 mg/dL (ref 3.8–8.4)

## 2021-01-15 LAB — HEMOGLOBIN A1C
Est. average glucose Bld gHb Est-mCnc: 120 mg/dL
Hgb A1c MFr Bld: 5.8 % — ABNORMAL HIGH (ref 4.8–5.6)

## 2021-01-15 LAB — PSA: Prostate Specific Ag, Serum: <0.1 ng/mL (ref 0.0–4.0)

## 2021-01-15 NOTE — Patient Instructions (Addendum)
HEALTH MAINTENANCE RECOMMENDATIONS:  It is recommended that you get at least 30 minutes of aerobic exercise at least 5 days/week (for weight loss, you may need as much as 60-90 minutes). This can be any activity that gets your heart rate up. This can be divided in 10-15 minute intervals if needed, but try and build up your endurance at least once a week.  Weight bearing exercise is also recommended twice weekly.  Eat a healthy diet with lots of vegetables, fruits and fiber.  "Colorful" foods have a lot of vitamins (ie green vegetables, tomatoes, red peppers, etc).  Limit sweet tea, regular sodas and alcoholic beverages, all of which has a lot of calories and sugar.  Up to 2 alcoholic drinks daily may be beneficial for men (unless trying to lose weight, watch sugars).  Drink a lot of water.  Sunscreen of at least SPF 30 should be used on all sun-exposed parts of the skin when outside between the hours of 10 am and 4 pm (not just when at beach or pool, but even with exercise, golf, tennis, and yard work!)  Use a sunscreen that says "broad spectrum" so it covers both UVA and UVB rays, and make sure to reapply every 1-2 hours.  Remember to change the batteries in your smoke detectors when changing your clock times in the spring and fall.  Carbon monoxide detectors are recommended for your home.  Use your seat belt every time you are in a car, and please drive safely and not be distracted with cell phones and texting while driving.    Jeremy Bush , Thank you for taking time to come for your Medicare Wellness Visit. I appreciate your ongoing commitment to your health goals. Please review the following plan we discussed and let me know if I can assist you in the future.    This is a list of the screening recommended for you and due dates:  Health Maintenance  Topic Date Due  . COVID-19 Vaccine (3 - Pfizer risk 4-dose series) 01/19/2020  . Flu Shot  05/27/2020  . Tetanus Vaccine  01/14/2023  .  Colon Cancer Screening  11/06/2027  .  Hepatitis C: One time screening is recommended by Center for Disease Control  (CDC) for  adults born from [birth year] through 1967.   Completed  . Pneumonia vaccines  Completed  . HPV Vaccine  Aged Out   We entered your 3rd dose of COVID vaccine today, so ignore date above. Pay attention to the news regarding further booster recommendations.  I recommend getting the new shingles vaccine (Shingrix). Since you have Medicare, you will need to get this from the pharmacy, as it is covered by Part D. This is a series of 2 injections, spaced 2 months apart.  It doesn't have to be exactly 2 months apart (but can't be under 2 months), if that isn't feasible for your schedule, but try and get them close to 2 months (and definitely within 6 months of each other, or else the efficacy of the vaccine drops off).  Please return notarized copies of your living will and healthcare power of attorney so these can be scanned into your chart.  Please work on cutting back on the egg yolks and red meat in the diet to try and get the LDL down. Please re-introduce cardio to your workout routine.  150 minutes/week at a minimum.  This should help raise the HDL cholesterol and improve the ratio.  Cutting back on sugar in the  diet--carbs and the sugar in your alcoholic beverages will help keep the sugar down (as well as increased exercise).  You are pre-diabetic. I encourage you to cut back significantly on alcohol--due to liver tests being up, higher sugars, and the fact that it likely isn't as beneficial (if at all) than we used to think.   Prediabetes Eating Plan Prediabetes is a condition that causes blood sugar (glucose) levels to be higher than normal. This increases the risk for developing type 2 diabetes (type 2 diabetes mellitus). Working with a health care provider or nutrition specialist (dietitian) to make diet and lifestyle changes can help prevent the onset of diabetes. These  changes may help you:  Control your blood glucose levels.  Improve your cholesterol levels.  Manage your blood pressure. What are tips for following this plan? Reading food labels  Read food labels to check the amount of fat, salt (sodium), and sugar in prepackaged foods. Avoid foods that have: ? Saturated fats. ? Trans fats. ? Added sugars.  Avoid foods that have more than 300 milligrams (mg) of sodium per serving. Limit your sodium intake to less than 2,300 mg each day. Shopping  Avoid buying pre-made and processed foods.  Avoid buying drinks with added sugar. Cooking  Cook with olive oil. Do not use butter, lard, or ghee.  Bake, broil, grill, steam, or boil foods. Avoid frying. Meal planning  Work with your dietitian to create an eating plan that is right for you. This may include tracking how many calories you take in each day. Use a food diary, notebook, or mobile application to track what you eat at each meal.  Consider following a Mediterranean diet. This includes: ? Eating several servings of fresh fruits and vegetables each day. ? Eating fish at least twice a week. ? Eating one serving each day of whole grains, beans, nuts, and seeds. ? Using olive oil instead of other fats. ? Limiting alcohol. ? Limiting red meat. ? Using nonfat or low-fat dairy products.  Consider following a plant-based diet. This includes dietary choices that focus on eating mostly vegetables and fruit, grains, beans, nuts, and seeds.  If you have high blood pressure, you may need to limit your sodium intake or follow a diet such as the DASH (Dietary Approaches to Stop Hypertension) eating plan. The DASH diet aims to lower high blood pressure.   Lifestyle  Set weight loss goals with help from your health care team. It is recommended that most people with prediabetes lose 7% of their body weight.  Exercise for at least 30 minutes 5 or more days a week.  Attend a support group or seek  support from a mental health counselor.  Take over-the-counter and prescription medicines only as told by your health care provider. What foods are recommended? Fruits Berries. Bananas. Apples. Oranges. Grapes. Papaya. Mango. Pomegranate. Kiwi. Grapefruit. Cherries. Vegetables Lettuce. Spinach. Peas. Beets. Cauliflower. Cabbage. Broccoli. Carrots. Tomatoes. Squash. Eggplant. Herbs. Peppers. Onions. Cucumbers. Brussels sprouts. Grains Whole grains, such as whole-wheat or whole-grain breads, crackers, cereals, and pasta. Unsweetened oatmeal. Bulgur. Barley. Quinoa. Brown rice. Corn or whole-wheat flour tortillas or taco shells. Meats and other proteins Seafood. Poultry without skin. Lean cuts of pork and beef. Tofu. Eggs. Nuts. Beans. Dairy Low-fat or fat-free dairy products, such as yogurt, cottage cheese, and cheese. Beverages Water. Tea. Coffee. Sugar-free or diet soda. Seltzer water. Low-fat or nonfat milk. Milk alternatives, such as soy or almond milk. Fats and oils Olive oil. Canola oil. Sunflower oil.  Grapeseed oil. Avocado. Walnuts. Sweets and desserts Sugar-free or low-fat pudding. Sugar-free or low-fat ice cream and other frozen treats. Seasonings and condiments Herbs. Sodium-free spices. Mustard. Relish. Low-salt, low-sugar ketchup. Low-salt, low-sugar barbecue sauce. Low-fat or fat-free mayonnaise. The items listed above may not be a complete list of recommended foods and beverages. Contact a dietitian for more information. What foods are not recommended? Fruits Fruits canned with syrup. Vegetables Canned vegetables. Frozen vegetables with butter or cream sauce. Grains Refined white flour and flour products, such as bread, pasta, snack foods, and cereals. Meats and other proteins Fatty cuts of meat. Poultry with skin. Breaded or fried meat. Processed meats. Dairy Full-fat yogurt, cheese, or milk. Beverages Sweetened drinks, such as iced tea and soda. Fats and  oils Butter. Lard. Ghee. Sweets and desserts Baked goods, such as cake, cupcakes, pastries, cookies, and cheesecake. Seasonings and condiments Spice mixes with added salt. Ketchup. Barbecue sauce. Mayonnaise. The items listed above may not be a complete list of foods and beverages that are not recommended. Contact a dietitian for more information. Where to find more information  American Diabetes Association: www.diabetes.org Summary  You may need to make diet and lifestyle changes to help prevent the onset of diabetes. These changes can help you control blood sugar, improve cholesterol levels, and manage blood pressure.  Set weight loss goals with help from your health care team. It is recommended that most people with prediabetes lose 7% of their body weight.  Consider following a Mediterranean diet. This includes eating plenty of fresh fruits and vegetables, whole grains, beans, nuts, seeds, fish, and low-fat dairy, and using olive oil instead of other fats. This information is not intended to replace advice given to you by your health care provider. Make sure you discuss any questions you have with your health care provider. Document Revised: 01/12/2020 Document Reviewed: 01/12/2020 Elsevier Patient Education  Jeremy Bush.  Cholesterol Content in Foods Cholesterol is a waxy, fat-like substance that helps to carry fat in the blood. The body needs cholesterol in small amounts, but too much cholesterol can cause damage to the arteries and heart. Most people should eat less than 200 milligrams (mg) of cholesterol a day. Foods with cholesterol Cholesterol is found in animal-based foods, such as meat, seafood, and dairy. Generally, low-fat dairy and lean meats have less cholesterol than full-fat dairy and fatty meats. The milligrams of cholesterol per serving (mg per serving) of common cholesterol-containing foods are listed below. Meat and other proteins  Egg -- one large whole egg  has 186 mg.  Veal shank -- 4 oz has 141 mg.  Lean ground [place] (93% lean) -- 4 oz has 118 mg.  Fat-trimmed lamb loin -- 4 oz has 106 mg.  Lean ground beef (90% lean) -- 4 oz has 100 mg.  Lobster -- 3.5 oz has 90 mg.  Pork loin chops -- 4 oz has 86 mg.  Canned salmon -- 3.5 oz has 83 mg.  Fat-trimmed beef top loin -- 4 oz has 78 mg.  Frankfurter -- 1 frank (3.5 oz) has 77 mg.  Crab -- 3.5 oz has 71 mg.  Roasted chicken without skin, white meat -- 4 oz has 66 mg.  Light bologna -- 2 oz has 45 mg.  Deli-cut [place] -- 2 oz has 31 mg.  Canned tuna -- 3.5 oz has 31 mg.  Berniece Salines -- 1 oz has 29 mg.  Oysters and mussels (raw) -- 3.5 oz has 25 mg.  Mackerel -- 1  oz has 22 mg.  Trout -- 1 oz has 20 mg.  Pork sausage -- 1 link (1 oz) has 17 mg.  Salmon -- 1 oz has 16 mg.  Tilapia -- 1 oz has 14 mg. Dairy  Soft-serve ice cream --  cup (4 oz) has 103 mg.  Whole-milk yogurt -- 1 cup (8 oz) has 29 mg.  Cheddar cheese -- 1 oz has 28 mg.  American cheese -- 1 oz has 28 mg.  Whole milk -- 1 cup (8 oz) has 23 mg.  2% milk -- 1 cup (8 oz) has 18 mg.  Cream cheese -- 1 tablespoon (Tbsp) has 15 mg.  Cottage cheese --  cup (4 oz) has 14 mg.  Low-fat (1%) milk -- 1 cup (8 oz) has 10 mg.  Sour cream -- 1 Tbsp has 8.5 mg.  Low-fat yogurt -- 1 cup (8 oz) has 8 mg.  Nonfat Greek yogurt -- 1 cup (8 oz) has 7 mg.  Half-and-half cream -- 1 Tbsp has 5 mg. Fats and oils  Cod liver oil -- 1 tablespoon (Tbsp) has 82 mg.  Butter -- 1 Tbsp has 15 mg.  Lard -- 1 Tbsp has 14 mg.  Bacon grease -- 1 Tbsp has 14 mg.  Mayonnaise -- 1 Tbsp has 5-10 mg.  Margarine -- 1 Tbsp has 3-10 mg. Exact amounts of cholesterol in these foods may vary depending on specific ingredients and brands.   Foods without cholesterol Most plant-based foods do not have cholesterol unless you combine them with a food that has cholesterol. Foods without cholesterol include:  Grains and  cereals.  Vegetables.  Fruits.  Vegetable oils, such as olive, canola, and sunflower oil.  Legumes, such as peas, beans, and lentils.  Nuts and seeds.  Egg whites.   Summary  The body needs cholesterol in small amounts, but too much cholesterol can cause damage to the arteries and heart.  Most people should eat less than 200 milligrams (mg) of cholesterol a day. This information is not intended to replace advice given to you by your health care provider. Make sure you discuss any questions you have with your health care provider. Document Revised: 03/05/2020 Document Reviewed: 03/05/2020 Elsevier Patient Education  [place].

## 2021-01-15 NOTE — Progress Notes (Signed)
Chief Complaint  Patient presents with   Medicare Wellness    Nonfasting AWV. Has a pain x 1 month left side between neck and shoulder-thinks it may be from lifting weights. Hot water in the shower makes it feel better, has not been doing anything for it otherwise. Has not gotten shingrix.    Jeremy Bush is a 65 y.o. male who presents for Medicare Annual Wellness visit and follow-up on chronic medical conditions.  He had labs done prior to visit, see below.  Hypertension: He reports compliance with his medication regimen of Atenolol 25mg  andlisinopril20mg . He takes them both first thing in the morning and denies side effects.   BP's are running 120-140//80-90 (max was 149/90), higher in the mornings, lower in the evenings.  130/87 today, prior to coming. He denies headaches, dizziness, chest pain, cough. Follows low sodium diet (not quite as good lately, as he has been going out to dinner more), very good when home.  Impaired fasting glucose--he tries to follow a healthy diet, limiting carbs, sweets, sugar and getting regular exercise. Avoiding carbs at home, but will eat them in the restaurants.   Rare desserts. Alcohol--2/day. Recently cut back during the week, but making up for it on the weekends.  GERD--much improved. Hasn't need to use any Prilosec in a while. A month ago he had very slight symptoms immediately after red wine and steak--took 2 prilosec immediately, and helped. Red meat and red wine together seems to cause reflux, and he used to remember to use Prilosec prior to eating that.   Hyperlipidemia follow-up: Patient is reportedly following a low-fat, low cholesterol diet.  Compliant with medications (40mg  atorvastatin)and denies medication side effects.He had labs done prior to visit, see below.Denies missed doses of medication.  Eating steak once a week at most.  Eating a lot more eggs, 4-5/week, some bacon (only when going out).  Gout--He denies any gout  flares. Prior gout was foot/ankle/toes.Compliant with 100mg  of allopurinol without side effects.  Anxiety: He uses the alprazolam as needed, when his mind is worrying, thinking too much about things, and also used with flying.  Takes 1/2 tablet at a time. Some weeks needs it a few days in a row, others, not much. A prescription lasts 2-3 months. Last filled 01/02/21 #30, prior fill was 10/10/20.  Prostate cancer--Previously monitored by Dr. Karsten Ro. S/p radioactive seed implant, and has done very well.  He last saw Dr. Karsten Ro in 12/2019, PSA was 0.027. He was told to f/u with him prn only, and can have his PSA monitored with annual labs (see below).   Immunization History  Administered Date(s) Administered   Influenza Split 07/22/2011, 07/29/2012   Influenza,inj,Quad PF,6+ Mos 07/28/2013, 08/02/2014, 11/01/2015, 06/25/2016, 09/15/2017, 08/04/2018   Influenza-Unspecified 09/14/2019   PFIZER(Purple Top)SARS-COV-2 Vaccination 12/01/2019, 12/22/2019, 08/01/2020   Pneumococcal Conjugate-13 04/07/2019   Pneumococcal Polysaccharide-23 04/23/2020   Td 09/15/2005   Tdap 01/13/2013   Zoster 08/02/2014   Reports she got flu shot when he got COVID booster, from [place]. Last colonoscopy: 10/2017 hyperplastic polyp and hemorrhoids.  (h/o adenomatous polyps).  Dr. Michail Sermon (pt told him 10 year f/u) Last PSA: with recent labs (prior to that had been monitored by Dr. Karsten Ro, was 0.027 in 12/2019.) Lab Results  Component Value Date   PSA1 <0.1 01/14/2021   PSA 4.52 (H) 01/12/2012   PSA 3.66 07/11/2011   PSA 3.51 05/21/2011   Dentist: 4 times yearly (for cleanings) Ophtho: yearly Exercise:  Works with Physiological scientist 1 hours 3x/week,  80% strength training, 20% conditioning (core work and cardio).  Walking an hour daily (with dog, frequent stops sometimes). Not playing much tennis/pickleball (due to a knee injury with jumproping exercise with trainre).  Other doctors caring for patient  include: GI: Dr. Michail Sermon Urologist: Dr. Karsten Ro Dentist: Dr. Christie Nottingham Ophtho: [facility], Dr. Valetta Close   Depression screen:  Negative Fall screen: negative Functional status survey: unremarkable, perhaps slight decline in hearing. Mini-Cog screen: normal See full screens in Epic.   End of Life Discussion:  Patient does not have a living will and medical power of attorney. Paperwork given last year.   PMH, PSH, SH and FH were reviewed and updated.  Outpatient Encounter Medications as of 01/16/2021  Medication Sig   allopurinol (ZYLOPRIM) 100 MG tablet TAKE ONE TABLET BY MOUTH DAILY   ALPRAZolam (XANAX) 0.5 MG tablet TAKE 1/2 TO 1 TABLET BY MOUTH THREE TIMES A DAY AS NEEDED FOR ANXIETY   atenolol (TENORMIN) 25 MG tablet Take 1 tablet (25 mg total) by mouth daily.   atorvastatin (LIPITOR) 40 MG tablet Take 1 tablet (40 mg total) by mouth daily.   lisinopril (ZESTRIL) 20 MG tablet Take 1 tablet (20 mg total) by mouth daily.   omeprazole (PRILOSEC) 20 MG capsule Take 20 mg by mouth as needed. (Patient not taking: No sig reported)   No facility-administered encounter medications on file as of 01/16/2021.    ROS: The patient denies anorexia, fever, headaches, vision loss, decreased hearing, ear pain, hoarseness, chest pain, palpitations, dizziness, syncope, dyspnea on exertion, cough, swelling, nausea, vomiting, diarrhea, constipation, abdominal pain, melena, hematochezia, hematuria, incontinence, erectile dysfunction, nocturia, weakened urine stream, dysuria, genital lesions, joint pains, numbness, tingling, weakness, tremor, suspicious skin lesions, depression, abnormal bleeding/bruising, or enlarged lymph nodes.  Anxiety per HPI, improved.   PHYSICAL EXAM:  BP 122/80    Pulse 60    Ht 5\' 10"  (1.778 m)    Wt 188 lb (85.3 kg)    BMI 26.98 kg/m   Wt Readings from Last 3 Encounters:  01/16/21 188 lb (85.3 kg)  04/23/20 183 lb (83 kg)  04/07/19 179 lb 12.8 oz (81.6 kg)    General  Appearance:  Alert, cooperative, no distress, appears stated age   Head:  Normocephalic, without obvious abnormality, atraumatic   Eyes:  PERRL, conjunctiva/corneas clear, EOM's intact, fundi benign   Ears:  Normal TM's and external ear canals   Nose:  Not examined (wearing mask due to COVID-19, no related complaints)  Throat:  Not examined (wearing mask due to COVID-19, no related complaints)  Neck:  Supple, no lymphadenopathy; thyroid: no enlargement/ tenderness/nodules; no carotid bruit or JVD   Back:  Spine nontender, no curvature, ROM normal, no CVA tenderness   Lungs:  Clear to auscultation bilaterally without wheezes, rales or ronchi; respirations unlabored   Chest Wall:  No tenderness or deformity   Heart:  Regular rate and rhythm, S1 and S2 normal, no murmur, rub or gallop   Breast Exam:  No chest wall tenderness, masses or gynecomastia   Abdomen:  Soft, non-tender, nondistended, normoactive bowel sounds, no masses, no hepatosplenomegaly   Genitalia:  Normal, no lesions. Testicles descended, normal, no masses. No inguinal hernias  Rectal:  Normal sphincter tone, no masses.  Prostate is smooth, mildly enlarged, no nodules.  Heme negative stool  Extremities:  No clubbing, cyanosis or edema   Pulses:  2+ and symmetric all extremities   Skin:  Skin color, texture, turgor normal. No suspicious lesions  Lymph  nodes:  Cervical, supraclavicular, and axillary nodes normal   Neurologic:  Normal strength, sensation and gait; reflexes 2+ and symmetric throughout                         Psych:  Normal mood, affect, hygiene and grooming   Lab Results  Component Value Date   HGBA1C 5.8 (H) 01/14/2021   Fasting glucose 107    Chemistry      Component Value Date/Time   NA 138 01/14/2021 0838   K 4.5 01/14/2021 0838   CL 99 01/14/2021 0838   CO2 22 01/14/2021 0838   BUN 16 01/14/2021 0838   CREATININE 1.08 01/14/2021 0838   CREATININE 1.06 03/26/2017  0740      Component Value Date/Time   CALCIUM 10.1 01/14/2021 0838   ALKPHOS 95 01/14/2021 0838   AST 32 01/14/2021 0838   ALT 48 (H) 01/14/2021 0838   BILITOT 1.1 01/14/2021 0838     Lab Results  Component Value Date   LABURIC 6.4 01/14/2021    Lab Results  Component Value Date   CHOL 215 (H) 01/14/2021   HDL 48 01/14/2021   LDLCALC 140 (H) 01/14/2021   TRIG 149 01/14/2021   CHOLHDL 4.5 01/14/2021   Lab Results  Component Value Date   WBC 7.7 01/14/2021   HGB 16.6 01/14/2021   HCT 48.7 01/14/2021   MCV 93 01/14/2021   PLT 253 01/14/2021   Lab Results  Component Value Date   PSA1 <0.1 01/14/2021   PSA 4.52 (H) 01/12/2012   PSA 3.66 07/11/2011   PSA 3.51 05/21/2011     ASSESSMENT/PLAN:  Medicare annual wellness visit, initial  Personal history of prostate cancer - PSA remains low. Cont to monitor yearly  Impaired fasting glucose - Reviewed proper diet, regular exercise  Elevated liver function tests - limit alcohol. Cont to monitor. risks of alcohol reviewed  Gout of right foot, unspecified cause, unspecified chronicity - uric acid level still >6, but no gout flares. Cont allopurinol  Essential hypertension, benign - controlled on current regimen, occ higher values. Reviewed low Na diet  Anxiety - Uses alprazolam prn. Encouraged to use sparingly  Pure hypercholesterolemia - LDL higher, above goal. Reviewed low cholesterol diet. He prefers to work on diet and recheck in 75mos rather than increase statin dose  Fasting lab visit 3 months--lfts, glu, a1c, lipids    Recommended at least 30 minutes of aerobic activity at least 5 days/week, weight-bearing exercise at least 2x/week; proper sunscreen use reviewed; healthy diet and alcohol recommendations (less than or equal to 2 drinks/day) reviewed; regular seatbelt use; changing batteries in smoke detectors. Immunization recommendations discussed--continue yearly flu shots (high dose), Shingrix recommended,  risks/SE reviewed, to get from pharmacy. Colonoscopy is UTD.  MOST form reviewed, filled out.  Full Code, Full Care He was again given paperwork for Living Will, Healthcare POA , discussed importance and reason for having these. He was asked to get [place] copies when completed.   Please work on cutting back on the egg yolks and red meat in the diet to try and get the LDL down. Please re-introduce cardio to your workout routine.  150 minutes/week at a minimum.  This should help raise the HDL cholesterol and improve the ratio.  Cutting back on sugar in the diet--carbs and the sugar in your alcoholic beverages will help keep the sugar down (as well as increased exercise).  You are pre-diabetic. I encourage you to cut  back significantly on alcohol--due to liver tests being up, higher sugars, and the fact that it likely isn't as beneficial (if at all) than we used to think.   Medicare Attestation I have personally reviewed: The patient's medical and social history Their use of alcohol, tobacco or illicit drugs Their current medications and supplements The patient's functional ability including ADLs,fall risks, home safety risks, cognitive, and hearing and visual impairment Diet and physical activities Evidence for depression or mood disorders  The patient's weight, height, and BMI have been recorded in the chart.  I have made referrals, counseling, and provided education to the patient based on review of the above and I have provided the patient with a written personalized care plan for preventive services.     Vikki Ports, MD

## 2021-01-16 ENCOUNTER — Other Ambulatory Visit: Payer: Self-pay

## 2021-01-16 ENCOUNTER — Encounter: Payer: Self-pay | Admitting: Family Medicine

## 2021-01-16 ENCOUNTER — Ambulatory Visit (INDEPENDENT_AMBULATORY_CARE_PROVIDER_SITE_OTHER): Payer: Medicare Other | Admitting: Family Medicine

## 2021-01-16 VITALS — BP 122/80 | HR 60 | Ht 70.0 in | Wt 188.0 lb

## 2021-01-16 DIAGNOSIS — R7989 Other specified abnormal findings of blood chemistry: Secondary | ICD-10-CM | POA: Diagnosis not present

## 2021-01-16 DIAGNOSIS — M109 Gout, unspecified: Secondary | ICD-10-CM

## 2021-01-16 DIAGNOSIS — R7301 Impaired fasting glucose: Secondary | ICD-10-CM

## 2021-01-16 DIAGNOSIS — Z0000 Encounter for general adult medical examination without abnormal findings: Secondary | ICD-10-CM | POA: Diagnosis not present

## 2021-01-16 DIAGNOSIS — Z8546 Personal history of malignant neoplasm of prostate: Secondary | ICD-10-CM

## 2021-01-16 DIAGNOSIS — I10 Essential (primary) hypertension: Secondary | ICD-10-CM

## 2021-01-16 DIAGNOSIS — E7800 Pure hypercholesterolemia, unspecified: Secondary | ICD-10-CM

## 2021-01-16 DIAGNOSIS — F419 Anxiety disorder, unspecified: Secondary | ICD-10-CM

## 2021-01-17 ENCOUNTER — Encounter: Payer: Self-pay | Admitting: *Deleted

## 2021-04-03 ENCOUNTER — Other Ambulatory Visit: Payer: Self-pay | Admitting: Family Medicine

## 2021-04-03 DIAGNOSIS — F411 Generalized anxiety disorder: Secondary | ICD-10-CM

## 2021-04-03 NOTE — Telephone Encounter (Signed)
Is this okay to refill? 

## 2021-04-18 ENCOUNTER — Other Ambulatory Visit: Payer: Medicare Other

## 2021-04-18 ENCOUNTER — Other Ambulatory Visit: Payer: Self-pay

## 2021-04-18 DIAGNOSIS — R7301 Impaired fasting glucose: Secondary | ICD-10-CM

## 2021-04-18 DIAGNOSIS — R7989 Other specified abnormal findings of blood chemistry: Secondary | ICD-10-CM

## 2021-04-18 DIAGNOSIS — E7800 Pure hypercholesterolemia, unspecified: Secondary | ICD-10-CM

## 2021-04-19 ENCOUNTER — Encounter: Payer: Self-pay | Admitting: Family Medicine

## 2021-04-19 DIAGNOSIS — E7800 Pure hypercholesterolemia, unspecified: Secondary | ICD-10-CM

## 2021-04-19 DIAGNOSIS — R7301 Impaired fasting glucose: Secondary | ICD-10-CM

## 2021-04-19 DIAGNOSIS — R7989 Other specified abnormal findings of blood chemistry: Secondary | ICD-10-CM

## 2021-04-19 LAB — LIPID PANEL
Chol/HDL Ratio: 2.9 ratio (ref 0.0–5.0)
Cholesterol, Total: 174 mg/dL (ref 100–199)
HDL: 59 mg/dL (ref >39)
LDL Chol Calc (NIH): 102 mg/dL — ABNORMAL HIGH (ref 0–99)
Triglycerides: 69 mg/dL (ref 0–149)
VLDL Cholesterol Cal: 13 mg/dL (ref 5–40)

## 2021-04-19 LAB — HEPATIC FUNCTION PANEL
ALT: 26 IU/L (ref 0–44)
AST: 25 IU/L (ref 0–40)
Albumin: 4.8 g/dL (ref 3.8–4.8)
Alkaline Phosphatase: 78 IU/L (ref 44–121)
Bilirubin Total: 1.3 mg/dL — ABNORMAL HIGH (ref 0.0–1.2)
Bilirubin, Direct: 0.31 mg/dL (ref 0.00–0.40)
Total Protein: 6.8 g/dL (ref 6.0–8.5)

## 2021-04-19 LAB — HEMOGLOBIN A1C
Est. average glucose Bld gHb Est-mCnc: 120 mg/dL
Hgb A1c MFr Bld: 5.8 % — ABNORMAL HIGH (ref 4.8–5.6)

## 2021-04-19 LAB — GLUCOSE, RANDOM: Glucose: 99 mg/dL (ref 65–99)

## 2021-04-19 NOTE — Telephone Encounter (Signed)
Got pt scheduled 

## 2021-05-06 ENCOUNTER — Other Ambulatory Visit: Payer: Self-pay | Admitting: Family Medicine

## 2021-05-06 DIAGNOSIS — E7800 Pure hypercholesterolemia, unspecified: Secondary | ICD-10-CM

## 2021-05-06 DIAGNOSIS — I10 Essential (primary) hypertension: Secondary | ICD-10-CM

## 2021-05-06 DIAGNOSIS — F418 Other specified anxiety disorders: Secondary | ICD-10-CM

## 2021-05-06 DIAGNOSIS — M109 Gout, unspecified: Secondary | ICD-10-CM

## 2021-07-05 ENCOUNTER — Other Ambulatory Visit: Payer: Self-pay | Admitting: Family Medicine

## 2021-07-05 DIAGNOSIS — F411 Generalized anxiety disorder: Secondary | ICD-10-CM

## 2021-07-22 ENCOUNTER — Other Ambulatory Visit: Payer: Medicare Other

## 2021-08-06 ENCOUNTER — Other Ambulatory Visit: Payer: Self-pay | Admitting: Family Medicine

## 2021-08-06 DIAGNOSIS — F418 Other specified anxiety disorders: Secondary | ICD-10-CM

## 2021-08-06 DIAGNOSIS — M109 Gout, unspecified: Secondary | ICD-10-CM

## 2021-08-06 DIAGNOSIS — E7800 Pure hypercholesterolemia, unspecified: Secondary | ICD-10-CM

## 2021-08-06 DIAGNOSIS — I10 Essential (primary) hypertension: Secondary | ICD-10-CM

## 2021-08-20 ENCOUNTER — Other Ambulatory Visit: Payer: Medicare Other

## 2021-09-09 ENCOUNTER — Other Ambulatory Visit: Payer: Medicare Other

## 2021-09-30 ENCOUNTER — Other Ambulatory Visit: Payer: Medicare Other

## 2021-10-07 ENCOUNTER — Other Ambulatory Visit: Payer: Self-pay | Admitting: Family Medicine

## 2021-10-07 DIAGNOSIS — F411 Generalized anxiety disorder: Secondary | ICD-10-CM

## 2021-10-07 NOTE — Telephone Encounter (Signed)
Is this okay to refill? 

## 2021-10-10 ENCOUNTER — Other Ambulatory Visit: Payer: Self-pay

## 2021-10-10 ENCOUNTER — Other Ambulatory Visit: Payer: Medicare Other

## 2021-10-10 DIAGNOSIS — E7800 Pure hypercholesterolemia, unspecified: Secondary | ICD-10-CM

## 2021-10-10 DIAGNOSIS — R7301 Impaired fasting glucose: Secondary | ICD-10-CM

## 2021-10-10 DIAGNOSIS — R7989 Other specified abnormal findings of blood chemistry: Secondary | ICD-10-CM

## 2021-10-11 LAB — HEPATIC FUNCTION PANEL
ALT: 31 IU/L (ref 0–44)
AST: 32 IU/L (ref 0–40)
Albumin: 4.9 g/dL — ABNORMAL HIGH (ref 3.8–4.8)
Alkaline Phosphatase: 80 IU/L (ref 44–121)
Bilirubin Total: 0.5 mg/dL (ref 0.0–1.2)
Bilirubin, Direct: 0.15 mg/dL (ref 0.00–0.40)
Total Protein: 7.6 g/dL (ref 6.0–8.5)

## 2021-10-11 LAB — LIPID PANEL
Chol/HDL Ratio: 3.3 ratio (ref 0.0–5.0)
Cholesterol, Total: 192 mg/dL (ref 100–199)
HDL: 59 mg/dL (ref >39)
LDL Chol Calc (NIH): 116 mg/dL — ABNORMAL HIGH (ref 0–99)
Triglycerides: 92 mg/dL (ref 0–149)
VLDL Cholesterol Cal: 17 mg/dL (ref 5–40)

## 2021-10-11 LAB — GLUCOSE, RANDOM: Glucose: 110 mg/dL — ABNORMAL HIGH (ref 70–99)

## 2021-10-11 LAB — HEMOGLOBIN A1C
Est. average glucose Bld gHb Est-mCnc: 117 mg/dL
Hgb A1c MFr Bld: 5.7 % — ABNORMAL HIGH (ref 4.8–5.6)

## 2021-10-22 ENCOUNTER — Encounter: Payer: Self-pay | Admitting: Family Medicine

## 2021-10-22 NOTE — Progress Notes (Signed)
Chief Complaint  Patient presents with   Urinary Frequency    Started 3-4 weeks ago at end of urine stream notices blood for the past few days. Denies burning, denies fullness sensation after emptying bladder    Patient presents for med check (had labs done earlier this month), with chief complaint of urinary symptoms.  He reports that he noticed in the last 3-4 weeks some increased urinary urgency and frequency.  In the last few days, especially after having a bowel movement, he ha noticed red drips (blood) at the very end of voiding.   Denies any dysuria.  Denies any abdominal pain or flank pain. No penile discharge.  Monogamous relationship, denies risks for STDs. He used to just get up once at night, but recently is getting up 2x/night, and notices more frequency during the day (q3 hours rather than q5 hours). No changes to caffeine (1-2 cups/coffee). No significant change in alcohol from his prior pattern (used to drink more, and didn't have this issue). Doesn't take aspirin or NSAIDs. No fever or chills.  He recently had fasting labs repeated. He is scheduled for AWV in April.  Lab Results  Component Value Date   HGBA1C 5.7 (H) 10/10/2021   Fasting glucose 110  Lab Results  Component Value Date   CHOL 192 10/10/2021   HDL 59 10/10/2021   LDLCALC 116 (H) 10/10/2021   TRIG 92 10/10/2021   CHOLHDL 3.3 10/10/2021   Lab Results  Component Value Date   ALT 31 10/10/2021   AST 32 10/10/2021   ALKPHOS 80 10/10/2021   BILITOT 0.5 10/10/2021   Hypertension:  He reports compliance with his medication regimen of Atenolol 55m and lisinopril 220mand denies side effects. He denies headaches, dizziness, chest pain, cough.  Follows low sodium diet. BP's are running 120-130/75-90 (usually 78-82) [facility]   Impaired fasting glucose--he tries to follow a healthy diet, limiting carbs, sweets, sugar and getting regular exercise. Avoiding carbs at  home, and really has tried eliminating them from diet entirely.  Very rare desserts. Alcohol intake fluctuates up and down.  Plans to stop drinking alcohol in January (hoping for longterm). Has been heavier since Thanksgiving.  Having 2-3 glasses at a time, not daily.   Hyperlipidemia follow-up:  Patient is reportedly following a low-fat, low cholesterol diet.  Compliant with medications ([id number]) and denies medication side effects.   Eating red meat more frequently (eating more elk), steak infrequently.  Mainly eats egg whites, rare yolk.   Gout--He denies any gout flares. Prior gout was foot/ankle/toes.  Compliant with 100m70m allopurinol without side effects.   Anxiety:  He uses the alprazolam as needed, when his mind is worrying, thinking too much about things, and also uses with flying.  Takes 1/2 tablet at a time. Sometimes will go a week without any. Last refilled #30 on 10/07/21, rx's last for 3 months at a time.   Prostate cancer--Previously monitored by Dr. OtteKarsten Ro/p radioactive seed implant, and has done very well.  He was released to PCP to follow PSA's, and to see him prn only.  He will be due for recheck of PSA in March.  PMH, PSH, SH reviewed  Outpatient Encounter Medications as of 10/23/2021  Medication Sig Note   allopurinol (ZYLOPRIM) 100 MG tablet TAKE ONE TABLET BY MOUTH DAILY    ALPRAZolam (XANAX) 0.5 MG tablet TAKE 1/2 TO 1 TABLET BY MOUTH THREE TIMES A DAY AS NEEDED FOR  ANXIETY    atenolol (TENORMIN) 25 MG tablet TAKE ONE TABLET BY MOUTH DAILY    atorvastatin (LIPITOR) 40 MG tablet TAKE ONE TABLET BY MOUTH DAILY    lisinopril (ZESTRIL) 20 MG tablet TAKE ONE TABLET BY MOUTH DAILY    omeprazole (PRILOSEC) 20 MG capsule Take 20 mg by mouth as needed. (Patient not taking: Reported on 04/23/2020) 10/23/2021: Last taken 1 month ago   No facility-administered encounter medications on file as of 10/23/2021.   No Known Allergies  ROS: no fever, chill, n/v/d,  headache, dizziness, chest pain, shortness of breath, edema. No joint pains, fatigue, bleeding, bruising, rash. Urinary complaints pre HPI.   PHYSICAL EXAM:  BP 122/88 (BP Location: Right Arm, Patient Position: Sitting)    Pulse (!) 56    Wt 176 lb 9.6 oz (80.1 kg)    SpO2 99%    BMI 25.34 kg/m   140/80 on repeat by MD  Wt Readings from Last 3 Encounters:  10/23/21 176 lb 9.6 oz (80.1 kg)  01/16/21 188 lb (85.3 kg)  04/23/20 183 lb (83 kg)   Well-appearing, pleasant male, in good spirits, in no distress HEENT: conjunctiva and sclera are clear, EOMI, wearing mask Neck: no lymphadenopathy or mass Heart: regular rate and rhythm Lungs: clear bilaterally Back: no spinal or CVA tenderness Abdomen: soft, nontender, no mass Extremities: no edema Rectal: normal sphincter tone, no mass.  Prostate is boggy, enlarged, though nontender Normal exam of penis--no inflammation, bleeding, discharge. No inguinal lymphadenopathy  Urine dip: 3+ blood, SG 1.015, trace protein. Neg nit/leuks  ASSESSMENT/PLAN:  Other microscopic hematuria - hematuria at end of void, especially after having BM.  Given prostate bogginess, suspect early prostatitis, vs other UTI. - Plan: Urine Culture  Increased urinary frequency - Plan: POCT URINALYSIS DIP (CLINITEK)  Acute prostatitis with hematuria - suspect early infection.  Will treat with Bactrim and send culture. f/u in 2 weeks; will repeat urine and/or exam to determine if add'l 2 wks of ABX needed - Plan: sulfamethoxazole-trimethoprim (BACTRIM DS) 800-160 MG tablet  Impaired fasting glucose - stable. Continue low carb diet and regular exercise  Pure hypercholesterolemia - controlled with statin and low cholesterol diet  Essential hypertension, benign - controlled on current regimen  Gout of right foot, unspecified cause, unspecified chronicity - continue allopurinol and limiting alcohol  Anxiety - using alprazolam infrequently (#30 lasting 3 months),  risks reviewed  Pt to f/u in office in 2 weeks for recheck (needs urine dip at visit).  He is scheduled for CPE 01/29/22, first morning appt, so will come fasting.  Will need C-met, cbc, uric acid, PSA. Can discuss at that time whether or not to repeat A1c, lipids, depending on what his diet has been like.

## 2021-10-23 ENCOUNTER — Ambulatory Visit (INDEPENDENT_AMBULATORY_CARE_PROVIDER_SITE_OTHER): Payer: Medicare Other | Admitting: Family Medicine

## 2021-10-23 ENCOUNTER — Encounter: Payer: Self-pay | Admitting: Family Medicine

## 2021-10-23 ENCOUNTER — Other Ambulatory Visit: Payer: Self-pay

## 2021-10-23 VITALS — BP 122/88 | HR 56 | Wt 176.6 lb

## 2021-10-23 DIAGNOSIS — E7800 Pure hypercholesterolemia, unspecified: Secondary | ICD-10-CM

## 2021-10-23 DIAGNOSIS — I10 Essential (primary) hypertension: Secondary | ICD-10-CM

## 2021-10-23 DIAGNOSIS — N410 Acute prostatitis: Secondary | ICD-10-CM

## 2021-10-23 DIAGNOSIS — R3129 Other microscopic hematuria: Secondary | ICD-10-CM | POA: Diagnosis not present

## 2021-10-23 DIAGNOSIS — R7301 Impaired fasting glucose: Secondary | ICD-10-CM

## 2021-10-23 DIAGNOSIS — M109 Gout, unspecified: Secondary | ICD-10-CM

## 2021-10-23 DIAGNOSIS — R350 Frequency of micturition: Secondary | ICD-10-CM | POA: Diagnosis not present

## 2021-10-23 DIAGNOSIS — F419 Anxiety disorder, unspecified: Secondary | ICD-10-CM

## 2021-10-23 LAB — POCT URINALYSIS DIP (CLINITEK)
Bilirubin, UA: NEGATIVE
Glucose, UA: NEGATIVE mg/dL
Ketones, POC UA: NEGATIVE mg/dL
Leukocytes, UA: NEGATIVE
Nitrite, UA: NEGATIVE
POC PROTEIN,UA: 30 — AB
Spec Grav, UA: 1.015 (ref 1.010–1.025)
Urobilinogen, UA: 0.2 E.U./dL
pH, UA: 6 (ref 5.0–8.0)

## 2021-10-23 MED ORDER — SULFAMETHOXAZOLE-TRIMETHOPRIM 800-160 MG PO TABS: 1.0000 | ORAL_TABLET | Freq: Two times a day (BID) | ORAL | 0 refills | Status: DC

## 2021-10-23 NOTE — Patient Instructions (Signed)
We are treating you for a presumptive early prostatitis. If you aren't improving over the next week, let us know and we can switch the antibiotic to Cipro. Definitely call us if you develop fever, pain, increased bleeding, troubling emptying your bladder, or any reaction to the prescribed antibiotic.  Return in 2 weeks.  At that time, we will recheck your urine, and decide if additional 2 weeks of antibiotics are necessary.  We should get the urine culture results within this week.

## 2021-10-25 LAB — URINE CULTURE: Organism ID, Bacteria: NO GROWTH

## 2021-11-04 ENCOUNTER — Encounter: Payer: Self-pay | Admitting: Family Medicine

## 2021-11-05 NOTE — Progress Notes (Signed)
Chief Complaint  Patient presents with   Follow-up    Urinary frequency has reduced a lot. Can no longer visibly see any blood in his urine. The last 48hrs he has noticed much less of an urgency and frequency-not back to normal. Rash, he think is irrelevant. He has not used anything topical. The area on the right is the most prominent still, some on left. He says it not irritating-he ignores it. This past week weekend he had chills and fever, gone now. Also lbp and body aches. Feeling better since he broke his fever. Has a dull HA in the back of his head.     Patient presents for follow-up on prostatitis. He was seen 12/28 with urgency, frequency, and hematuria at end of void, especially after a bowel movement. Urine showed large blood, trace protein.  Prostate was boggy.  Urine culture was negative.  After his last visit he noted some headache (posterior, not like usual headaches), flu-like symptoms (body aches, f/c). Didn't have a thermometer, felt like low-grade.  1/7 he felt the fever much higher, fever broke (was sweating) and fever since resolved. No fever since 1/8, and urinary urgency started improving. 1/9 he was feeling better--no longer having headache or fever, urinary symptoms still improved (not completely better), but started with diarrhea and itching and rash on R upper arm. Yesterday he noted rash spreading to L arm, chest and low back, not itchy. Diarrhea is improving.  Stools were loose, but not frequent.  Improved today. Rash on the R arm improved after stopping the antibiotic.  Feels less pressure from the prostate.. Urgency has improved, not yet normal. Empties well, no dysuria, no blood.   PMH, PSH, SH reviewed  Outpatient Encounter Medications as of 11/06/2021  Medication Sig Note   acetaminophen (TYLENOL) 325 MG tablet Take 650 mg by mouth every 6 (six) hours as needed. 11/06/2021: Took yesterday at 6pm   allopurinol (ZYLOPRIM) 100 MG tablet TAKE ONE TABLET BY MOUTH  DAILY    atenolol (TENORMIN) 25 MG tablet TAKE ONE TABLET BY MOUTH DAILY    atorvastatin (LIPITOR) 40 MG tablet TAKE ONE TABLET BY MOUTH DAILY    lisinopril (ZESTRIL) 20 MG tablet TAKE ONE TABLET BY MOUTH DAILY    omeprazole (PRILOSEC) 20 MG capsule Take 20 mg by mouth as needed. 10/23/2021: Last taken 1 month ago   ALPRAZolam (XANAX) 0.5 MG tablet TAKE 1/2 TO 1 TABLET BY MOUTH THREE TIMES A DAY AS NEEDED FOR ANXIETY (Patient not taking: Reported on 11/06/2021) 11/06/2021: As needed   [DISCONTINUED] sulfamethoxazole-trimethoprim (BACTRIM DS) 800-160 MG tablet Take 1 tablet by mouth 2 (two) times daily.    No facility-administered encounter medications on file as of 11/06/2021.   Stopped the Bactrim yesterday  NKDA previously known.  ROS:  f/c and headache resolved. Myalgias resolved. Urinary symptoms resolved. Rash per HPI Constipation initially, f/b diarrhea, improving.    PHYSICAL EXAM:  BP (!) 90/58    Pulse 60    Temp 98 F (36.7 C) (Tympanic)    Ht 5\' 10"  (1.778 m)    Wt 172 lb 9.6 oz (78.3 kg)    BMI 24.77 kg/m   Wt Readings from Last 3 Encounters:  11/06/21 172 lb 9.6 oz (78.3 kg)  10/23/21 176 lb 9.6 oz (80.1 kg)  01/16/21 188 lb (85.3 kg)    Well-appearing, pleasant male, in good spirits, in no distress HEENT: conjunctiva and sclera are clear, EOMI, wearing mask Neck: no lymphadenopathy or mass Heart: regular  rate and rhythm Lungs: clear bilaterally Back: no spinal or CVA tenderness Abdomen: soft, nontender, no mass Extremities: no edema Rectal: normal sphincter tone, no mass.  Prostate is somewhat boggy, smaller, nontender.  Skin: Very faint erythema at his chest/breast area.  Some faint erythema at back.  No palpable rash Small 2 excoriations on L upper arm. Mild erythema/rash at R deltoid area (macular, no focal papules (just diffuse mild redness)  Urine: SG 1.015, otherwise negative    ASSESSMENT/PLAN:  Acute prostatitis - improving. Took Bactrim for  just shy of 2 wks, stopped due to rash. Change to cipro x 2 weeks - Plan: ciprofloxacin (CIPRO) 500 MG tablet  Urinary frequency - Plan: POCT Urinalysis DIP (Proadvantage Device)  Allergic reaction due to antibacterial drug - mild rash from Bactrim.  Unsure if the fever/headache is related to adverse reaction to medication vs prostatitis itself (hadn't had fever prior to meds)

## 2021-11-06 ENCOUNTER — Encounter: Payer: Self-pay | Admitting: Family Medicine

## 2021-11-06 ENCOUNTER — Ambulatory Visit (INDEPENDENT_AMBULATORY_CARE_PROVIDER_SITE_OTHER): Payer: Medicare Other | Admitting: Family Medicine

## 2021-11-06 ENCOUNTER — Other Ambulatory Visit: Payer: Self-pay

## 2021-11-06 VITALS — BP 90/58 | HR 60 | Temp 98.0°F | Ht 70.0 in | Wt 172.6 lb

## 2021-11-06 DIAGNOSIS — N410 Acute prostatitis: Secondary | ICD-10-CM | POA: Diagnosis not present

## 2021-11-06 DIAGNOSIS — R350 Frequency of micturition: Secondary | ICD-10-CM

## 2021-11-06 DIAGNOSIS — T3695XA Adverse effect of unspecified systemic antibiotic, initial encounter: Secondary | ICD-10-CM | POA: Diagnosis not present

## 2021-11-06 LAB — POCT URINALYSIS DIP (PROADVANTAGE DEVICE)
Bilirubin, UA: NEGATIVE
Blood, UA: NEGATIVE
Glucose, UA: NEGATIVE mg/dL
Ketones, POC UA: NEGATIVE mg/dL
Leukocytes, UA: NEGATIVE
Nitrite, UA: NEGATIVE
Protein Ur, POC: NEGATIVE mg/dL
Specific Gravity, Urine: 1.015
Urobilinogen, Ur: NEGATIVE
pH, UA: 6 (ref 5.0–8.0)

## 2021-11-06 MED ORDER — CIPROFLOXACIN HCL 500 MG PO TABS
500.0000 mg | ORAL_TABLET | Freq: Two times a day (BID) | ORAL | 0 refills | Status: AC
Start: 2021-11-06 — End: 2021-11-20

## 2021-11-06 NOTE — Patient Instructions (Signed)
Infection is improving. We need 2 more weeks of antibiotics. Due to the rash, we switched you to Cipro.  No need to follow up if your symptoms completely resolve.

## 2021-11-11 ENCOUNTER — Other Ambulatory Visit: Payer: Self-pay | Admitting: Family Medicine

## 2021-11-11 DIAGNOSIS — M109 Gout, unspecified: Secondary | ICD-10-CM

## 2021-11-11 DIAGNOSIS — E7800 Pure hypercholesterolemia, unspecified: Secondary | ICD-10-CM

## 2021-11-11 DIAGNOSIS — F418 Other specified anxiety disorders: Secondary | ICD-10-CM

## 2021-11-11 DIAGNOSIS — I10 Essential (primary) hypertension: Secondary | ICD-10-CM

## 2021-11-11 MED ORDER — ALLOPURINOL 100 MG PO TABS: 100.0000 mg | ORAL_TABLET | Freq: Every day | ORAL | 0 refills | Status: DC

## 2021-11-11 MED ORDER — ATORVASTATIN CALCIUM 40 MG PO TABS: 40.0000 mg | ORAL_TABLET | Freq: Every day | ORAL | 0 refills | Status: DC

## 2021-11-11 MED ORDER — ATENOLOL 25 MG PO TABS: 25.0000 mg | ORAL_TABLET | Freq: Every day | ORAL | 0 refills | Status: DC

## 2021-11-11 MED ORDER — LISINOPRIL 20 MG PO TABS: 20.0000 mg | ORAL_TABLET | Freq: Every day | ORAL | 0 refills | Status: DC

## 2022-01-07 ENCOUNTER — Other Ambulatory Visit: Payer: Self-pay | Admitting: Family Medicine

## 2022-01-07 DIAGNOSIS — F411 Generalized anxiety disorder: Secondary | ICD-10-CM

## 2022-01-07 NOTE — Telephone Encounter (Signed)
[facility] is requesting to fill pt xanax. Please advise [facility] 

## 2022-01-29 ENCOUNTER — Encounter: Payer: Medicare Other | Admitting: Family Medicine

## 2022-02-07 ENCOUNTER — Other Ambulatory Visit: Payer: Self-pay | Admitting: Family Medicine

## 2022-02-07 DIAGNOSIS — I10 Essential (primary) hypertension: Secondary | ICD-10-CM

## 2022-02-07 DIAGNOSIS — M109 Gout, unspecified: Secondary | ICD-10-CM

## 2022-02-07 DIAGNOSIS — F418 Other specified anxiety disorders: Secondary | ICD-10-CM

## 2022-02-07 DIAGNOSIS — E7800 Pure hypercholesterolemia, unspecified: Secondary | ICD-10-CM

## 2022-02-07 NOTE — Telephone Encounter (Signed)
Is it ok to refill the Atenolol? I saw that it was for performance anxiety and also the Allopurinol? Pts last labs were 12/22 ?

## 2022-04-01 ENCOUNTER — Other Ambulatory Visit: Payer: Self-pay | Admitting: Family Medicine

## 2022-04-01 DIAGNOSIS — F411 Generalized anxiety disorder: Secondary | ICD-10-CM

## 2022-04-01 NOTE — Telephone Encounter (Signed)
Is this okay to refill? 

## 2022-05-05 ENCOUNTER — Encounter: Payer: Medicare Other | Admitting: Family Medicine

## 2022-05-12 ENCOUNTER — Other Ambulatory Visit: Payer: Self-pay | Admitting: Family Medicine

## 2022-05-12 DIAGNOSIS — E7800 Pure hypercholesterolemia, unspecified: Secondary | ICD-10-CM

## 2022-05-12 DIAGNOSIS — F418 Other specified anxiety disorders: Secondary | ICD-10-CM

## 2022-05-12 DIAGNOSIS — M109 Gout, unspecified: Secondary | ICD-10-CM

## 2022-05-12 DIAGNOSIS — I10 Essential (primary) hypertension: Secondary | ICD-10-CM

## 2022-06-09 ENCOUNTER — Encounter: Payer: Medicare Other | Admitting: Family Medicine

## 2022-06-12 ENCOUNTER — Encounter: Payer: Self-pay | Admitting: Family Medicine

## 2022-06-12 ENCOUNTER — Ambulatory Visit (INDEPENDENT_AMBULATORY_CARE_PROVIDER_SITE_OTHER): Payer: Medicare Other | Admitting: Family Medicine

## 2022-06-12 VITALS — BP 122/80 | HR 64 | Temp 100.6°F | Ht 70.0 in | Wt 177.6 lb

## 2022-06-12 DIAGNOSIS — R109 Unspecified abdominal pain: Secondary | ICD-10-CM | POA: Diagnosis not present

## 2022-06-12 DIAGNOSIS — R509 Fever, unspecified: Secondary | ICD-10-CM | POA: Diagnosis not present

## 2022-06-12 DIAGNOSIS — R52 Pain, unspecified: Secondary | ICD-10-CM

## 2022-06-12 DIAGNOSIS — R6883 Chills (without fever): Secondary | ICD-10-CM | POA: Diagnosis not present

## 2022-06-12 LAB — POCT URINALYSIS DIP (PROADVANTAGE DEVICE)
Glucose, UA: NEGATIVE mg/dL
Leukocytes, UA: NEGATIVE
Nitrite, UA: NEGATIVE
Protein Ur, POC: 30 mg/dL — AB
Specific Gravity, Urine: 1.02
Urobilinogen, Ur: 0.2
pH, UA: 6 (ref 5.0–8.0)

## 2022-06-12 LAB — POC COVID19 BINAXNOW: SARS Coronavirus 2 Ag: NEGATIVE

## 2022-06-12 NOTE — Progress Notes (Signed)
Chief Complaint  Patient presents with   Abdominal Pain    Abdominal pain, diarrhea and loss of appetite that started Tuesday am. Felt ok Tuesday night after eating but worse Wed. His abdominal pain feel like he did 500 crunches[place] and has laceration on left hand, used neosporin-wonders if he has infection. Also has fever, chills and body aches.   2 days ago he was awoken with abdominal pain, and diarrhea. Pain is across the lower abdomen, but more at the RLQ. Seems muscular--hurts with movement, position changes. No change in intensity of the pain, maybe slightly better today.  Stool is very watery (more solid when he ate more, looser when not eating solids). Had 6-7 episodes today--more diarrhea in the morning, more just passing gas (very small amount of stool)  later in the day. Discomfort resolves with passing gas. He is passing a lot of gas. He has decreased appetite. Noted fever 1-2 days ago, had some chills. No nausea, vomiting, heartburn.  No sick contacts. 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  H/o prostatitis in 09/2021.  Denies urgency/frequency/hematuria  No dairy, fried foods.  On Tuesday night he ate some broth, beans (cannelini), spinach, arugula, onions; diarrhea worse that night.  He cut his left hand on some rocks on 8/14. Cleaned with antiseptic  PMH, PSH, SH reviewed  Outpatient Encounter Medications as of 06/12/2022  Medication Sig Note   allopurinol (ZYLOPRIM) 100 MG tablet TAKE ONE TABLET BY MOUTH DAILY    atenolol (TENORMIN) 25 MG tablet TAKE ONE TABLET BY MOUTH DAILY    atorvastatin (LIPITOR) 40 MG tablet TAKE ONE TABLET BY MOUTH DAILY    lisinopril (ZESTRIL) 20 MG tablet TAKE ONE TABLET BY MOUTH DAILY    acetaminophen (TYLENOL) 325 MG tablet Take 650 mg by mouth every 6 (six) hours as needed. (Patient not taking: Reported on 06/12/2022) 06/12/2022: prn   ALPRAZolam (XANAX) 0.5 MG tablet TAKE ONE-HALF  TO ONE (0.5-1) TABLET BY MOUTH THREE TIMES A DAY AS NEEDED FOR ANXIETY (Patient not taking: Reported on 06/12/2022) 06/12/2022: prn   omeprazole (PRILOSEC) 20 MG capsule Take 20 mg by mouth as needed. (Patient not taking: Reported on 06/12/2022) 06/12/2022: prn   No facility-administered encounter medications on file as of 06/12/2022.   Allergies  Allergen Reactions   Sulfa Antibiotics Rash   ROS: +f/c, myalgias, abdominal pain and diarrhea per HPI. No URI symptoms. No nausea or vomiting. +decreased appetite. No urinary complaints, bleeding, bruising or rash.  Superficial laceration per HPI.   PHYSICAL EXAM:  BP 122/80   Pulse 64   Temp (!) 100.6 F (38.1 C) (Tympanic)   Ht '5\' 10"'$  (1.778 m)   Wt 177 lb 9.6 oz (80.6 kg)   BMI 25.48 kg/m   Well-appearing, pleasant male in no distress HEENT: conjunctiva and sclera are clear, EOMI. OP clear Neck: no lymphadenopathy, thyromegaly or mass Heart: regular rate and rhythm Lungs: clear bilaterally Abdomen: Hyperactive BS, soft, nondistended. Mildly tender at suprapubic area and RLQ. No rebound or guarding. Extremities: no edema Skin: normal turgor, no rash. L hand--Superficial linear laceration at the base of the L 5th finger, palmar surface. Measures 5m . No erythema, crusting. No edema.  Urine dip: dark yellow, small bilirubin, small ketones, small blood and protein. Negative nitrites and leukocytes.   ASSESSMENT/PLAN:  Abdominal pain, unspecified abdominal location - r/b passing of gas. Reviewed low-gas, bland diet, red flag s/sx - Plan: POCT Urinalysis DIP (Proadvantage Device),  Comprehensive metabolic panel, CBC with Differential/Platelet  Fever, unspecified fever cause - suspect viral syndrome--either gastroenteritis, vs COVID. Negative rapid test. Rec repeat home test in 1-2d - Plan: POC COVID-19, CBC with Differential/Platelet  Chills - Plan: POC COVID-19  Body aches - Plan: POC COVID-19   I spent 34 minutes dedicated  to the care of this patient, including pre-visit review of records, face to face time, post-visit ordering of testing and documentation.  Stay well hydrated (either electrolyte drink or water--nothing very sugary or caffeinated or alcoholic) Avoid dairy. Limit some of the gas-producing foods (beans, greens). Consider use of simethicone (Gas-X) as needed for abdominal discomfort. Stick with a bland diet.  I would start with BRAT diet--banana, rice, applesauce, toast, and advance as tolerated.  Stay in isolation for another day until you repeat a home COVID test. If it is positive, Tuesday (first day of symptoms) counts as Day 0).  Your test results will be in [place] re-evaluation for persistent/higher fevers, worsening abdominal pain, blood in the stool, or other new concerns.

## 2022-06-12 NOTE — Patient Instructions (Addendum)
  Stay well hydrated (either electrolyte drink or water--nothing very sugary or caffeinated or alcoholic) Avoid dairy. Limit some of the gas-producing foods (beans, greens). Consider use of simethicone (Gas-X) as needed for abdominal discomfort. Stick with a bland diet.  I would start with BRAT diet--banana, rice, applesauce, toast, and advance as tolerated.  Stay in isolation for another day until you repeat a home COVID test. If it is positive, Tuesday (first day of symptoms) counts as Day 0).  Your test results will be in [place] re-evaluation for persistent/higher fevers, worsening abdominal pain, blood in the stool, or other new concerns.  You may use imodium if you continue to have frequent loose stools.

## 2022-06-13 ENCOUNTER — Other Ambulatory Visit: Payer: Self-pay

## 2022-06-13 ENCOUNTER — Telehealth: Payer: Self-pay

## 2022-06-13 ENCOUNTER — Encounter: Payer: Self-pay | Admitting: Family Medicine

## 2022-06-13 DIAGNOSIS — R509 Fever, unspecified: Secondary | ICD-10-CM

## 2022-06-13 DIAGNOSIS — R1031 Right lower quadrant pain: Secondary | ICD-10-CM

## 2022-06-13 DIAGNOSIS — R109 Unspecified abdominal pain: Secondary | ICD-10-CM

## 2022-06-13 LAB — COMPREHENSIVE METABOLIC PANEL
ALT: 22 IU/L (ref 0–44)
AST: 22 IU/L (ref 0–40)
Albumin/Globulin Ratio: 2 (ref 1.2–2.2)
Albumin: 4.6 g/dL (ref 3.9–4.9)
Alkaline Phosphatase: 82 IU/L (ref 44–121)
BUN/Creatinine Ratio: 11 (ref 10–24)
BUN: 12 mg/dL (ref 8–27)
Bilirubin Total: 1.8 mg/dL — ABNORMAL HIGH (ref 0.0–1.2)
CO2: 24 mmol/L (ref 20–29)
Calcium: 9.5 mg/dL (ref 8.6–10.2)
Chloride: 92 mmol/L — ABNORMAL LOW (ref 96–106)
Creatinine, Ser: 1.1 mg/dL (ref 0.76–1.27)
Globulin, Total: 2.3 g/dL (ref 1.5–4.5)
Glucose: 109 mg/dL — ABNORMAL HIGH (ref 70–99)
Potassium: 4.2 mmol/L (ref 3.5–5.2)
Sodium: 133 mmol/L — ABNORMAL LOW (ref 134–144)
Total Protein: 6.9 g/dL (ref 6.0–8.5)
eGFR: 73 mL/min/{1.73_m2} (ref >59)

## 2022-06-13 LAB — CBC WITH DIFFERENTIAL/PLATELET
Basophils Absolute: 0.1 10*3/uL (ref 0.0–0.2)
Basos: 0 %
EOS (ABSOLUTE): 0.1 10*3/uL (ref 0.0–0.4)
Eos: 1 %
Hematocrit: 44.4 % (ref 37.5–51.0)
Hemoglobin: 15 g/dL (ref 13.0–17.7)
Immature Grans (Abs): 0 10*3/uL (ref 0.0–0.1)
Immature Granulocytes: 0 %
Lymphocytes Absolute: 1 10*3/uL (ref 0.7–3.1)
Lymphs: 5 %
MCH: 32 pg (ref 26.6–33.0)
MCHC: 33.8 g/dL (ref 31.5–35.7)
MCV: 95 fL (ref 79–97)
Monocytes Absolute: 2.1 10*3/uL — ABNORMAL HIGH (ref 0.1–0.9)
Monocytes: 11 %
Neutrophils Absolute: 15.8 10*3/uL — ABNORMAL HIGH (ref 1.4–7.0)
Neutrophils: 83 %
Platelets: 253 10*3/uL (ref 150–450)
RBC: 4.69 x10E6/uL (ref 4.14–5.80)
RDW: 12.9 % (ref 11.6–15.4)
WBC: 19.1 10*3/uL — ABNORMAL HIGH (ref 3.4–10.8)

## 2022-06-13 NOTE — Telephone Encounter (Signed)
Spoke to [facility] imaging and they told me they do walk ins until 3. Spoke to pt and he could not make it there by 3. Gave pt the number to [facility] imaging and he said he would call them to schedule. He did say he's feeling a little better than yesterday.

## 2022-06-14 ENCOUNTER — Encounter: Payer: Self-pay | Admitting: Family Medicine

## 2022-06-15 NOTE — Progress Notes (Unsigned)
  No chief complaint on file.  Patient presents for follow-up on abdominal pain. He was seen last week with fever, myalgias, diarrhea, abdominal pain. WBC was elevated, and CT was recommended.  Unfortunately, this was not able to be done on Friday. COVID tests were negative x2 (once in office, and again at home the next day). Lab Results  Component Value Date   WBC 19.1 (H) 06/12/2022   HGB 15.0 06/12/2022   HCT 44.4 06/12/2022   MCV 95 06/12/2022   PLT 253 06/12/2022   He reported on 8/19-- - No diarrhea in past 24 hours - no bowel movement - prior did not see a trace of blood in the stool. - The abdominal soreness is still present but I'm not feeling the same level of discomfort when sleeping at night or during the day. - Starting yesterday, once the diarrhea subsided, my bladder started getting more water and I discovered an ugly brown color in the urine. I assume old blood.  This was the same on 3 visits to bathroom yesterday with the last visit at 11PM last night when I went to bed.  I slept through until 5AM and got up - my urine showed signs of clearing - a mix of clear and brown.  Have not visited the bathroom again and drinking a lot of water.   He was advised to schedule a f/u for today (since CT hadn't been done, had some symptoms improving, but also new symptoms)--to repeat urine, CBC   PMH, PSH, SH reviewed   ROS:   PHYSICAL EXAM:  There were no vitals taken for this visit.  Wt Readings from Last 3 Encounters:  06/12/22 177 lb 9.6 oz (80.6 kg)  11/06/21 172 lb 9.6 oz (78.3 kg)  10/23/21 176 lb 9.6 oz (80.1 kg)     ASSESSMENT/PLAN:  I asked for CT to be done on Friday (see result note) It was put in as "routine" I realized this when I cosigned order--not addressed until after 2pm, and wasn't able to get pt in that day.  Didn't change order or change location, just told that GI takes walk-ins until 3pm, but was told at 2:40 and pt couldn't get there by 3.  Told to  call daily for walk-in. ????  Repeat urine dip If having urinary symptoms, may need to check prostate (since he had prostatitis in the past), especially if still febrile. Repeat CBC

## 2022-06-16 ENCOUNTER — Other Ambulatory Visit (INDEPENDENT_AMBULATORY_CARE_PROVIDER_SITE_OTHER): Payer: Medicare Other

## 2022-06-16 ENCOUNTER — Ambulatory Visit (INDEPENDENT_AMBULATORY_CARE_PROVIDER_SITE_OTHER): Payer: Medicare Other | Admitting: Family Medicine

## 2022-06-16 ENCOUNTER — Encounter: Payer: Self-pay | Admitting: Family Medicine

## 2022-06-16 VITALS — BP 120/70 | HR 79 | Temp 96.9°F | Wt 172.6 lb

## 2022-06-16 DIAGNOSIS — R1031 Right lower quadrant pain: Secondary | ICD-10-CM

## 2022-06-16 DIAGNOSIS — R350 Frequency of micturition: Secondary | ICD-10-CM | POA: Diagnosis not present

## 2022-06-16 LAB — POCT URINALYSIS DIP (PROADVANTAGE DEVICE)
Bilirubin, UA: NEGATIVE
Glucose, UA: NEGATIVE mg/dL
Ketones, POC UA: NEGATIVE mg/dL
Leukocytes, UA: NEGATIVE
Nitrite, UA: NEGATIVE
Protein Ur, POC: NEGATIVE mg/dL
Specific Gravity, Urine: 1.02
Urobilinogen, Ur: 0.2
pH, UA: 5 (ref 5.0–8.0)

## 2022-06-16 LAB — CBC WITH DIFFERENTIAL/PLATELET
Basophils Absolute: 0.1 10*3/uL (ref 0.0–0.2)
Basos: 1 %
EOS (ABSOLUTE): 0.2 10*3/uL (ref 0.0–0.4)
Eos: 2 %
Hematocrit: 42 % (ref 37.5–51.0)
Hemoglobin: 13.8 g/dL (ref 13.0–17.7)
Immature Grans (Abs): 0.1 10*3/uL (ref 0.0–0.1)
Immature Granulocytes: 1 %
Lymphocytes Absolute: 1.5 10*3/uL (ref 0.7–3.1)
Lymphs: 12 %
MCH: 30.5 pg (ref 26.6–33.0)
MCHC: 32.9 g/dL (ref 31.5–35.7)
MCV: 93 fL (ref 79–97)
Monocytes Absolute: 2 10*3/uL — ABNORMAL HIGH (ref 0.1–0.9)
Monocytes: 16 %
Neutrophils Absolute: 8.9 10*3/uL — ABNORMAL HIGH (ref 1.4–7.0)
Neutrophils: 68 %
Platelets: 337 10*3/uL (ref 150–450)
RBC: 4.52 x10E6/uL (ref 4.14–5.80)
RDW: 12 % (ref 11.6–15.4)
WBC: 12.8 10*3/uL — ABNORMAL HIGH (ref 3.4–10.8)

## 2022-06-17 ENCOUNTER — Other Ambulatory Visit: Payer: Self-pay

## 2022-06-17 DIAGNOSIS — R1031 Right lower quadrant pain: Secondary | ICD-10-CM

## 2022-06-17 DIAGNOSIS — R509 Fever, unspecified: Secondary | ICD-10-CM

## 2022-06-18 ENCOUNTER — Other Ambulatory Visit: Payer: Self-pay

## 2022-06-18 ENCOUNTER — Inpatient Hospital Stay (HOSPITAL_COMMUNITY)
Admission: EM | Admit: 2022-06-18 | Discharge: 2022-06-24 | DRG: 372 | Disposition: A | Discharge status: Home / Self Care | Payer: Medicare Other | Attending: Surgery | Admitting: Surgery

## 2022-06-18 ENCOUNTER — Encounter (HOSPITAL_COMMUNITY): Payer: Self-pay

## 2022-06-18 ENCOUNTER — Encounter: Payer: Self-pay | Admitting: Family Medicine

## 2022-06-18 ENCOUNTER — Telehealth: Payer: Self-pay

## 2022-06-18 ENCOUNTER — Emergency Department (HOSPITAL_COMMUNITY)
Admission: RE | Admit: 2022-06-18 | Discharge: 2022-06-18 | Disposition: A | Discharge status: Home / Self Care | Payer: Medicare Other | Source: Ambulatory Visit | Attending: Family Medicine | Admitting: Family Medicine

## 2022-06-18 DIAGNOSIS — K3532 Acute appendicitis with perforation and localized peritonitis, without abscess: Secondary | ICD-10-CM | POA: Diagnosis present

## 2022-06-18 DIAGNOSIS — Z8546 Personal history of malignant neoplasm of prostate: Secondary | ICD-10-CM | POA: Diagnosis not present

## 2022-06-18 DIAGNOSIS — Z823 Family history of stroke: Secondary | ICD-10-CM

## 2022-06-18 DIAGNOSIS — Z833 Family history of diabetes mellitus: Secondary | ICD-10-CM | POA: Diagnosis not present

## 2022-06-18 DIAGNOSIS — R1031 Right lower quadrant pain: Secondary | ICD-10-CM

## 2022-06-18 DIAGNOSIS — K219 Gastro-esophageal reflux disease without esophagitis: Secondary | ICD-10-CM | POA: Diagnosis present

## 2022-06-18 DIAGNOSIS — R509 Fever, unspecified: Secondary | ICD-10-CM

## 2022-06-18 DIAGNOSIS — K3533 Acute appendicitis with perforation and localized peritonitis, with abscess: Secondary | ICD-10-CM | POA: Diagnosis present

## 2022-06-18 DIAGNOSIS — Z801 Family history of malignant neoplasm of trachea, bronchus and lung: Secondary | ICD-10-CM | POA: Diagnosis not present

## 2022-06-18 DIAGNOSIS — N179 Acute kidney failure, unspecified: Secondary | ICD-10-CM | POA: Diagnosis present

## 2022-06-18 DIAGNOSIS — I10 Essential (primary) hypertension: Secondary | ICD-10-CM | POA: Diagnosis present

## 2022-06-18 DIAGNOSIS — Z8249 Family history of ischemic heart disease and other diseases of the circulatory system: Secondary | ICD-10-CM | POA: Diagnosis not present

## 2022-06-18 DIAGNOSIS — E7800 Pure hypercholesterolemia, unspecified: Secondary | ICD-10-CM | POA: Diagnosis present

## 2022-06-18 LAB — CBC WITH DIFFERENTIAL/PLATELET
Abs Immature Granulocytes: 0.19 10*3/uL — ABNORMAL HIGH (ref 0.00–0.07)
Basophils Absolute: 0.1 10*3/uL (ref 0.0–0.1)
Basophils Relative: 0 %
Eosinophils Absolute: 0.1 10*3/uL (ref 0.0–0.5)
Eosinophils Relative: 1 %
HCT: 41 % (ref 39.0–52.0)
Hemoglobin: 14.1 g/dL (ref 13.0–17.0)
Immature Granulocytes: 1 %
Lymphocytes Relative: 12 %
Lymphs Abs: 1.7 10*3/uL (ref 0.7–4.0)
MCH: 32 pg (ref 26.0–34.0)
MCHC: 34.4 g/dL (ref 30.0–36.0)
MCV: 93 fL (ref 80.0–100.0)
Monocytes Absolute: 1.9 10*3/uL — ABNORMAL HIGH (ref 0.1–1.0)
Monocytes Relative: 13 %
Neutro Abs: 10.4 10*3/uL — ABNORMAL HIGH (ref 1.7–7.7)
Neutrophils Relative %: 73 %
Platelets: 402 10*3/uL — ABNORMAL HIGH (ref 150–400)
RBC: 4.41 MIL/uL (ref 4.22–5.81)
RDW: 12.1 % (ref 11.5–15.5)
WBC: 14.4 10*3/uL — ABNORMAL HIGH (ref 4.0–10.5)
nRBC: 0 % (ref 0.0–0.2)

## 2022-06-18 LAB — COMPREHENSIVE METABOLIC PANEL
ALT: 35 U/L (ref 0–44)
AST: 30 U/L (ref 15–41)
Albumin: 3.6 g/dL (ref 3.5–5.0)
Alkaline Phosphatase: 71 U/L (ref 38–126)
Anion gap: 13 (ref 5–15)
BUN: 81 mg/dL — ABNORMAL HIGH (ref 8–23)
CO2: 21 mmol/L — ABNORMAL LOW (ref 22–32)
Calcium: 9.4 mg/dL (ref 8.9–10.3)
Chloride: 99 mmol/L (ref 98–111)
Creatinine, Ser: 2.24 mg/dL — ABNORMAL HIGH (ref 0.61–1.24)
GFR, Estimated: 31 mL/min — ABNORMAL LOW (ref >60)
Glucose, Bld: 123 mg/dL — ABNORMAL HIGH (ref 70–99)
Potassium: 3.9 mmol/L (ref 3.5–5.1)
Sodium: 133 mmol/L — ABNORMAL LOW (ref 135–145)
Total Bilirubin: 0.6 mg/dL (ref 0.3–1.2)
Total Protein: 7.5 g/dL (ref 6.5–8.1)

## 2022-06-18 LAB — URINALYSIS, ROUTINE W REFLEX MICROSCOPIC
Bilirubin Urine: NEGATIVE
Glucose, UA: NEGATIVE mg/dL
Hgb urine dipstick: NEGATIVE
Ketones, ur: NEGATIVE mg/dL
Leukocytes,Ua: NEGATIVE
Nitrite: NEGATIVE
Protein, ur: NEGATIVE mg/dL
Specific Gravity, Urine: 1.026 (ref 1.005–1.030)
pH: 5 (ref 5.0–8.0)

## 2022-06-18 LAB — LACTIC ACID, PLASMA
Lactic Acid, Venous: 1.2 mmol/L (ref 0.5–1.9)
Lactic Acid, Venous: 0.8 mmol/L (ref 0.5–1.9)

## 2022-06-18 MED ORDER — PIPERACILLIN-TAZOBACTAM 3.375 G IVPB 30 MIN
3.3750 g | Freq: Once | INTRAVENOUS | Status: AC
  Administered 2022-06-18: 3.375 g via INTRAVENOUS
  Filled 2022-06-18: qty 50

## 2022-06-18 MED ORDER — MUPIROCIN 2 % EX OINT: 1.0000 | TOPICAL_OINTMENT | Freq: Two times a day (BID) | CUTANEOUS | Status: DC

## 2022-06-18 MED ORDER — ZOLPIDEM TARTRATE 5 MG PO TABS: 5.0000 mg | ORAL_TABLET | Freq: Every evening | ORAL | Status: DC | PRN

## 2022-06-18 MED ORDER — DEXTROSE-NACL 5-0.9 % IV SOLN: INTRAVENOUS | Status: DC

## 2022-06-18 MED ORDER — PIPERACILLIN-TAZOBACTAM 3.375 G IVPB
3.3750 g | Freq: Three times a day (TID) | INTRAVENOUS | Status: DC
  Administered 2022-06-18 – 2022-06-24 (×17): 3.375 g via INTRAVENOUS
  Filled 2022-06-18 (×17): qty 50

## 2022-06-18 MED ORDER — ACETAMINOPHEN 650 MG RE SUPP: 650.0000 mg | Freq: Four times a day (QID) | RECTAL | Status: DC | PRN

## 2022-06-18 MED ORDER — ENOXAPARIN SODIUM 40 MG/0.4ML IJ SOSY
40.0000 mg | PREFILLED_SYRINGE | INTRAMUSCULAR | Status: DC
  Administered 2022-06-18 – 2022-06-22 (×5): 40 mg via SUBCUTANEOUS
  Filled 2022-06-18 (×6): qty 0.4

## 2022-06-18 MED ORDER — ACETAMINOPHEN 325 MG PO TABS: 650.0000 mg | ORAL_TABLET | Freq: Four times a day (QID) | ORAL | Status: DC | PRN

## 2022-06-18 MED ORDER — OXYCODONE HCL 5 MG PO TABS: 5.0000 mg | ORAL_TABLET | ORAL | Status: DC | PRN

## 2022-06-18 MED ORDER — SODIUM CHLORIDE 0.9 % IV SOLN: INTRAVENOUS | Status: DC | PRN

## 2022-06-18 MED ORDER — ATORVASTATIN CALCIUM 40 MG PO TABS
40.0000 mg | ORAL_TABLET | Freq: Every day | ORAL | Status: DC
  Administered 2022-06-19 – 2022-06-24 (×6): 40 mg via ORAL
  Filled 2022-06-18 (×6): qty 1

## 2022-06-18 MED ORDER — SODIUM CHLORIDE 0.9 % IV SOLN: INTRAVENOUS | Status: AC

## 2022-06-18 MED ORDER — ONDANSETRON HCL 4 MG/2ML IJ SOLN: 4.0000 mg | Freq: Four times a day (QID) | INTRAMUSCULAR | Status: DC | PRN

## 2022-06-18 MED ORDER — PANTOPRAZOLE SODIUM 40 MG PO TBEC
40.0000 mg | DELAYED_RELEASE_TABLET | Freq: Every day | ORAL | Status: DC
  Administered 2022-06-18 – 2022-06-24 (×7): 40 mg via ORAL
  Filled 2022-06-18 (×7): qty 1

## 2022-06-18 MED ORDER — IOHEXOL 350 MG/ML SOLN
100.0000 mL | Freq: Once | INTRAVENOUS | Status: AC | PRN
  Administered 2022-06-18: 100 mL via INTRAVENOUS

## 2022-06-18 MED ORDER — METOPROLOL TARTRATE 5 MG/5ML IV SOLN: 5.0000 mg | Freq: Four times a day (QID) | INTRAVENOUS | Status: DC | PRN

## 2022-06-18 MED ORDER — HYDROMORPHONE HCL 2 MG/ML IJ SOLN: 1.0000 mg | INTRAMUSCULAR | Status: DC | PRN

## 2022-06-18 MED ORDER — LISINOPRIL 20 MG PO TABS
20.0000 mg | ORAL_TABLET | Freq: Every day | ORAL | Status: DC
Start: 2022-06-18 — End: 2022-06-24
  Administered 2022-06-19 – 2022-06-24 (×6): 20 mg via ORAL
  Filled 2022-06-18 (×6): qty 1

## 2022-06-18 MED ORDER — ALLOPURINOL 100 MG PO TABS
100.0000 mg | ORAL_TABLET | Freq: Every day | ORAL | Status: DC
  Administered 2022-06-19 – 2022-06-24 (×6): 100 mg via ORAL
  Filled 2022-06-18 (×6): qty 1

## 2022-06-18 MED ORDER — ATENOLOL 25 MG PO TABS
25.0000 mg | ORAL_TABLET | Freq: Every day | ORAL | Status: DC
  Administered 2022-06-19 – 2022-06-24 (×6): 25 mg via ORAL
  Filled 2022-06-18 (×6): qty 1

## 2022-06-18 MED ORDER — ONDANSETRON 4 MG PO TBDP: 4.0000 mg | ORAL_TABLET | Freq: Four times a day (QID) | ORAL | Status: DC | PRN

## 2022-06-18 NOTE — Plan of Care (Signed)
Plan of care discussed.   

## 2022-06-18 NOTE — ED Provider Notes (Signed)
[place] DEPT Provider Note   CSN: [id number] Arrival date & time: 06/18/22  1531     History  Chief Complaint  Patient presents with   Abdominal Pain    Jeremy Bush is a 66 y.o. male.  Patient with history of high blood pressure, high cholesterol presents to the ED for evaluation of right lower quadrant abdominal pain after having CT scan performed as outpatient today demonstrating perforated appendicitis with contained abscess.  Patient began having right lower quadrant abdominal pain 10-12 days ago.  He had fevers at that time.  Since then symptoms have been gradually improving.  No recent fevers.  States that white blood cell count improving as outpatient.  He is eating and drinking well, last ate lunch at 11:30 AM, drank water just prior to arrival.  No history of other abdominal surgeries.      Home Medications Prior to Admission medications   Medication Sig Start Date End Date Taking? Authorizing Provider  acetaminophen (TYLENOL) 325 MG tablet Take 650 mg by mouth every 6 (six) hours as needed.    [provider]  allopurinol (ZYLOPRIM) 100 MG tablet TAKE ONE TABLET BY MOUTH DAILY 05/12/22   Rita Ohara, MD  ALPRAZolam Duanne Moron) 0.5 MG tablet TAKE ONE-HALF TO ONE (0.5-1) TABLET BY MOUTH THREE TIMES A DAY AS NEEDED FOR ANXIETY 04/01/22   Denita Lung, MD  atenolol (TENORMIN) 25 MG tablet TAKE ONE TABLET BY MOUTH DAILY 05/12/22   Rita Ohara, MD  atorvastatin (LIPITOR) 40 MG tablet TAKE ONE TABLET BY MOUTH DAILY 05/12/22   Rita Ohara, MD  lisinopril (ZESTRIL) 20 MG tablet TAKE ONE TABLET BY MOUTH DAILY 05/12/22   Rita Ohara, MD  omeprazole (PRILOSEC) 20 MG capsule Take 20 mg by mouth as needed.    [provider]      Allergies    Sulfa antibiotics    Review of Systems   Review of Systems  Physical Exam Updated Vital Signs BP 120/73 (BP Location: Left Arm)   Pulse 74   Temp (!) 97.5 F (36.4 C) (Oral)   Resp 16    SpO2 99%   Physical Exam Vitals and nursing note reviewed.  Constitutional:      General: He is not in acute distress.    Appearance: He is well-developed.  HENT:     Head: Normocephalic and atraumatic.  Eyes:     General:        Right eye: No discharge.        Left eye: No discharge.     Conjunctiva/sclera: Conjunctivae normal.  Cardiovascular:     Rate and Rhythm: Normal rate and regular rhythm.     Heart sounds: Normal heart sounds.  Pulmonary:     Effort: Pulmonary effort is normal.     Breath sounds: Normal breath sounds.  Abdominal:     Palpations: Abdomen is soft.     Tenderness: There is abdominal tenderness in the right lower quadrant. There is no guarding or rebound. Positive signs include McBurney's sign. Negative signs include Murphy's sign.  Musculoskeletal:     Cervical back: Normal range of motion and neck supple.  Skin:    General: Skin is warm and dry.  Neurological:     Mental Status: He is alert.     ED Results / Procedures / Treatments   Labs (all labs ordered are listed, but only abnormal results are displayed) Labs Reviewed  CBC WITH DIFFERENTIAL/PLATELET - Abnormal; Notable for the following  components:      Result Value   WBC 14.4 (*)    Platelets 402 (*)    Neutro Abs 10.4 (*)    Monocytes Absolute 1.9 (*)    Abs Immature Granulocytes 0.19 (*)    All other components within normal limits  COMPREHENSIVE METABOLIC PANEL - Abnormal; Notable for the following components:   Sodium 133 (*)    CO2 21 (*)    Glucose, Bld 123 (*)    BUN 81 (*)    Creatinine, Ser 2.24 (*)    GFR, Estimated 31 (*)    All other components within normal limits  LACTIC ACID, PLASMA  URINALYSIS, ROUTINE W REFLEX MICROSCOPIC  LACTIC ACID, PLASMA    EKG None  Radiology CT Abdomen Pelvis W Contrast  Result Date: 06/18/2022 CLINICAL DATA:  RLQ abdominal pain (Age >= 14y) EXAM: CT ABDOMEN AND PELVIS WITH CONTRAST TECHNIQUE: Multidetector CT imaging of the abdomen  and pelvis was performed using the standard protocol following bolus administration of intravenous contrast. RADIATION DOSE REDUCTION: This exam was performed according to the departmental dose-optimization program which includes automated exposure control, adjustment of the mA and/or kV according to patient size and/or use of iterative reconstruction technique. CONTRAST:  121m OMNIPAQUE IOHEXOL 350 MG/ML SOLN COMPARISON:  None Available. FINDINGS: Lower chest: No acute abnormality. Hepatobiliary: No focal liver abnormality is seen. No gallstones, gallbladder wall thickening, or biliary dilatation. Pancreas: Unremarkable. No pancreatic ductal dilatation or surrounding inflammatory changes. Spleen: Unremarkable. Adrenals/Urinary Tract: Adrenals are unremarkable. Small cyst at the upper pole the right kidney. Bladder is unremarkable. Stomach/Bowel: Stomach is within normal limits. Bowel is normal in caliber. There is a 3.3 x 3.6 x 3.2 cm area of fat infiltration with a well-defined wall contiguous with and irregular proximal appendix. Infiltration of adjacent fat. Vascular/Lymphatic: Atherosclerosis.  No enlarged nodes. Reproductive: Prostate radiation seeds. Other: Small volume free fluid in the dependent pelvis. Abdominal wall is unremarkable. Musculoskeletal: Chronic L5 pars breaks with grade 1 anterolisthesis at L5-S1. Foraminal stenosis at this level. IMPRESSION: Ruptured appendicitis with probable developing abscess. These results will be called to the ordering clinician or representative by the Radiologist Assistant, and communication documented in the PACS or [employer] Electronically Signed   By: PMacy MisM.D.   On: 06/18/2022 11:17    Procedures Procedures    Medications Ordered in ED Medications  piperacillin-tazobactam (ZOSYN) IVPB 3.375 g (3.375 g Intravenous New Bag/Given 06/18/22 1604)    ED Course/ Medical Decision Making/ A&P    Patient seen and examined. History obtained  directly from patient. Work-up including labs, imaging, EKG ordered in triage, if performed, were reviewed.    Labs/EKG: Independently reviewed and interpreted.  This included: CBC with differential demonstrating white blood cell count 14.4, neutrophil count 10.4 otherwise unremarkable.  CMP, lipase pending.  Imaging: Independently visualized and interpreted.  This included: CT abdomen pelvis ordered as outpatient, agree acute appendicitis with suspected abscess.  Medications/Fluids: Ordered: IV Zosyn.   Most recent vital signs reviewed and are as follows: BP 120/73 (BP Location: Left Arm)   Pulse 74   Temp (!) 97.5 F (36.4 C) (Oral)   Resp 16   SpO2 99%   Initial impression: Acute appendicitis with complication.  4:19 PM consulted with APP Kabrich with general surgery who will see.  5:00 PM surgery to admit patient.  Labs personally reviewed and interpreted including: CMP creatinine 2.24 and BUN 81; lactate 1.2.  Medical Decision Making Risk Prescription drug management.   For this patient's complaint of abdominal pain, the following conditions were considered on the differential diagnosis: gastritis/PUD, enteritis/duodenitis, appendicitis, cholelithiasis/cholecystitis, cholangitis, pancreatitis, ruptured viscus, colitis, diverticulitis, small/large bowel obstruction, proctitis, cystitis, pyelonephritis, ureteral colic, aortic dissection, aortic aneurysm. Atypical chest etiologies were also considered including ACS, PE, and pneumonia.   The patient's vital signs, pertinent lab work and imaging were reviewed and interpreted as discussed in the ED course. Hospitalization was considered for further testing, treatments, or serial exams/observation. However as patient is well-appearing, has a stable exam, and reassuring studies today, I do not feel that they warrant admission at this time. This plan was discussed with the patient who verbalizes agreement and  comfort with this plan and seems reliable and able to return to the Emergency Department with worsening or changing symptoms.           Final Clinical Impression(s) / ED Diagnoses Final diagnoses:  Acute appendicitis with perforation, localized peritonitis, and abscess, without gangrene    Rx / DC Orders ED Discharge Orders     None         Carlisle Cater, Hershal Coria 06/18/22 1708    Dorie Rank, MD 06/18/22 2228

## 2022-06-18 NOTE — Telephone Encounter (Signed)
Spoke with Dr. Michaelle Birks (from [facility]). She stated unable to do direct admit due to issues with beds--best to send to ER. Will need IV ABX to start, needs admission.  She looked at [facility], [facility] less busy. Will contact pt and advise of need to go to ER ([facility]) due to ruptured appendicitis.  1:20 LMOVM. Spoke with pt at 1:25, advised to go to ER for admission

## 2022-06-18 NOTE — H&P (Signed)
Admission Note  Jeremy Bush September 21, 1956  [id number].    Requesting MD: Dr. Darl Householder Chief Complaint/Reason for Consult: Ruptured appendicitis HPI:  66 y.o. male with medical history significant for hypertension prostate cancer status post radioactive seed placement who presented to [facility] emergency department with appendicitis.  He states he had severe abdominal pain, fever, chills, diarrhea that began 10 to 14 days ago.  This was worst at night but got better over time however symptoms did not completely resolve prompting his presentation to his family medicine provider.  Work-up showed elevated white blood cell count and so a CT scan was ordered which showed a ruptured appendicitis.  He was encouraged to come to the emergency department for further appointment.  He denies nausea and vomiting.   Additionally he has had a cough.  No shortness of breath.  He has tested negative for COVID at home and at his PCP office  Last oral intake was food at 1130 and he has been drinking water up to presentation to the emergency department this afternoon  Substance use: None Allergies: Sulfa antibiotics Blood thinners: none Past Surgeries: No prior abdominal surgeries He is retired   ROS: Review of Systems  Constitutional:  Positive for chills and fever.  Respiratory:  Positive for cough. Negative for shortness of breath.   Cardiovascular:  Negative for chest pain and palpitations.  Gastrointestinal:  Positive for abdominal pain and diarrhea. Negative for constipation, nausea and vomiting.  Genitourinary: Negative.     Family History  Problem Relation Age of Onset   Cancer Mother        lung (smoker)   Hypertension Mother    Hyperlipidemia Mother    Stroke Father        mini-stroke   Hypertension Father    Kidney disease Father        on dialysis   Diabetes Father    Healthy Sister     Past Medical History:  Diagnosis Date   Anxiety    related to work stress    Colonic polyp 2007, 2013   Elevated uric acid in blood    with possible gout flare x 2   Hyperlipidemia    Hypertension    Nocturia    Prostate cancer ([place]) 03/24/12   gleason 6, vol 31.3 cc    Past Surgical History:  Procedure Laterality Date   BIOPSY PROSTATE  03/24/12   gleason 3+3=6/prostate volume 31 cc's  (md office)   CYSTOSCOPY  08/13/2012   Procedure: CYSTOSCOPY FLEXIBLE;  Surgeon: Claybon Jabs, MD;  Location: [facility];  Service: Urology;  Laterality: N/A;  no seeds found in bladder   RADIOACTIVE SEED IMPLANT  08/13/2012   Procedure: RADIOACTIVE SEED IMPLANT;  Surgeon: Claybon Jabs, MD;  Location: [facility];  Service: Urology;  Laterality: N/A;   79    seeds implanted    Social History:  reports that he has never smoked. He has never used smokeless tobacco. He reports current alcohol use of about 14.0 standard drinks of alcohol per week. He reports that he does not use drugs.  Allergies:  Allergies  Allergen Reactions   Sulfa Antibiotics Rash    (Not in a hospital admission)   Blood pressure 120/73, pulse 74, temperature (!) 97.5 F (36.4 C), temperature source Oral, resp. rate 16, SpO2 99 %. Physical Exam: General: pleasant, WD, male who is laying in bed in NAD HEENT: head is normocephalic, atraumatic.  Sclera  are noninjected.  Pupils equal and round. EOMs intact.  Ears and nose without any masses or lesions.  Mouth is pink and moist Heart: regular, rate, and rhythm.  Normal s1,s2. No obvious murmurs, gallops, or rubs noted.  Palpable radial and pedal pulses bilaterally Lungs: Respiratory effort nonlabored Abd: soft, ND, no masses, hernias, or organomegaly, minimal tenderness with deep palpation of the right lower quadrant without rebound or guarding MSK: all 4 extremities are symmetrical with no cyanosis, clubbing, or edema. Skin: warm and dry with no masses, lesions, or rashes Neuro: Cranial nerves 2-12 grossly intact,  sensation is normal throughout Psych: A&Ox3 with an appropriate affect.    Results for orders placed or performed during the hospital encounter of 06/18/22 (from the past 48 hour(s))  CBC with Differential     Status: Abnormal   Collection Time: 06/18/22  3:51 PM  Result Value Ref Range   WBC 14.4 (H) 4.0 - 10.5 K/uL   RBC 4.41 4.22 - 5.81 MIL/uL   Hemoglobin 14.1 13.0 - 17.0 g/dL   HCT 41.0 39.0 - 52.0 %   MCV 93.0 80.0 - 100.0 fL   MCH 32.0 26.0 - 34.0 pg   MCHC 34.4 30.0 - 36.0 g/dL   RDW 12.1 11.5 - 15.5 %   Platelets 402 (H) 150 - 400 K/uL   nRBC 0.0 0.0 - 0.2 %   Neutrophils Relative % 73 %   Neutro Abs 10.4 (H) 1.7 - 7.7 K/uL   Lymphocytes Relative 12 %   Lymphs Abs 1.7 0.7 - 4.0 K/uL   Monocytes Relative 13 %   Monocytes Absolute 1.9 (H) 0.1 - 1.0 K/uL   Eosinophils Relative 1 %   Eosinophils Absolute 0.1 0.0 - 0.5 K/uL   Basophils Relative 0 %   Basophils Absolute 0.1 0.0 - 0.1 K/uL   Immature Granulocytes 1 %   Abs Immature Granulocytes 0.19 (H) 0.00 - 0.07 K/uL    Comment: Performed at [facility], [place] [address], [place], [place] [address]   CT Abdomen Pelvis W Contrast  Result Date: 06/18/2022 CLINICAL DATA:  RLQ abdominal pain (Age >= 14y) EXAM: CT ABDOMEN AND PELVIS WITH CONTRAST TECHNIQUE: Multidetector CT imaging of the abdomen and pelvis was performed using the standard protocol following bolus administration of intravenous contrast. RADIATION DOSE REDUCTION: This exam was performed according to the departmental dose-optimization program which includes automated exposure control, adjustment of the mA and/or kV according to patient size and/or use of iterative reconstruction technique. CONTRAST:  166m OMNIPAQUE IOHEXOL 350 MG/ML SOLN COMPARISON:  None Available. FINDINGS: Lower chest: No acute abnormality. Hepatobiliary: No focal liver abnormality is seen. No gallstones, gallbladder wall thickening, or biliary dilatation. Pancreas: Unremarkable.  No pancreatic ductal dilatation or surrounding inflammatory changes. Spleen: Unremarkable. Adrenals/Urinary Tract: Adrenals are unremarkable. Small cyst at the upper pole the right kidney. Bladder is unremarkable. Stomach/Bowel: Stomach is within normal limits. Bowel is normal in caliber. There is a 3.3 x 3.6 x 3.2 cm area of fat infiltration with a well-defined wall contiguous with and irregular proximal appendix. Infiltration of adjacent fat. Vascular/Lymphatic: Atherosclerosis.  No enlarged nodes. Reproductive: Prostate radiation seeds. Other: Small volume free fluid in the dependent pelvis. Abdominal wall is unremarkable. Musculoskeletal: Chronic L5 pars breaks with grade 1 anterolisthesis at L5-S1. Foraminal stenosis at this level. IMPRESSION: Ruptured appendicitis with probable developing abscess. These results will be called to the ordering clinician or representative by the Radiologist Assistant, and communication documented in the PACS or [employer] [employer]. Electronically Signed   By: Macy Mis M.D.   On: 06/18/2022 11:17      Assessment/Plan Ruptured appendicitis with developing abscess -IV Zosyn started in the ED, continue this - abscess 3.3 x 3.6 x 3.2 cm - will discuss with IR if amenable to drainage  AKI - IVF  HTN - home medications  FEN: CLD today, NPO MN for possible procedure ID: zosyn VTE: lovenox  I reviewed ED provider notes, last 24 h vitals and pain scores, last 48 h intake and output, last 24 h labs and trends, and last 24 h imaging results.   Winferd Humphrey, [facility] 06/18/2022, 4:18 PM Please see Amion for pager number during day hours 7:00am-4:30pm

## 2022-06-18 NOTE — ED Triage Notes (Signed)
Pt reports abdominal pain and N/V x2 weeks. Pt had CT this morning and has confirmed ruptured appendix.

## 2022-06-18 NOTE — ED Provider Triage Note (Signed)
Emergency Medicine Provider Triage Evaluation Note  Sly Parlee , a 66 y.o. male  was evaluated in triage.  Pt complains of right lower quadrant abdominal pain for about 7 days.  Initially associated fever and chills.  Currently denies fever, chills, nausea, vomiting.  Had CT abdomen pelvis done this morning that confirmed patient has ruptured appendicitis.  Review of Systems  Positive: As above Negative: As above  Physical Exam  There were no vitals taken for this visit. Gen:   Awake, no distress   Resp:  Normal effort MSK:   Moves extremities without difficulty  Other:    Medical Decision Making  Medically screening exam initiated at 3:43 PM.  Appropriate orders placed.  Dashan Chizmar was informed that the remainder of the evaluation will be completed by another provider, this initial triage assessment does not replace that evaluation, and the importance of remaining in the ED until their evaluation is complete.     Evlyn Courier, PA-C 06/18/22 1544

## 2022-06-18 NOTE — Telephone Encounter (Signed)
A tech from [place] called to give a stat report. His CT Abdomen Pelvis showed Ruptured appendicitis with likely developing abscess. They were not done putting it in the system but should be done shortly.

## 2022-06-19 LAB — CBC
HCT: 37.8 % — ABNORMAL LOW (ref 39.0–52.0)
Hemoglobin: 12.9 g/dL — ABNORMAL LOW (ref 13.0–17.0)
MCH: 32.3 pg (ref 26.0–34.0)
MCHC: 34.1 g/dL (ref 30.0–36.0)
MCV: 94.7 fL (ref 80.0–100.0)
Platelets: 388 10*3/uL (ref 150–400)
RBC: 3.99 MIL/uL — ABNORMAL LOW (ref 4.22–5.81)
RDW: 12 % (ref 11.5–15.5)
WBC: 12 10*3/uL — ABNORMAL HIGH (ref 4.0–10.5)
nRBC: 0 % (ref 0.0–0.2)

## 2022-06-19 LAB — COMPREHENSIVE METABOLIC PANEL
ALT: 33 U/L (ref 0–44)
AST: 19 U/L (ref 15–41)
Albumin: 3.3 g/dL — ABNORMAL LOW (ref 3.5–5.0)
Alkaline Phosphatase: 60 U/L (ref 38–126)
Anion gap: 8 (ref 5–15)
BUN: 67 mg/dL — ABNORMAL HIGH (ref 8–23)
CO2: 24 mmol/L (ref 22–32)
Calcium: 8.9 mg/dL (ref 8.9–10.3)
Chloride: 104 mmol/L (ref 98–111)
Creatinine, Ser: 1.77 mg/dL — ABNORMAL HIGH (ref 0.61–1.24)
GFR, Estimated: 41 mL/min — ABNORMAL LOW (ref >60)
Glucose, Bld: 96 mg/dL (ref 70–99)
Potassium: 4.2 mmol/L (ref 3.5–5.1)
Sodium: 136 mmol/L (ref 135–145)
Total Bilirubin: 0.7 mg/dL (ref 0.3–1.2)
Total Protein: 6.7 g/dL (ref 6.5–8.1)

## 2022-06-19 LAB — SURGICAL PCR SCREEN
MRSA, PCR: NEGATIVE
Staphylococcus aureus: NEGATIVE

## 2022-06-19 LAB — PROTIME-INR
INR: 1.1 (ref 0.8–1.2)
Prothrombin Time: 14.5 seconds (ref 11.4–15.2)

## 2022-06-19 LAB — HIV ANTIBODY (ROUTINE TESTING W REFLEX): HIV Screen 4th Generation wRfx: NONREACTIVE

## 2022-06-19 NOTE — Progress Notes (Signed)
Transition of Care [facility]) Screening Note  Patient Details  Name: Jeremy Bush Date of Birth: [birth year]  Transition of Care [facility]) CM/SW Contact:    Sherie Don, LCSW Phone Number: 06/19/2022, 9:45 AM  Transition of Care Department [facility]) has reviewed patient and no TOC needs have been identified at this time. We will continue to monitor patient advancement through interdisciplinary progression rounds. If new patient transition needs arise, please place a TOC consult.

## 2022-06-19 NOTE — Progress Notes (Addendum)
Subjective: Feels the same today.  No pain unless you press on his abdomen.  No nausea or vomiting.  Tolerated CLD last night.    ROS: See above, otherwise other systems negative  Objective: Vital signs in last 24 hours: Temp:  [97.5 F (36.4 C)-98.5 F (36.9 C)] 98.4 F (36.9 C) (08/24 0946) Pulse Rate:  [55-74] 66 (08/24 0946) Resp:  [16-18] 18 (08/24 0946) BP: (106-141)/(64-85) 127/71 (08/24 0946) SpO2:  [95 %-100 %] 99 % (08/24 0946) Weight:  [76.3 kg-78.3 kg] 76.3 kg (08/23 2325) Last BM Date : 06/18/22  Intake/Output from previous day: 08/23 0701 - 08/24 0700 In: 1077.9 [I.V.:1002.7; IV Piggyback:75.2] Out: -  Intake/Output this shift: No intake/output data recorded.  PE: Gen: NAD Lungs: respiratory effort nonlabored Abd: soft, tender in RLQ but no guarding or peritonitis, ND  Lab Results:  Recent Labs    06/18/22 1551 06/19/22 0320  WBC 14.4* 12.0*  HGB 14.1 12.9*  HCT 41.0 37.8*  PLT 402* 388   BMET Recent Labs    06/18/22 1551 06/19/22 0320  NA 133* 136  K 3.9 4.2  CL 99 104  CO2 21* 24  GLUCOSE 123* 96  BUN 81* 67*  CREATININE 2.24* 1.77*  CALCIUM 9.4 8.9   PT/INR Recent Labs    06/19/22 0838  LABPROT 14.5  INR 1.1   CMP     Component Value Date/Time   NA 136 06/19/2022 0320   NA 133 (L) 06/12/2022 1401   K 4.2 06/19/2022 0320   CL 104 06/19/2022 0320   CO2 24 06/19/2022 0320   GLUCOSE 96 06/19/2022 0320   BUN 67 (H) 06/19/2022 0320   BUN 12 06/12/2022 1401   CREATININE 1.77 (H) 06/19/2022 0320   CREATININE 1.06 03/26/2017 0740   CALCIUM 8.9 06/19/2022 0320   PROT 6.7 06/19/2022 0320   PROT 6.9 06/12/2022 1401   ALBUMIN 3.3 (L) 06/19/2022 0320   ALBUMIN 4.6 06/12/2022 1401   AST 19 06/19/2022 0320   ALT 33 06/19/2022 0320   ALKPHOS 60 06/19/2022 0320   BILITOT 0.7 06/19/2022 0320   BILITOT 1.8 (H) 06/12/2022 1401   GFRNONAA 41 (L) 06/19/2022 0320   GFRAA 80 04/19/2020 0822   Lipase  No results found for:  "LIPASE"     Studies/Results: CT Abdomen Pelvis W Contrast  Result Date: 06/18/2022 CLINICAL DATA:  RLQ abdominal pain (Age >= 14y) EXAM: CT ABDOMEN AND PELVIS WITH CONTRAST TECHNIQUE: Multidetector CT imaging of the abdomen and pelvis was performed using the standard protocol following bolus administration of intravenous contrast. RADIATION DOSE REDUCTION: This exam was performed according to the departmental dose-optimization program which includes automated exposure control, adjustment of the mA and/or kV according to patient size and/or use of iterative reconstruction technique. CONTRAST:  132m OMNIPAQUE IOHEXOL 350 MG/ML SOLN COMPARISON:  None Available. FINDINGS: Lower chest: No acute abnormality. Hepatobiliary: No focal liver abnormality is seen. No gallstones, gallbladder wall thickening, or biliary dilatation. Pancreas: Unremarkable. No pancreatic ductal dilatation or surrounding inflammatory changes. Spleen: Unremarkable. Adrenals/Urinary Tract: Adrenals are unremarkable. Small cyst at the upper pole the right kidney. Bladder is unremarkable. Stomach/Bowel: Stomach is within normal limits. Bowel is normal in caliber. There is a 3.3 x 3.6 x 3.2 cm area of fat infiltration with a well-defined wall contiguous with and irregular proximal appendix. Infiltration of adjacent fat. Vascular/Lymphatic: Atherosclerosis.  No enlarged nodes. Reproductive: Prostate radiation seeds. Other: Small volume free fluid in the dependent pelvis. Abdominal wall  is unremarkable. Musculoskeletal: Chronic L5 pars breaks with grade 1 anterolisthesis at L5-S1. Foraminal stenosis at this level. IMPRESSION: Ruptured appendicitis with probable developing abscess. These results will be called to the ordering clinician or representative by the Radiologist Assistant, and communication documented in the PACS or [employer]. Electronically Signed   By: Macy Mis M.D.   On: 06/18/2022 11:17     Anti-infectives: Anti-infectives (From admission, onward)    Start     Dose/Rate Route Frequency Ordered Stop   06/18/22 2100  piperacillin-tazobactam (ZOSYN) IVPB 3.375 g        3.375 g 12.5 mL/hr over 240 Minutes Intravenous Every 8 hours 06/18/22 2056 06/25/22 2359   06/18/22 1600  piperacillin-tazobactam (ZOSYN) IVPB 3.375 g        3.375 g 100 mL/hr over 30 Minutes Intravenous  Once 06/18/22 1558 06/18/22 1634        Assessment/Plan Perforated appendicitis with abscess -awaiting IR eval for abscess drainage -cont IV abx therapy and conservative management for now -CLD ok to follow IR procedure if able to proceed -mobilize, pulm toilet -WBC downtrending -discussed reason for conservative management vs operative intervention and why we do this and when he may need surgery etc.  Patient and family member at bedside understand and agree with thsi plan.   FEN - NPO/IVFs VTE - lovenox ID - zosyn  HTN - home meds AKI - improving, continue hydration HLD  I reviewed last 24 h vitals and pain scores, last 48 h intake and output, last 24 h labs and trends, and last 24 h imaging results.   LOS: 1 day    Henreitta Cea , [facility] 06/19/2022, 10:36 AM Please see Amion for pager number during day hours 7:00am-4:30pm or 7:00am -11:30am on weekends

## 2022-06-19 NOTE — Progress Notes (Signed)
Request received for appendiceal abscess drain. After review, Dr. Maryelizabeth Kaufmann states that this is not a completely formed abscess with questionable underlying mass.  Dr. Maryelizabeth Kaufmann spoke vis phone with Saverio Danker, PA, to notify of findings.     Narda Rutherford, AGNP-BC 06/19/2022, 4:25 PM

## 2022-06-20 LAB — CBC
HCT: 40.4 % (ref 39.0–52.0)
Hemoglobin: 13.3 g/dL (ref 13.0–17.0)
MCH: 31.7 pg (ref 26.0–34.0)
MCHC: 32.9 g/dL (ref 30.0–36.0)
MCV: 96.4 fL (ref 80.0–100.0)
Platelets: 507 10*3/uL — ABNORMAL HIGH (ref 150–400)
RBC: 4.19 MIL/uL — ABNORMAL LOW (ref 4.22–5.81)
RDW: 12.3 % (ref 11.5–15.5)
WBC: 13.6 10*3/uL — ABNORMAL HIGH (ref 4.0–10.5)
nRBC: 0 % (ref 0.0–0.2)

## 2022-06-20 LAB — BASIC METABOLIC PANEL
Anion gap: 8 (ref 5–15)
BUN: 37 mg/dL — ABNORMAL HIGH (ref 8–23)
CO2: 25 mmol/L (ref 22–32)
Calcium: 9.4 mg/dL (ref 8.9–10.3)
Chloride: 107 mmol/L (ref 98–111)
Creatinine, Ser: 1.41 mg/dL — ABNORMAL HIGH (ref 0.61–1.24)
GFR, Estimated: 54 mL/min — ABNORMAL LOW (ref >60)
Glucose, Bld: 103 mg/dL — ABNORMAL HIGH (ref 70–99)
Potassium: 5.2 mmol/L — ABNORMAL HIGH (ref 3.5–5.1)
Sodium: 140 mmol/L (ref 135–145)

## 2022-06-20 MED ORDER — LACTATED RINGERS IV BOLUS: 1000.0000 mL | Freq: Three times a day (TID) | INTRAVENOUS | Status: DC | PRN

## 2022-06-20 MED ORDER — SODIUM CHLORIDE 0.9 % IV SOLN
250.0000 mL | INTRAVENOUS | Status: DC | PRN
  Administered 2022-06-23: 100 mL via INTRAVENOUS

## 2022-06-20 MED ORDER — SODIUM CHLORIDE 0.9% FLUSH
3.0000 mL | Freq: Two times a day (BID) | INTRAVENOUS | Status: DC
  Administered 2022-06-23 – 2022-06-24 (×3): 3 mL via INTRAVENOUS

## 2022-06-20 MED ORDER — SODIUM CHLORIDE 0.9% FLUSH: 3.0000 mL | INTRAVENOUS | Status: DC | PRN

## 2022-06-20 NOTE — Progress Notes (Signed)
Pt being transferred to 1340.  Pt remains alert and oriented.  Report given to Rita,RN.

## 2022-06-20 NOTE — Progress Notes (Signed)
Subjective: Minimal pain, did fine with CLD, walking the halls    ROS: See above, otherwise other systems negative  Objective: Vital signs in last 24 hours: Temp:  [98.1 F (36.7 C)-98.6 F (37 C)] 98.4 F (36.9 C) (08/25 0523) Pulse Rate:  [53-57] 53 (08/25 0523) Resp:  [16-18] 18 (08/25 0523) BP: (96-122)/(57-75) 122/75 (08/25 0523) SpO2:  [99 %-100 %] 99 % (08/25 0523) Last BM Date : 06/19/22  Intake/Output from previous day: 08/24 0701 - 08/25 0700 In: 440 [P.O.:240; I.V.:100; IV Piggyback:100] Out: -  Intake/Output this shift: Total I/O In: 640 [P.O.:640] Out: -   PE: Gen: NAD Lungs: respiratory effort nonlabored Abd: soft, minimally tender in RLQ, no guarding or peritonitis, ND  Lab Results:  Recent Labs    06/18/22 1551 06/19/22 0320  WBC 14.4* 12.0*  HGB 14.1 12.9*  HCT 41.0 37.8*  PLT 402* 388   BMET Recent Labs    06/18/22 1551 06/19/22 0320  NA 133* 136  K 3.9 4.2  CL 99 104  CO2 21* 24  GLUCOSE 123* 96  BUN 81* 67*  CREATININE 2.24* 1.77*  CALCIUM 9.4 8.9   PT/INR Recent Labs    06/19/22 0838  LABPROT 14.5  INR 1.1   CMP     Component Value Date/Time   NA 136 06/19/2022 0320   NA 133 (L) 06/12/2022 1401   K 4.2 06/19/2022 0320   CL 104 06/19/2022 0320   CO2 24 06/19/2022 0320   GLUCOSE 96 06/19/2022 0320   BUN 67 (H) 06/19/2022 0320   BUN 12 06/12/2022 1401   CREATININE 1.77 (H) 06/19/2022 0320   CREATININE 1.06 03/26/2017 0740   CALCIUM 8.9 06/19/2022 0320   PROT 6.7 06/19/2022 0320   PROT 6.9 06/12/2022 1401   ALBUMIN 3.3 (L) 06/19/2022 0320   ALBUMIN 4.6 06/12/2022 1401   AST 19 06/19/2022 0320   ALT 33 06/19/2022 0320   ALKPHOS 60 06/19/2022 0320   BILITOT 0.7 06/19/2022 0320   BILITOT 1.8 (H) 06/12/2022 1401   GFRNONAA 41 (L) 06/19/2022 0320   GFRAA 80 04/19/2020 0822   Lipase  No results found for: "LIPASE"     Studies/Results: CT Abdomen Pelvis W Contrast  Result Date: 06/18/2022 CLINICAL  DATA:  RLQ abdominal pain (Age >= 14y) EXAM: CT ABDOMEN AND PELVIS WITH CONTRAST TECHNIQUE: Multidetector CT imaging of the abdomen and pelvis was performed using the standard protocol following bolus administration of intravenous contrast. RADIATION DOSE REDUCTION: This exam was performed according to the departmental dose-optimization program which includes automated exposure control, adjustment of the mA and/or kV according to patient size and/or use of iterative reconstruction technique. CONTRAST:  130m OMNIPAQUE IOHEXOL 350 MG/ML SOLN COMPARISON:  None Available. FINDINGS: Lower chest: No acute abnormality. Hepatobiliary: No focal liver abnormality is seen. No gallstones, gallbladder wall thickening, or biliary dilatation. Pancreas: Unremarkable. No pancreatic ductal dilatation or surrounding inflammatory changes. Spleen: Unremarkable. Adrenals/Urinary Tract: Adrenals are unremarkable. Small cyst at the upper pole the right kidney. Bladder is unremarkable. Stomach/Bowel: Stomach is within normal limits. Bowel is normal in caliber. There is a 3.3 x 3.6 x 3.2 cm area of fat infiltration with a well-defined wall contiguous with and irregular proximal appendix. Infiltration of adjacent fat. Vascular/Lymphatic: Atherosclerosis.  No enlarged nodes. Reproductive: Prostate radiation seeds. Other: Small volume free fluid in the dependent pelvis. Abdominal wall is unremarkable. Musculoskeletal: Chronic L5 pars breaks with grade 1 anterolisthesis at L5-S1. Foraminal stenosis at this level.  IMPRESSION: Ruptured appendicitis with probable developing abscess. These results will be called to the ordering clinician or representative by the Radiologist Assistant, and communication documented in the PACS or [employer]. Electronically Signed   By: Macy Mis M.D.   On: 06/18/2022 11:17    Anti-infectives: Anti-infectives (From admission, onward)    Start     Dose/Rate Route Frequency Ordered Stop   06/18/22 2100   piperacillin-tazobactam (ZOSYN) IVPB 3.375 g        3.375 g 12.5 mL/hr over 240 Minutes Intravenous Every 8 hours 06/18/22 2056 06/25/22 2359   06/18/22 1600  piperacillin-tazobactam (ZOSYN) IVPB 3.375 g        3.375 g 100 mL/hr over 30 Minutes Intravenous  Once 06/18/22 1558 06/18/22 1634        Assessment/Plan Perforated appendicitis with abscess -IR feels this area is more phlegmonous rather than abscess, vs something underlying but can't be sure. -cont IV abx therapy and conservative management for now -regular diet -mobilize, pulm toilet -WBC downtrending, repeat labs pending today -discussed plan for continued conservative management now with just IV abx therapy to help with this fluid collection.  Possible repeat CT scan in several days if needed, but if continues to improve, may just treat clinically.  He will need outpatient c-scope in 4-6 weeks and then likely interval appy, etc pending findings.  FEN - regular/IVFs VTE - lovenox ID - zosyn  HTN - home meds AKI - improving, continue hydration until repeat BMET returns today.  If improved then can consider SLIV HLD  I reviewed last 24 h vitals and pain scores, last 48 h intake and output, last 24 h labs and trends, and last 24 h imaging results.   LOS: 2 days    Jeremy Bush , [facility] 06/20/2022, 10:46 AM Please see Amion for pager number during day hours 7:00am-4:30pm or 7:00am -11:30am on weekends

## 2022-06-20 NOTE — Plan of Care (Signed)

## 2022-06-21 LAB — CBC
HCT: 36.6 % — ABNORMAL LOW (ref 39.0–52.0)
Hemoglobin: 12.3 g/dL — ABNORMAL LOW (ref 13.0–17.0)
MCH: 32.3 pg (ref 26.0–34.0)
MCHC: 33.6 g/dL (ref 30.0–36.0)
MCV: 96.1 fL (ref 80.0–100.0)
Platelets: 457 10*3/uL — ABNORMAL HIGH (ref 150–400)
RBC: 3.81 MIL/uL — ABNORMAL LOW (ref 4.22–5.81)
RDW: 12.2 % (ref 11.5–15.5)
WBC: 14.4 10*3/uL — ABNORMAL HIGH (ref 4.0–10.5)
nRBC: 0 % (ref 0.0–0.2)

## 2022-06-21 LAB — BASIC METABOLIC PANEL
Anion gap: 7 (ref 5–15)
BUN: 24 mg/dL — ABNORMAL HIGH (ref 8–23)
CO2: 26 mmol/L (ref 22–32)
Calcium: 8.8 mg/dL — ABNORMAL LOW (ref 8.9–10.3)
Chloride: 106 mmol/L (ref 98–111)
Creatinine, Ser: 1.35 mg/dL — ABNORMAL HIGH (ref 0.61–1.24)
GFR, Estimated: 57 mL/min — ABNORMAL LOW (ref >60)
Glucose, Bld: 102 mg/dL — ABNORMAL HIGH (ref 70–99)
Potassium: 4.3 mmol/L (ref 3.5–5.1)
Sodium: 139 mmol/L (ref 135–145)

## 2022-06-21 MED ORDER — HYDROMORPHONE HCL 1 MG/ML IJ SOLN: 1.0000 mg | INTRAMUSCULAR | Status: DC | PRN

## 2022-06-21 NOTE — Progress Notes (Signed)
   Subjective/Chief Complaint: Only hurts when palpate rlq, tol diet, no n/v, some loose stools and flatus   Objective: Vital signs in last 24 hours: Temp:  [97.5 F (36.4 C)-99.1 F (37.3 C)] 99.1 F (37.3 C) (08/26 0448) Pulse Rate:  [54-70] 66 (08/26 0448) Resp:  [18-19] 18 (08/26 0448) BP: (103-145)/(72-85) 145/85 (08/26 0448) SpO2:  [98 %-100 %] 100 % (08/26 0448) Last BM Date : 06/20/22  Intake/Output from previous day: 08/25 0701 - 08/26 0700 In: 1330 [P.O.:1330] Out: -  Intake/Output this shift: Total I/O In: 120 [P.O.:120] Out: -   Abd soft minimally tender rlq nondistended  Lab Results:  Recent Labs    06/20/22 1112 06/21/22 0524  WBC 13.6* 14.4*  HGB 13.3 12.3*  HCT 40.4 36.6*  PLT 507* 457*   BMET Recent Labs    06/20/22 1112 06/21/22 0524  NA 140 139  K 5.2* 4.3  CL 107 106  CO2 25 26  GLUCOSE 103* 102*  BUN 37* 24*  CREATININE 1.41* 1.35*  CALCIUM 9.4 8.8*   PT/INR Recent Labs    06/19/22 0838  LABPROT 14.5  INR 1.1   ABG No results for input(s): "PHART", "HCO3" in the last 72 hours.  Invalid input(s): "PCO2", "PO2"  Studies/Results: No results found.  Anti-infectives: Anti-infectives (From admission, onward)    Start     Dose/Rate Route Frequency Ordered Stop   06/18/22 2100  piperacillin-tazobactam (ZOSYN) IVPB 3.375 g        3.375 g 12.5 mL/hr over 240 Minutes Intravenous Every 8 hours 06/18/22 2056 06/25/22 2359   06/18/22 1600  piperacillin-tazobactam (ZOSYN) IVPB 3.375 g        3.375 g 100 mL/hr over 30 Minutes Intravenous  Once 06/18/22 1558 06/18/22 1634       Assessment/Plan: Perforated appendicitis with abscess vs  underlying mass -IR feels this area is more phlegmonous rather than abscess, vs something underlying but can't be sure. -cont IV abx therapy and conservative management for now -regular diet -mobilize, pulm toilet -WBC stable, may repeat ct tomorrow if not a lot better -discussed plan for  continued conservative management now with just IV abx therapy to help with this fluid collection. He will need outpatient c-scope in 4-6 week improves   FEN - regular/IVFs VTE - lovenox ID - zosyn   HTN - home meds AKI - improving    Rolm Bookbinder 06/21/2022

## 2022-06-22 ENCOUNTER — Inpatient Hospital Stay (HOSPITAL_COMMUNITY): Payer: Medicare Other

## 2022-06-22 LAB — CBC
HCT: 37.4 % — ABNORMAL LOW (ref 39.0–52.0)
Hemoglobin: 12.4 g/dL — ABNORMAL LOW (ref 13.0–17.0)
MCH: 31.8 pg (ref 26.0–34.0)
MCHC: 33.2 g/dL (ref 30.0–36.0)
MCV: 95.9 fL (ref 80.0–100.0)
Platelets: 472 10*3/uL — ABNORMAL HIGH (ref 150–400)
RBC: 3.9 MIL/uL — ABNORMAL LOW (ref 4.22–5.81)
RDW: 12.2 % (ref 11.5–15.5)
WBC: 16.1 10*3/uL — ABNORMAL HIGH (ref 4.0–10.5)
nRBC: 0 % (ref 0.0–0.2)

## 2022-06-22 MED ORDER — IOHEXOL 9 MG/ML PO SOLN
ORAL | Status: AC
  Administered 2022-06-22: 500 mL
  Filled 2022-06-22: qty 1000

## 2022-06-22 MED ORDER — SODIUM CHLORIDE (PF) 0.9 % IJ SOLN
INTRAMUSCULAR | Status: AC
  Filled 2022-06-22: qty 50

## 2022-06-22 MED ORDER — IOHEXOL 300 MG/ML  SOLN
100.0000 mL | Freq: Once | INTRAMUSCULAR | Status: AC | PRN
  Administered 2022-06-22: 100 mL via INTRAVENOUS

## 2022-06-22 NOTE — Progress Notes (Signed)
   Subjective/Chief Complaint: Only hurts when palpate rlq, tol diet, no n/v, some loose stools and flatus     Objective: Vital signs in last 24 hours: Temp:  [97.5 F (36.4 C)-98.1 F (36.7 C)] 97.9 F (36.6 C) (08/27 0543) Pulse Rate:  [58-62] 59 (08/27 0543) Resp:  [16-18] 16 (08/27 0543) BP: (121-139)/(79-88) 139/88 (08/27 0543) SpO2:  [100 %] 100 % (08/27 0543) Last BM Date : 06/21/22  Intake/Output from previous day: 08/26 0701 - 08/27 0700 In: 1116.8 [P.O.:600; I.V.:141; IV Piggyback:375.7] Out: 0  Intake/Output this shift: No intake/output data recorded.  Abd soft minimally tender rlq nondistended  Lab Results:  Recent Labs    06/21/22 0524 06/22/22 0446  WBC 14.4* 16.1*  HGB 12.3* 12.4*  HCT 36.6* 37.4*  PLT 457* 472*   BMET Recent Labs    06/20/22 1112 06/21/22 0524  NA 140 139  K 5.2* 4.3  CL 107 106  CO2 25 26  GLUCOSE 103* 102*  BUN 37* 24*  CREATININE 1.41* 1.35*  CALCIUM 9.4 8.8*   PT/INR No results for input(s): "LABPROT", "INR" in the last 72 hours. ABG No results for input(s): "PHART", "HCO3" in the last 72 hours.  Invalid input(s): "PCO2", "PO2"  Studies/Results: No results found.  Anti-infectives: Anti-infectives (From admission, onward)    Start     Dose/Rate Route Frequency Ordered Stop   06/18/22 2100  piperacillin-tazobactam (ZOSYN) IVPB 3.375 g        3.375 g 12.5 mL/hr over 240 Minutes Intravenous Every 8 hours 06/18/22 2056 06/25/22 2359   06/18/22 1600  piperacillin-tazobactam (ZOSYN) IVPB 3.375 g        3.375 g 100 mL/hr over 30 Minutes Intravenous  Once 06/18/22 1558 06/18/22 1634       Assessment/Plan: Perforated appendicitis with abscess vs  underlying mass -IR feels this area is more phlegmonous rather than abscess, vs something underlying but can't be sure. -cont IV abx therapy and conservative management for now -regular diet -mobilize, pulm toilet -WBC up and has been number of days, will repeat ct  scan today to see if there is abscess.  May need drain or may just need an operation for this pending ct scan -he has csc in chart from 2019, states had one couple years ago and was told he had ten years   FEN - regular/IVFs VTE - lovenox ID - zosyn   HTN - home meds AKI - improving as of yesterday, will recheck in am   I reviewed last 24 h vitals and pain scores, last 48 h intake and output, last 24 h labs and trends, and last 24 h imaging results.  This care required moderate level of medical decision making.   Rolm Bookbinder 06/22/2022

## 2022-06-22 NOTE — Progress Notes (Addendum)
IR was requested for image guided aspiration/drain placement.   Case was reviewed by Dr. Annamaria Boots, approved for CT guided aspiration and possible drain placement with sedation.    Made NPO at [place], Lovenox held. Formal consult to follow tomorrow.  Please call IR for questions and concerns.   Armando Gang Aakash Hollomon PA-C 06/22/2022 2:36 PM

## 2022-06-23 ENCOUNTER — Encounter (HOSPITAL_COMMUNITY): Payer: Self-pay

## 2022-06-23 ENCOUNTER — Inpatient Hospital Stay (HOSPITAL_COMMUNITY): Payer: Medicare Other

## 2022-06-23 LAB — BASIC METABOLIC PANEL
Anion gap: 8 (ref 5–15)
BUN: 17 mg/dL (ref 8–23)
CO2: 27 mmol/L (ref 22–32)
Calcium: 9.2 mg/dL (ref 8.9–10.3)
Chloride: 107 mmol/L (ref 98–111)
Creatinine, Ser: 1.42 mg/dL — ABNORMAL HIGH (ref 0.61–1.24)
GFR, Estimated: 54 mL/min — ABNORMAL LOW (ref >60)
Glucose, Bld: 101 mg/dL — ABNORMAL HIGH (ref 70–99)
Potassium: 4.4 mmol/L (ref 3.5–5.1)
Sodium: 142 mmol/L (ref 135–145)

## 2022-06-23 LAB — CBC
HCT: 37.2 % — ABNORMAL LOW (ref 39.0–52.0)
Hemoglobin: 12.4 g/dL — ABNORMAL LOW (ref 13.0–17.0)
MCH: 31.9 pg (ref 26.0–34.0)
MCHC: 33.3 g/dL (ref 30.0–36.0)
MCV: 95.6 fL (ref 80.0–100.0)
Platelets: 474 10*3/uL — ABNORMAL HIGH (ref 150–400)
RBC: 3.89 MIL/uL — ABNORMAL LOW (ref 4.22–5.81)
RDW: 12.2 % (ref 11.5–15.5)
WBC: 12.2 10*3/uL — ABNORMAL HIGH (ref 4.0–10.5)
nRBC: 0 % (ref 0.0–0.2)

## 2022-06-23 MED ORDER — FENTANYL CITRATE (PF) 100 MCG/2ML IJ SOLN
INTRAMUSCULAR | Status: AC | PRN
  Administered 2022-06-23 (×4): 50 ug via INTRAVENOUS

## 2022-06-23 MED ORDER — NALOXONE HCL 0.4 MG/ML IJ SOLN
INTRAMUSCULAR | Status: AC
  Filled 2022-06-23: qty 1

## 2022-06-23 MED ORDER — LACTATED RINGERS IV SOLN: INTRAVENOUS | Status: DC

## 2022-06-23 MED ORDER — MIDAZOLAM HCL 2 MG/2ML IJ SOLN
INTRAMUSCULAR | Status: AC
  Filled 2022-06-23: qty 4

## 2022-06-23 MED ORDER — FLUMAZENIL 0.5 MG/5ML IV SOLN
INTRAVENOUS | Status: AC
  Filled 2022-06-23: qty 5

## 2022-06-23 MED ORDER — MIDAZOLAM HCL 2 MG/2ML IJ SOLN
INTRAMUSCULAR | Status: AC | PRN
  Administered 2022-06-23 (×2): 1 mg via INTRAVENOUS

## 2022-06-23 MED ORDER — FENTANYL CITRATE (PF) 100 MCG/2ML IJ SOLN
INTRAMUSCULAR | Status: AC
  Filled 2022-06-23: qty 4

## 2022-06-23 NOTE — Care Management Important Message (Signed)
Important Message  Patient Details IM Letter given to the Patient. Name: Jeremy Bush MRN: [id number] Date of Birth: [birth year]   Medicare Important Message Given:  Yes     Kerin Salen 06/23/2022, 2:52 PM

## 2022-06-23 NOTE — Progress Notes (Signed)
[facility] Progress Note     Subjective: CC-  Mild RLQ pain with palpation, otherwise doing fine. Denies n/v. Was tolerating diet yesterday. Had a few loose stools yesterday. WBC down 12.2, afebrile  Objective: Vital signs in last 24 hours: Temp:  [98 F (36.7 C)-98.1 F (36.7 C)] 98 F (36.7 C) (08/27 2211) Pulse Rate:  [52-60] 52 (08/28 0433) Resp:  [16-18] 18 (08/28 0433) BP: (123-131)/(74-83) 131/80 (08/28 0433) SpO2:  [100 %] 100 % (08/28 0433) Last BM Date : 06/21/22  Intake/Output from previous day: 08/27 0701 - 08/28 0700 In: 1747.5 [P.O.:1560; IV Piggyback:187.5] Out: 0  Intake/Output this shift: No intake/output data recorded.  PE: Gen:  Alert, NAD, pleasant Pulm: rate and effort normal on room air Abd: soft, ND, mild RLQ TTP without rebound or guarding  Lab Results:  Recent Labs    06/22/22 0446 06/23/22 0409  WBC 16.1* 12.2*  HGB 12.4* 12.4*  HCT 37.4* 37.2*  PLT 472* 474*   BMET Recent Labs    06/21/22 0524 06/23/22 0409  NA 139 142  K 4.3 4.4  CL 106 107  CO2 26 27  GLUCOSE 102* 101*  BUN 24* 17  CREATININE 1.35* 1.42*  CALCIUM 8.8* 9.2   PT/INR No results for input(s): "LABPROT", "INR" in the last 72 hours. CMP     Component Value Date/Time   NA 142 06/23/2022 0409   NA 133 (L) 06/12/2022 1401   K 4.4 06/23/2022 0409   CL 107 06/23/2022 0409   CO2 27 06/23/2022 0409   GLUCOSE 101 (H) 06/23/2022 0409   BUN 17 06/23/2022 0409   BUN 12 06/12/2022 1401   CREATININE 1.42 (H) 06/23/2022 0409   CREATININE 1.06 03/26/2017 0740   CALCIUM 9.2 06/23/2022 0409   PROT 6.7 06/19/2022 0320   PROT 6.9 06/12/2022 1401   ALBUMIN 3.3 (L) 06/19/2022 0320   ALBUMIN 4.6 06/12/2022 1401   AST 19 06/19/2022 0320   ALT 33 06/19/2022 0320   ALKPHOS 60 06/19/2022 0320   BILITOT 0.7 06/19/2022 0320   BILITOT 1.8 (H) 06/12/2022 1401   GFRNONAA 54 (L) 06/23/2022 0409   GFRAA 80 04/19/2020 0822   Lipase  No results found for:  "LIPASE"     Studies/Results: CT ABDOMEN PELVIS W CONTRAST  Result Date: 06/22/2022 CLINICAL DATA:  Worsening right lower quadrant pain. Follow-up appendicitis and abscess. Prostate carcinoma. * Tracking Code: BO * EXAM: CT ABDOMEN AND PELVIS WITH CONTRAST TECHNIQUE: Multidetector CT imaging of the abdomen and pelvis was performed using the standard protocol following bolus administration of intravenous contrast. RADIATION DOSE REDUCTION: This exam was performed according to the departmental dose-optimization program which includes automated exposure control, adjustment of the mA and/or kV according to patient size and/or use of iterative reconstruction technique. CONTRAST:  12m OMNIPAQUE IOHEXOL 300 MG/ML  SOLN COMPARISON:  06/18/2022 FINDINGS: Lower Chest: No acute findings. Hepatobiliary: No hepatic masses identified. Gallbladder is unremarkable. No evidence of biliary ductal dilatation. Pancreas:  No mass or inflammatory changes. Spleen: Within normal limits in size and appearance. Adrenals/Urinary Tract: No suspicious masses identified. No evidence of ureteral calculi or hydronephrosis. Stomach/Bowel: Abnormal appendix is seen with periappendiceal inflammatory changes and rim enhancing fluid collection measuring 3.7 x 3.1 cm. This shows no significant change since previous study and is consistent with ruptured appendicitis with periappendiceal abscess. Small amount of free fluid in the pelvic cul-de-sac is decreased since previous study. No other abscess identified. No evidence of bowel obstruction. Vascular/Lymphatic: No pathologically  enlarged lymph nodes. No acute vascular findings. Aortic atherosclerotic calcification incidentally noted. Reproductive: Brachytherapy seeds again seen throughout the prostate bed. Other:  None. Musculoskeletal:  No suspicious bone lesions identified. IMPRESSION: No significant change in ruptured appendicitis with periappendiceal abscess. Decreased small amount of free  fluid in pelvic cul-de-sac. Prostate brachytherapy seeds.  No evidence of metastatic disease. Aortic Atherosclerosis (ICD10-I70.0). Electronically Signed   By: Marlaine Hind M.D.   On: 06/22/2022 14:08    Anti-infectives: Anti-infectives (From admission, onward)    Start     Dose/Rate Route Frequency Ordered Stop   06/18/22 2100  piperacillin-tazobactam (ZOSYN) IVPB 3.375 g        3.375 g 12.5 mL/hr over 240 Minutes Intravenous Every 8 hours 06/18/22 2056 06/25/22 2359   06/18/22 1600  piperacillin-tazobactam (ZOSYN) IVPB 3.375 g        3.375 g 100 mL/hr over 30 Minutes Intravenous  Once 06/18/22 1558 06/18/22 1634        Assessment/Plan Perforated appendicitis with abscess vs underlying mass - repeat CT 8/27 showed ruptured appendicitis with 3.7 x 3.1 cm periappendiceal abscess - IR c/s, planning to drain today - continue IV antibiotics - ok to resume regular diet when back from procedure  ID - zosyn 8/23>> FEN - IVF, NPO for procedure VTE - lovenox held for procedure Foley - none  AKI - Cr slightly up 1.42, restart IVF esp while NPO HTN - home meds HLD  I reviewed Consultant IR notes, last 24 h vitals and pain scores, last 48 h intake and output, last 24 h labs and trends, and last 24 h imaging results.    LOS: 5 days    [facility] 06/23/2022, 10:34 AM Please see Amion for pager number during day hours 7:00am-4:30pm

## 2022-06-23 NOTE — Plan of Care (Signed)
  Problem: Clinical Measurements: Goal: Ability to maintain clinical measurements within normal limits will improve Outcome: Progressing   

## 2022-06-23 NOTE — H&P (Signed)
Chief Complaint: Patient was seen in consultation today for intra-abdominal fluid collection  at the request of Rolm Bookbinder, MD  Referring Physician(s): Rolm Bookbinder, MD  Supervising Physician: Aletta Edouard  Patient Status: [facility] - In-pt  History of Present Illness: Jeremy Bush is a 66 y.o. male that presented to the ED 06/18/2022 complaining of right lower quadrant pain in the pelvis for approximately 10 to 12 days.  Patient had outpatient CT scan that demonstrated perforated appendicitis with contained abscess.  Surgery requested abscess drain 06/19/2022 that was refused by Dr.Mugweru stating abscess was not completely formed with questionable underlying mass.  A second request was placed 06/22/2022 for intra-abdominal abscess drain for periappendiceal enhancing fluid collection.  Procedure was approved by Dr. Annamaria Boots 06/22/2022.  Case was reviewed once again 06/23/2022 by Dr. Kathlene Cote and approved for intra-abdominal fluid collection with aspiration with possible drain placement.  Past Medical History:  Diagnosis Date   Anxiety    related to work stress   Colonic polyp 2007, 2013   Elevated uric acid in blood    with possible gout flare x 2   Hyperlipidemia    Hypertension    Nocturia    Prostate cancer ([place]) 03/24/12   gleason 6, vol 31.3 cc    Past Surgical History:  Procedure Laterality Date   BIOPSY PROSTATE  03/24/12   gleason 3+3=6/prostate volume 31 cc's  (md office)   CYSTOSCOPY  08/13/2012   Procedure: CYSTOSCOPY FLEXIBLE;  Surgeon: Claybon Jabs, MD;  [facility];  Service: Urology;  Laterality: N/A;  no seeds found in bladder   RADIOACTIVE SEED IMPLANT  08/13/2012   Procedure: RADIOACTIVE SEED IMPLANT;  Surgeon: Claybon Jabs, MD;  Location: [facility];  Service: Urology;  Laterality: N/A;   60    seeds implanted    Allergies: Sulfa antibiotics  Medications: Prior to Admission medications    Medication Sig Start Date End Date Taking? Authorizing Provider  allopurinol (ZYLOPRIM) 100 MG tablet TAKE ONE TABLET BY MOUTH DAILY 05/12/22  Yes Rita Ohara, MD  ALPRAZolam (XANAX) 0.5 MG tablet TAKE ONE-HALF TO ONE (0.5-1) TABLET BY MOUTH THREE TIMES A DAY AS NEEDED FOR ANXIETY Patient taking differently: Take 0.25-0.5 mg by mouth 3 (three) times daily as needed for anxiety. 04/01/22  Yes Denita Lung, MD  atenolol (TENORMIN) 25 MG tablet TAKE ONE TABLET BY MOUTH DAILY 05/12/22  Yes Rita Ohara, MD  atorvastatin (LIPITOR) 40 MG tablet TAKE ONE TABLET BY MOUTH DAILY 05/12/22  Yes Rita Ohara, MD  lisinopril (ZESTRIL) 20 MG tablet TAKE ONE TABLET BY MOUTH DAILY 05/12/22  Yes Rita Ohara, MD  omeprazole (PRILOSEC) 20 MG capsule Take 20 mg by mouth daily as needed (indigestion).   Yes [provider]     Family History  Problem Relation Age of Onset   Cancer Mother        lung (smoker)   Hypertension Mother    Hyperlipidemia Mother    Stroke Father        mini-stroke   Hypertension Father    Kidney disease Father        on dialysis   Diabetes Father    Healthy Sister     Social History   Socioeconomic History   Marital status: Married    Spouse name: Not on file   Number of children: Not on file   Years of education: Not on file   Highest education level: Not on file  Occupational  History   Not on file  Tobacco Use   Smoking status: Never   Smokeless tobacco: Never  Vaping Use   Vaping Use: Never used  Substance and Sexual Activity   Alcohol use: Yes    Alcohol/week: 14.0 standard drinks of alcohol    Types: 14 Standard drinks or equivalent per week    Comment: 2 drinks/day (or more in weekends if less during week)   Drug use: No   Sexual activity: Yes    Partners: Female  Other Topics Concern   Not on file  Social History Narrative   Lives with wife, 1 dog.     2 children and 1 stepchild.  2 daughters in [place] ([place]), stepson in [place]. 2  granddaughters.   1 daughter got married, other is getting married this year.   Retired.   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      Updated 12/2020   Social Determinants of Health   Financial Resource Strain: Not on file  Food Insecurity: Not on file  Transportation Needs: Not on file  Physical Activity: Not on file  Stress: Not on file  Social Connections: Not on file    Review of Systems: A 12 point ROS discussed and pertinent positives are indicated in the HPI above.  All other systems are negative.  Review of Systems  Constitutional:  Negative for appetite change, chills, fatigue and fever.  Respiratory:  Negative for cough and shortness of breath.   Cardiovascular:  Negative for chest pain and leg swelling.  Gastrointestinal:  Positive for abdominal pain. Negative for nausea and vomiting.       Very specific small area of RLQ tenderness with palpation   Neurological:  Negative for dizziness, weakness and headaches.    Vital Signs: BP 131/80 (BP Location: Left Arm)   Pulse (!) 52   Temp 98 F (36.7 C) (Oral)   Resp 18   Ht '5\' 10"'$  (1.778 m)   Wt 168 lb 3.4 oz (76.3 kg)   SpO2 100%   BMI 24.14 kg/m     Physical Exam Vitals reviewed.  Constitutional:      General: He is not in acute distress.    Appearance: Normal appearance. He is not ill-appearing.  HENT:     Head: Normocephalic and atraumatic.     Mouth/Throat:     Mouth: Mucous membranes are moist.     Pharynx: Oropharynx is clear.  Eyes:     Extraocular Movements: Extraocular movements intact.     Pupils: Pupils are equal, round, and reactive to light.  Cardiovascular:     Rate and Rhythm: Regular rhythm. Bradycardia present.     Pulses: Normal pulses.     Heart sounds: Normal heart sounds.  Pulmonary:     Effort: Pulmonary effort is normal.     Breath sounds: Normal breath sounds.  Abdominal:     General: Bowel sounds are normal. There is no distension.      Palpations: Abdomen is soft.     Tenderness: There is abdominal tenderness. There is no guarding.  Musculoskeletal:     Right lower leg: No edema.     Left lower leg: No edema.  Skin:    General: Skin is warm and dry.  Neurological:     Mental Status: He is alert and oriented to person, place, and time.  Psychiatric:        Mood and Affect: Mood normal.  Behavior: Behavior normal.        Thought Content: Thought content normal.        Judgment: Judgment normal.     Imaging: CT ABDOMEN PELVIS W CONTRAST  Result Date: 06/22/2022 CLINICAL DATA:  Worsening right lower quadrant pain. Follow-up appendicitis and abscess. Prostate carcinoma. * Tracking Code: BO * EXAM: CT ABDOMEN AND PELVIS WITH CONTRAST TECHNIQUE: Multidetector CT imaging of the abdomen and pelvis was performed using the standard protocol following bolus administration of intravenous contrast. RADIATION DOSE REDUCTION: This exam was performed according to the departmental dose-optimization program which includes automated exposure control, adjustment of the mA and/or kV according to patient size and/or use of iterative reconstruction technique. CONTRAST:  139m OMNIPAQUE IOHEXOL 300 MG/ML  SOLN COMPARISON:  06/18/2022 FINDINGS: Lower Chest: No acute findings. Hepatobiliary: No hepatic masses identified. Gallbladder is unremarkable. No evidence of biliary ductal dilatation. Pancreas:  No mass or inflammatory changes. Spleen: Within normal limits in size and appearance. Adrenals/Urinary Tract: No suspicious masses identified. No evidence of ureteral calculi or hydronephrosis. Stomach/Bowel: Abnormal appendix is seen with periappendiceal inflammatory changes and rim enhancing fluid collection measuring 3.7 x 3.1 cm. This shows no significant change since previous study and is consistent with ruptured appendicitis with periappendiceal abscess. Small amount of free fluid in the pelvic cul-de-sac is decreased since previous study. No  other abscess identified. No evidence of bowel obstruction. Vascular/Lymphatic: No pathologically enlarged lymph nodes. No acute vascular findings. Aortic atherosclerotic calcification incidentally noted. Reproductive: Brachytherapy seeds again seen throughout the prostate bed. Other:  None. Musculoskeletal:  No suspicious bone lesions identified. IMPRESSION: No significant change in ruptured appendicitis with periappendiceal abscess. Decreased small amount of free fluid in pelvic cul-de-sac. Prostate brachytherapy seeds.  No evidence of metastatic disease. Aortic Atherosclerosis (ICD10-I70.0). Electronically Signed   By: JMarlaine HindM.D.   On: 06/22/2022 14:08   CT Abdomen Pelvis W Contrast  Result Date: 06/18/2022 CLINICAL DATA:  RLQ abdominal pain (Age >= 14y) EXAM: CT ABDOMEN AND PELVIS WITH CONTRAST TECHNIQUE: Multidetector CT imaging of the abdomen and pelvis was performed using the standard protocol following bolus administration of intravenous contrast. RADIATION DOSE REDUCTION: This exam was performed according to the departmental dose-optimization program which includes automated exposure control, adjustment of the mA and/or kV according to patient size and/or use of iterative reconstruction technique. CONTRAST:  1026mOMNIPAQUE IOHEXOL 350 MG/ML SOLN COMPARISON:  None Available. FINDINGS: Lower chest: No acute abnormality. Hepatobiliary: No focal liver abnormality is seen. No gallstones, gallbladder wall thickening, or biliary dilatation. Pancreas: Unremarkable. No pancreatic ductal dilatation or surrounding inflammatory changes. Spleen: Unremarkable. Adrenals/Urinary Tract: Adrenals are unremarkable. Small cyst at the upper pole the right kidney. Bladder is unremarkable. Stomach/Bowel: Stomach is within normal limits. Bowel is normal in caliber. There is a 3.3 x 3.6 x 3.2 cm area of fat infiltration with a well-defined wall contiguous with and irregular proximal appendix. Infiltration of adjacent  fat. Vascular/Lymphatic: Atherosclerosis.  No enlarged nodes. Reproductive: Prostate radiation seeds. Other: Small volume free fluid in the dependent pelvis. Abdominal wall is unremarkable. Musculoskeletal: Chronic L5 pars breaks with grade 1 anterolisthesis at L5-S1. Foraminal stenosis at this level. IMPRESSION: Ruptured appendicitis with probable developing abscess. These results will be called to the ordering clinician or representative by the Radiologist Assistant, and communication documented in the PACS or ClFrontier Oil CorporationElectronically Signed   By: PrMacy Mis.D.   On: 06/18/2022 11:17    Labs:  CBC: Recent Labs    06/20/22 1112 06/21/22  3976 06/22/22 0446 06/23/22 0409  WBC 13.6* 14.4* 16.1* 12.2*  HGB 13.3 12.3* 12.4* 12.4*  HCT 40.4 36.6* 37.4* 37.2*  PLT 507* 457* 472* 474*    COAGS: Recent Labs    06/19/22 0838  INR 1.1    BMP: Recent Labs    06/19/22 0320 06/20/22 1112 06/21/22 0524 06/23/22 0409  NA 136 140 139 142  K 4.2 5.2* 4.3 4.4  CL 104 107 106 107  CO2 '24 25 26 27  '$ GLUCOSE 96 103* 102* 101*  BUN 67* 37* 24* 17  CALCIUM 8.9 9.4 8.8* 9.2  CREATININE 1.77* 1.41* 1.35* 1.42*  GFRNONAA 41* 54* 57* 54*    LIVER FUNCTION TESTS: Recent Labs    10/10/21 0816 06/12/22 1401 06/18/22 1551 06/19/22 0320  BILITOT 0.5 1.8* 0.6 0.7  AST 32 '22 30 19  '$ ALT 31 22 35 33  ALKPHOS 80 82 71 60  PROT 7.6 6.9 7.5 6.7  ALBUMIN 4.9* 4.6 3.6 3.3*    TUMOR MARKERS: No results for input(s): "AFPTM", "CEA", "CA199", "CHROMGRNA" in the last 8760 hours.  Assessment and Plan: 66 year old male admitted for right lower quadrant pain found to have appendicitis with periappendiceal rim-enhancing fluid collection measuring 3.7 x 3.1 cm. Procedure approved by Dr. Annamaria Boots and Dr. Kathlene Cote, [place].  Pt sitting upright in recliner on laptop. He is A&O, calm and pleasant. He is in no distress.  Pt states he is NPO per order.  Last Lovenox 06/22/22 @ 2237.   Risks and  benefits of intra-abdominal fluid collection aspiration with possible drain placement with moderate sedation discussed with the patient including bleeding, infection, damage to adjacent structures, bowel perforation/fistula connection, and sepsis.  All of the patient's questions were answered, patient is agreeable to proceed. Consent signed and in chart.   Thank you for this interesting consult.  I greatly enjoyed meeting Haroon Shatto and look forward to participating in their care.  A copy of this report was sent to the requesting provider on this date.  Electronically Signed: Tyson Alias, NP 06/23/2022, 10:14 AM   I spent a total of 20 minutes in face to face in clinical consultation, greater than 50% of which was counseling/coordinating care for intra-abdominal fluid collection.

## 2022-06-23 NOTE — Procedures (Signed)
Interventional Radiology Procedure Note  Procedure: CT guided aspiration of RLQ fluid collection  Complications: None  Estimated Blood Loss: None  Findings: RLQ fluid collection even smaller by CT compared to yesterday's study. Aspiration with 18G trocar needle yielded only 1 mL of purulent fluid. Sent for culture.  Venetia Night. Kathlene Cote, M.D Pager:  [phone]

## 2022-06-24 ENCOUNTER — Encounter: Payer: Self-pay | Admitting: Family Medicine

## 2022-06-24 LAB — CBC
HCT: 37.2 % — ABNORMAL LOW (ref 39.0–52.0)
Hemoglobin: 12.2 g/dL — ABNORMAL LOW (ref 13.0–17.0)
MCH: 31.9 pg (ref 26.0–34.0)
MCHC: 32.8 g/dL (ref 30.0–36.0)
MCV: 97.1 fL (ref 80.0–100.0)
Platelets: 489 10*3/uL — ABNORMAL HIGH (ref 150–400)
RBC: 3.83 MIL/uL — ABNORMAL LOW (ref 4.22–5.81)
RDW: 12.1 % (ref 11.5–15.5)
WBC: 11.4 10*3/uL — ABNORMAL HIGH (ref 4.0–10.5)
nRBC: 0 % (ref 0.0–0.2)

## 2022-06-24 LAB — BASIC METABOLIC PANEL
Anion gap: 7 (ref 5–15)
BUN: 25 mg/dL — ABNORMAL HIGH (ref 8–23)
CO2: 25 mmol/L (ref 22–32)
Calcium: 9 mg/dL (ref 8.9–10.3)
Chloride: 109 mmol/L (ref 98–111)
Creatinine, Ser: 1.39 mg/dL — ABNORMAL HIGH (ref 0.61–1.24)
GFR, Estimated: 55 mL/min — ABNORMAL LOW (ref >60)
Glucose, Bld: 97 mg/dL (ref 70–99)
Potassium: 4.1 mmol/L (ref 3.5–5.1)
Sodium: 141 mmol/L (ref 135–145)

## 2022-06-24 MED ORDER — AMOXICILLIN-POT CLAVULANATE 875-125 MG PO TABS: 1.0000 | ORAL_TABLET | Freq: Two times a day (BID) | ORAL | 0 refills | Status: AC

## 2022-06-24 NOTE — Progress Notes (Signed)
[facility] Progress Note     Subjective: CC-  Only pain is with palpation over RLQ where aspiration was performed. No n/v and tolerating diet.  Objective: Vital signs in last 24 hours: Temp:  [97.4 F (36.3 C)-98.5 F (36.9 C)] 98 F (36.7 C) (08/29 2831) Pulse Rate:  [45-54] 51 (08/29 0638) Resp:  [13-18] 16 (08/29 5176) BP: (108-141)/(60-89) 125/79 (08/29 0638) SpO2:  [98 %-100 %] 98 % (08/29 0638) Last BM Date : 06/24/22  Intake/Output from previous day: 08/28 0701 - 08/29 0700 In: 1607 [P.O.:700; I.V.:590.1; IV Piggyback:96.9] Out: 0  Intake/Output this shift: No intake/output data recorded.  PE: Gen:  Alert, NAD, pleasant Pulm: rate and effort normal on room air Abd: soft, ND, mild RLQ TTP without rebound or guarding  Lab Results:  Recent Labs    06/23/22 0409 06/24/22 0529  WBC 12.2* 11.4*  HGB 12.4* 12.2*  HCT 37.2* 37.2*  PLT 474* 489*    BMET Recent Labs    06/23/22 0409 06/24/22 0529  NA 142 141  K 4.4 4.1  CL 107 109  CO2 27 25  GLUCOSE 101* 97  BUN 17 25*  CREATININE 1.42* 1.39*  CALCIUM 9.2 9.0    PT/INR No results for input(s): "LABPROT", "INR" in the last 72 hours. CMP     Component Value Date/Time   NA 141 06/24/2022 0529   NA 133 (L) 06/12/2022 1401   K 4.1 06/24/2022 0529   CL 109 06/24/2022 0529   CO2 25 06/24/2022 0529   GLUCOSE 97 06/24/2022 0529   BUN 25 (H) 06/24/2022 0529   BUN 12 06/12/2022 1401   CREATININE 1.39 (H) 06/24/2022 0529   CREATININE 1.06 03/26/2017 0740   CALCIUM 9.0 06/24/2022 0529   PROT 6.7 06/19/2022 0320   PROT 6.9 06/12/2022 1401   ALBUMIN 3.3 (L) 06/19/2022 0320   ALBUMIN 4.6 06/12/2022 1401   AST 19 06/19/2022 0320   ALT 33 06/19/2022 0320   ALKPHOS 60 06/19/2022 0320   BILITOT 0.7 06/19/2022 0320   BILITOT 1.8 (H) 06/12/2022 1401   GFRNONAA 55 (L) 06/24/2022 0529   GFRAA 80 04/19/2020 0822   Lipase  No results found for: "LIPASE"     Studies/Results: CT GUIDED  NEEDLE PLACEMENT  Result Date: 06/23/2022 CLINICAL DATA:  Acute appendicitis with small, persistent periappendiceal fluid collection/abscess. EXAM: CT GUIDED ASPIRATION OF RIGHT LOWER QUADRANT FLUID PERITONEAL COLLECTION ANESTHESIA/SEDATION: Moderate (conscious) sedation was employed during this procedure. A total of Versed 2.0 mg and Fentanyl 200 mcg was administered intravenously by radiology nursing. Moderate Sedation Time: 16 minutes. The patient's level of consciousness and vital signs were monitored continuously by radiology nursing throughout the procedure under my direct supervision. PROCEDURE: The procedure, risks, benefits, and alternatives were explained to the patient. Questions regarding the procedure were encouraged and answered. The patient understands and consents to the procedure. A time out was performed prior to initiating the procedure. CT was performed through the abdomen and pelvis in a supine position. The right lower abdominal wall was prepped with chlorhexidine in a sterile fashion, and a sterile drape was applied covering the operative field. A sterile gown and sterile gloves were used for the procedure. Local anesthesia was provided with 1% Lidocaine. An 18 gauge trocar needle was advanced under CT guidance to the level of a right lower quadrant fluid collection. After confirming needle tip position, aspiration was performed. The needle was repositioned and additional aspiration also performed. Aspirated fluid sample was sent for culture  analysis. The needle was removed. RADIATION DOSE REDUCTION: This exam was performed according to the departmental dose-optimization program which includes automated exposure control, adjustment of the mA and/or kV according to patient size and/or use of iterative reconstruction technique. COMPLICATIONS: None FINDINGS: There remains only a small periappendiceal fluid collection immediately inferior to the tip of the cecum. Aspiration yielded only 1 mL of  purulent fluid. The collection is not large enough to place a drainage catheter into. IMPRESSION: Aspiration of small periappendiceal fluid collection yielded 1 mL of purulent fluid. This sample was sent for culture analysis. Electronically Signed   By: Aletta Edouard M.D.   On: 06/23/2022 17:18   CT ABDOMEN PELVIS W CONTRAST  Result Date: 06/22/2022 CLINICAL DATA:  Worsening right lower quadrant pain. Follow-up appendicitis and abscess. Prostate carcinoma. * Tracking Code: BO * EXAM: CT ABDOMEN AND PELVIS WITH CONTRAST TECHNIQUE: Multidetector CT imaging of the abdomen and pelvis was performed using the standard protocol following bolus administration of intravenous contrast. RADIATION DOSE REDUCTION: This exam was performed according to the departmental dose-optimization program which includes automated exposure control, adjustment of the mA and/or kV according to patient size and/or use of iterative reconstruction technique. CONTRAST:  112m OMNIPAQUE IOHEXOL 300 MG/ML  SOLN COMPARISON:  06/18/2022 FINDINGS: Lower Chest: No acute findings. Hepatobiliary: No hepatic masses identified. Gallbladder is unremarkable. No evidence of biliary ductal dilatation. Pancreas:  No mass or inflammatory changes. Spleen: Within normal limits in size and appearance. Adrenals/Urinary Tract: No suspicious masses identified. No evidence of ureteral calculi or hydronephrosis. Stomach/Bowel: Abnormal appendix is seen with periappendiceal inflammatory changes and rim enhancing fluid collection measuring 3.7 x 3.1 cm. This shows no significant change since previous study and is consistent with ruptured appendicitis with periappendiceal abscess. Small amount of free fluid in the pelvic cul-de-sac is decreased since previous study. No other abscess identified. No evidence of bowel obstruction. Vascular/Lymphatic: No pathologically enlarged lymph nodes. No acute vascular findings. Aortic atherosclerotic calcification incidentally  noted. Reproductive: Brachytherapy seeds again seen throughout the prostate bed. Other:  None. Musculoskeletal:  No suspicious bone lesions identified. IMPRESSION: No significant change in ruptured appendicitis with periappendiceal abscess. Decreased small amount of free fluid in pelvic cul-de-sac. Prostate brachytherapy seeds.  No evidence of metastatic disease. Aortic Atherosclerosis (ICD10-I70.0). Electronically Signed   By: JMarlaine HindM.D.   On: 06/22/2022 14:08    Anti-infectives: Anti-infectives (From admission, onward)    Start     Dose/Rate Route Frequency Ordered Stop   06/18/22 2100  piperacillin-tazobactam (ZOSYN) IVPB 3.375 g        3.375 g 12.5 mL/hr over 240 Minutes Intravenous Every 8 hours 06/18/22 2056 06/25/22 2359   06/18/22 1600  piperacillin-tazobactam (ZOSYN) IVPB 3.375 g        3.375 g 100 mL/hr over 30 Minutes Intravenous  Once 06/18/22 1558 06/18/22 1634        Assessment/Plan Perforated appendicitis with abscess vs underlying mass - repeat CT 8/27 showed ruptured appendicitis with 3.7 x 3.1 cm periappendiceal abscess - IR aspirated the collection yesterday (1 ml purulent fluid) and culture pending - continue IV antibiotics - will transition to PO at dc to complete 10 day course - will need outpt follow up with GI for c scope prior to interval appy  ID - zosyn 8/23>> FEN - IVF, regular VTE - lovenox Foley - none  AKI - Cr still slightly up 1.39, continue IVF. Follow up outpatient with PCP HTN - home meds HLD  I reviewed Consultant  IR notes, last 24 h vitals and pain scores, last 48 h intake and output, last 24 h labs and trends, and last 24 h imaging results.  Stable for discharge toda   LOS: 6 days    Winferd Humphrey, [facility] Surgery 06/24/2022, 9:45 AM Please see Amion for pager number during day hours 7:00am-4:30pm

## 2022-06-24 NOTE — Plan of Care (Signed)
  Problem: Education: Goal: Knowledge of General Education information will improve Description: Including pain rating scale, medication(s)/side effects and non-pharmacologic comfort measures 06/24/2022 1255 by Kerney Elbe, RN Outcome: Adequate for Discharge 06/24/2022 [id number] by Kerney Elbe, RN Outcome: Progressing

## 2022-06-24 NOTE — Discharge Summary (Signed)
Jeremy Bush   Patient ID: Jeremy Bush MRN: [id number] DOB/AGE: [birth year] 66 y.o.  Admit date: 06/18/2022 Discharge date: 06/24/2022  Admitting Diagnosis: Perforated appendicitis   Discharge Diagnosis Patient Active Problem List   Diagnosis Date Noted   Appendicitis with abscess 06/18/2022   Acute perforated appendicitis 06/18/2022   Gout 11/01/2015   Anxiety state 04/18/2015   GERD (gastroesophageal reflux disease) 11/09/2013   IFG (impaired fasting glucose) 07/28/2013   Colonic polyp    Nocturia    Hypertension    Hyperlipidemia    Adenocarcinoma of prostate ([place]) 04/01/2012   Prostate cancer ([place]) 03/24/2012   Elevated PSA 01/19/2012   Essential hypertension, benign 05/28/2011   Pure hypercholesterolemia 03/05/2011   Adenomatous colon polyp 03/05/2011    Consultants Interventional radiology  Imaging: CT GUIDED NEEDLE PLACEMENT  Result Date: 06/23/2022 CLINICAL DATA:  Acute appendicitis with small, persistent periappendiceal fluid collection/abscess. EXAM: CT GUIDED ASPIRATION OF RIGHT LOWER QUADRANT FLUID PERITONEAL COLLECTION ANESTHESIA/SEDATION: Moderate (conscious) sedation was employed during this procedure. A total of Versed 2.0 mg and Fentanyl 200 mcg was administered intravenously by radiology nursing. Moderate Sedation Time: 16 minutes. The patient's level of consciousness and vital signs were monitored continuously by radiology nursing throughout the procedure under my direct supervision. PROCEDURE: The procedure, risks, benefits, and alternatives were explained to the patient. Questions regarding the procedure were encouraged and answered. The patient understands and consents to the procedure. A time out was performed prior to initiating the procedure. CT was performed through the abdomen and pelvis in a supine position. The right lower abdominal wall was prepped with chlorhexidine in a sterile fashion, and a sterile drape was  applied covering the operative field. A sterile gown and sterile gloves were used for the procedure. Local anesthesia was provided with 1% Lidocaine. An 18 gauge trocar needle was advanced under CT guidance to the level of a right lower quadrant fluid collection. After confirming needle tip position, aspiration was performed. The needle was repositioned and additional aspiration also performed. Aspirated fluid sample was sent for culture analysis. The needle was removed. RADIATION DOSE REDUCTION: This exam was performed according to the departmental dose-optimization program which includes automated exposure control, adjustment of the mA and/or kV according to patient size and/or use of iterative reconstruction technique. COMPLICATIONS: None FINDINGS: There remains only a small periappendiceal fluid collection immediately inferior to the tip of the cecum. Aspiration yielded only 1 mL of purulent fluid. The collection is not large enough to place a drainage catheter into. IMPRESSION: Aspiration of small periappendiceal fluid collection yielded 1 mL of purulent fluid. This sample was sent for culture analysis. Electronically Signed   By: Aletta Edouard M.D.   On: 06/23/2022 17:18   CT ABDOMEN PELVIS W CONTRAST  Result Date: 06/22/2022 CLINICAL DATA:  Worsening right lower quadrant pain. Follow-up appendicitis and abscess. Prostate carcinoma. * Tracking Code: BO * EXAM: CT ABDOMEN AND PELVIS WITH CONTRAST TECHNIQUE: Multidetector CT imaging of the abdomen and pelvis was performed using the standard protocol following bolus administration of intravenous contrast. RADIATION DOSE REDUCTION: This exam was performed according to the departmental dose-optimization program which includes automated exposure control, adjustment of the mA and/or kV according to patient size and/or use of iterative reconstruction technique. CONTRAST:  143m OMNIPAQUE IOHEXOL 300 MG/ML  SOLN COMPARISON:  06/18/2022 FINDINGS: Lower Chest: No  acute findings. Hepatobiliary: No hepatic masses identified. Gallbladder is unremarkable. No evidence of biliary ductal dilatation. Pancreas:  No mass or inflammatory changes. Spleen: Within  normal limits in size and appearance. Adrenals/Urinary Tract: No suspicious masses identified. No evidence of ureteral calculi or hydronephrosis. Stomach/Bowel: Abnormal appendix is seen with periappendiceal inflammatory changes and rim enhancing fluid collection measuring 3.7 x 3.1 cm. This shows no significant change since previous study and is consistent with ruptured appendicitis with periappendiceal abscess. Small amount of free fluid in the pelvic cul-de-sac is decreased since previous study. No other abscess identified. No evidence of bowel obstruction. Vascular/Lymphatic: No pathologically enlarged lymph nodes. No acute vascular findings. Aortic atherosclerotic calcification incidentally noted. Reproductive: Brachytherapy seeds again seen throughout the prostate bed. Other:  None. Musculoskeletal:  No suspicious bone lesions identified. IMPRESSION: No significant change in ruptured appendicitis with periappendiceal abscess. Decreased small amount of free fluid in pelvic cul-de-sac. Prostate brachytherapy seeds.  No evidence of metastatic disease. Aortic Atherosclerosis (ICD10-I70.0). Electronically Signed   By: Marlaine Hind M.D.   On: 06/22/2022 14:08    Procedures Dr. Kathlene Cote (06/23/2022) - CT guided aspiration of RLQ fluid collection  Hospital Course:  Jeremy Bush is a 66 y.o. male who [facility]/23 with severe abdominal pain, fever, chills, diarrhea that began 10 to 14 days ago.  Work-up showed elevated white blood cell count and so a CT scan was ordered which showed a ruptured appendicitis. Patient was admitted to the surgical service and started on broad spectrum IV antibiotics. IR was consulted and advised that the abscess was not completely formed and with questionable underlying mass,  therefore he was continued on conservative management. He clinically improved but leukocytosis worsened so a CT was repeated 06/22/22 which showed an abnormal appendix with periappendiceal inflammatory changes and rim enhancing fluid collection measuring 3.7 x 3.1 cm. IR was again consulted and aspirated 1cc purulent fluid from the abscess. Culture of this fluid pending at time of discharge. Patient continued to improve after this on IV zosyn. On 06/24/22 the patient was felt medically stable for discharge. He completed 7 days of zosyn during admission, and was discharged with 3 days of augmentin to complete a 10 day course. Patient will follow up with Dr. Johney Maine to discuss need for possible interval appendectomy. He will also need a repeat colonoscopy. Patient will follow up as below and knows to call with questions or concerns.       Allergies as of 06/24/2022       Reactions   Sulfa Antibiotics Rash        Medication List     TAKE these medications    allopurinol 100 MG tablet Commonly known as: ZYLOPRIM TAKE ONE TABLET BY MOUTH DAILY   ALPRAZolam 0.5 MG tablet Commonly known as: XANAX TAKE ONE-HALF TO ONE (0.5-1) TABLET BY MOUTH THREE TIMES A DAY AS NEEDED FOR ANXIETY What changed: See the new instructions.   amoxicillin-clavulanate 875-125 MG tablet Commonly known as: AUGMENTIN Take 1 tablet by mouth 2 (two) times daily for 3 days.   atenolol 25 MG tablet Commonly known as: TENORMIN TAKE ONE TABLET BY MOUTH DAILY   atorvastatin 40 MG tablet Commonly known as: LIPITOR TAKE ONE TABLET BY MOUTH DAILY   lisinopril 20 MG tablet Commonly known as: ZESTRIL TAKE ONE TABLET BY MOUTH DAILY   omeprazole 20 MG capsule Commonly known as: PRILOSEC Take 20 mg by mouth daily as needed (indigestion).          Follow-up Information     Michael Boston, MD. Go on 07/14/2022.   Specialties: General Surgery, Colon and Rectal Surgery Why: Your appointment is 07/14/22  at  11:30am Arrive 42mn early to check in, fill out paperwork, BEngineer, civil (consulting)ID and iDoctor, general practiceinformation: [address] [phone]         KRita Ohara MD. Call.   Specialty: Family Medicine Why: Call for post-hospitalization follow up appointment. Discussed your elevated creatinine in blood work (kidney function) Contact information: [place] [id number]        Gastroenterology, ESadie Haber Call.   Why: Call to discuss repeat colonoscopy Contact information: [place] [phone]                 Signed: BWellington [facility] 06/24/2022, 11:38 AM Please see Amion for pager number during day hours 7:00am-4:30pm

## 2022-06-24 NOTE — Progress Notes (Signed)
24 hour chart audit completed 

## 2022-06-25 ENCOUNTER — Telehealth: Payer: Self-pay

## 2022-06-25 NOTE — Telephone Encounter (Signed)
Transition Care Management Follow-up Telephone Call Date of discharge and from where: [facility] How have you been since you were released from the hospital? [place] Any questions or concerns? No  Items Reviewed: Did the pt receive and understand the discharge instructions provided? Yes  Medications obtained and verified? Yes  Other? No  Any new allergies since your discharge? No  Dietary orders reviewed? Yes Do you have support at home? Yes   Home Care and Equipment/Supplies: Were home health services ordered? no  Functional Questionnaire: (I = Independent and D = Dependent) ADLs: I  Bathing/Dressing- I  Meal Prep- I  Eating- I  Maintaining continence- I  Transferring/Ambulation- I  Managing Meds- I  Follow up appointments reviewed:  PCP Hospital f/u appt confirmed? Yes  Scheduled to see Dr. Tomi Bamberger on 06/26/22 @ 3:30. [facility] f/u appt confirmed? Yes  Scheduled to see General surgeon on 07/14/22 @ 11. Are transportation arrangements needed? No  If their condition worsens, is the pt aware to call PCP or go to the Emergency Dept.? Yes Was the patient provided with contact information for the PCP's office or ED? Yes Was to pt encouraged to call back with questions or concerns? Yes

## 2022-06-25 NOTE — Progress Notes (Unsigned)
No chief complaint on file.  Patient presents for hospital follow-up. He was hospitalized 8/23-29 with perforated appendix. He was started on broad spectrum IV antibiotics. WBC increased, so a CT was repeated 06/22/22 which showed an abnormal appendix with periappendiceal inflammatory changes and rim enhancing fluid collection. IR was consulted and aspirated 1cc purulent fluid from the abscess (done 8/28 by Dr. Kathlene Cote). Culture is still pending. He completed 7 days of zosyn during admission, and was discharged with 3 days of augmentin to complete a 10 day course.  Patient will follow up with Dr. Johney Maine to discuss need for possible appendectomy.   Discharge summary stated that he will also need a repeat colonoscopy. Chart reviewed--last colonoscopy was with Dr. Michail Sermon in 2019, hyperplastic polyps (10 year f/u recommended). Last CT: IMPRESSION: No significant change in ruptured appendicitis with periappendiceal abscess. Decreased small amount of free fluid in pelvic cul-de-sac. Prostate brachytherapy seeds.  No evidence of metastatic disease. Aortic Atherosclerosis (ICD10-I70.0).  Elevated creatinine was noted during admission.  It was normal on first visit with me.  BUN and Cr bumped up after the perforation and the CT scan (with contrast). This trended down.  Component Ref Range & Units 1 d ago 2 d ago 4 d ago 5 d ago 6 d ago 7 d ago 13 d ago  Sodium 135 - 145 mmol/L 141  142  139  140  136  133 Low   133 Low  R   Potassium 3.5 - 5.1 mmol/L 4.1  4.4  4.3  5.2 High  CM  4.2  3.9  4.2 R   Chloride 98 - 111 mmol/L 109  107  106  107  104  99  92 Low  R   CO2 22 - 32 mmol/L _0 Low   24 R   Glucose, Bld 70 - 99 mg/dL 97  101 High  CM  102 High  CM  103 High  CM  96 CM  123 High  CM  109 High    Comment: Glucose reference range applies only to samples taken after fasting for at least 8 hours.  BUN 8 - 23 mg/dL 25 High   17  24 High   37 High   67 High   81 High   12 R    Creatinine, Ser 0.61 - 1.24 mg/dL 1.39 High   1.42 High   1.35 High   1.41 High   1.77 High   2.24 High   1.10 R   Calcium 8.9 - 10.3 mg/dL 9.0  9.2  8.8 Low   9.4  8.9  9.4  9.5 R   GFR, Estimated >60 mL/min 55 Low   54 Low  CM  57 Low  CM  54 Low  CM  41 Low  CM  31 Low  CM       ASSESSMENT/PLAN:  F/u with Dr. Johney Maine as scheduled ?unclear why it states repeat colonoscopy is needed. He was advised to contact [facility]  Culture of abscess collection still pending  Repeat CBC, b-met

## 2022-06-26 ENCOUNTER — Ambulatory Visit (INDEPENDENT_AMBULATORY_CARE_PROVIDER_SITE_OTHER): Payer: Medicare Other | Admitting: Family Medicine

## 2022-06-26 ENCOUNTER — Encounter: Payer: Medicare Other | Admitting: Family Medicine

## 2022-06-26 ENCOUNTER — Encounter: Payer: Self-pay | Admitting: Family Medicine

## 2022-06-26 ENCOUNTER — Ambulatory Visit: Payer: Medicare Other | Admitting: Family Medicine

## 2022-06-26 VITALS — BP 120/80 | HR 68 | Temp 98.3°F | Ht 70.0 in | Wt 170.4 lb

## 2022-06-26 DIAGNOSIS — R7989 Other specified abnormal findings of blood chemistry: Secondary | ICD-10-CM

## 2022-06-26 DIAGNOSIS — K3532 Acute appendicitis with perforation and localized peritonitis, without abscess: Secondary | ICD-10-CM

## 2022-06-26 DIAGNOSIS — I700 Atherosclerosis of aorta: Secondary | ICD-10-CM | POA: Diagnosis not present

## 2022-06-27 LAB — BASIC METABOLIC PANEL
BUN/Creatinine Ratio: 16 (ref 10–24)
BUN: 17 mg/dL (ref 8–27)
CO2: 23 mmol/L (ref 20–29)
Calcium: 9.6 mg/dL (ref 8.6–10.2)
Chloride: 101 mmol/L (ref 96–106)
Creatinine, Ser: 1.04 mg/dL (ref 0.76–1.27)
Glucose: 86 mg/dL (ref 70–99)
Potassium: 4.6 mmol/L (ref 3.5–5.2)
Sodium: 140 mmol/L (ref 134–144)
eGFR: 78 mL/min/{1.73_m2} (ref >59)

## 2022-06-27 LAB — CBC WITH DIFFERENTIAL/PLATELET
Basophils Absolute: 0.1 10*3/uL (ref 0.0–0.2)
Basos: 1 %
EOS (ABSOLUTE): 0.1 10*3/uL (ref 0.0–0.4)
Eos: 1 %
Hematocrit: 38.6 % (ref 37.5–51.0)
Hemoglobin: 13.1 g/dL (ref 13.0–17.7)
Immature Grans (Abs): 0.1 10*3/uL (ref 0.0–0.1)
Immature Granulocytes: 1 %
Lymphocytes Absolute: 2.2 10*3/uL (ref 0.7–3.1)
Lymphs: 20 %
MCH: 31.9 pg (ref 26.6–33.0)
MCHC: 33.9 g/dL (ref 31.5–35.7)
MCV: 94 fL (ref 79–97)
Monocytes Absolute: 0.9 10*3/uL (ref 0.1–0.9)
Monocytes: 9 %
Neutrophils Absolute: 7.4 10*3/uL — ABNORMAL HIGH (ref 1.4–7.0)
Neutrophils: 68 %
Platelets: 530 10*3/uL — ABNORMAL HIGH (ref 150–450)
RBC: 4.11 x10E6/uL — ABNORMAL LOW (ref 4.14–5.80)
RDW: 11.8 % (ref 11.6–15.4)
WBC: 10.8 10*3/uL (ref 3.4–10.8)

## 2022-06-27 NOTE — Progress Notes (Signed)
Dr. Inda Coke labs on a mutual patient (not sure if you saw him in the hospital, but know that he will be seeing you for follow-up).  White count improved, kidney function back to normal. He is completing his oral antibiotics today--unsure if the plan was for longer-term antibiotics, but waiting on the final culture results? Or is he completely done with antibiotics? The patient wasn't clear on this.

## 2022-06-28 LAB — AEROBIC/ANAEROBIC CULTURE W GRAM STAIN (SURGICAL/DEEP WOUND)
Gram Stain: NONE SEEN
Special Requests: NORMAL

## 2022-07-01 ENCOUNTER — Encounter: Payer: Self-pay | Admitting: Family Medicine

## 2022-07-02 ENCOUNTER — Ambulatory Visit (INDEPENDENT_AMBULATORY_CARE_PROVIDER_SITE_OTHER): Payer: Medicare Other | Admitting: Family Medicine

## 2022-07-02 ENCOUNTER — Encounter: Payer: Self-pay | Admitting: Family Medicine

## 2022-07-02 ENCOUNTER — Other Ambulatory Visit: Payer: Self-pay | Admitting: *Deleted

## 2022-07-02 VITALS — BP 118/78 | HR 68 | Temp 97.7°F | Ht 70.0 in | Wt 169.4 lb

## 2022-07-02 DIAGNOSIS — F418 Other specified anxiety disorders: Secondary | ICD-10-CM | POA: Diagnosis not present

## 2022-07-02 DIAGNOSIS — K3532 Acute appendicitis with perforation and localized peritonitis, without abscess: Secondary | ICD-10-CM

## 2022-07-02 DIAGNOSIS — Z23 Encounter for immunization: Secondary | ICD-10-CM

## 2022-07-02 DIAGNOSIS — I10 Essential (primary) hypertension: Secondary | ICD-10-CM

## 2022-07-02 LAB — CBC WITH DIFFERENTIAL/PLATELET
Basophils Absolute: 0.1 10*3/uL (ref 0.0–0.2)
Basos: 1 %
EOS (ABSOLUTE): 0.1 10*3/uL (ref 0.0–0.4)
Eos: 1 %
Hematocrit: 39.1 % (ref 37.5–51.0)
Hemoglobin: 13.3 g/dL (ref 13.0–17.7)
Lymphocytes Absolute: 2.1 10*3/uL (ref 0.7–3.1)
Lymphs: 21 %
MCH: 31.4 pg (ref 26.6–33.0)
MCHC: 34 g/dL (ref 31.5–35.7)
MCV: 92 fL (ref 79–97)
Monocytes Absolute: 0.9 10*3/uL (ref 0.1–0.9)
Monocytes: 9 %
Neutrophils Absolute: 7 10*3/uL (ref 1.4–7.0)
Neutrophils: 68 %
Platelets: 355 10*3/uL (ref 150–450)
RBC: 4.23 x10E6/uL (ref 4.14–5.80)
RDW: 13.6 % (ref 11.6–15.4)
WBC: 10.1 10*3/uL (ref 3.4–10.8)

## 2022-07-02 MED ORDER — ATENOLOL 25 MG PO TABS: 12.5000 mg | ORAL_TABLET | Freq: Every day | ORAL | 0 refills | Status: DC

## 2022-07-02 NOTE — Progress Notes (Signed)
Chief Complaint  Patient presents with   Follow-up    Follow up, feeling better but has a soreness lower right side, since abx finished. Appetite is much better, eating well and exercising. Has been getting low bp readings at home and has been feeling dizzy. Was 95/60 last night.    "I feel great". Appetite is very good. Urine and bowels are pretty normal. He is getting up once a night to void. Stools are slightly hard, returning to normal.  He still has some soreness if he presses in the same spot at the RLQ. No spread of the discomfort, still very mild (1-2/10)--feels it when he stretches.  Exercised a little more in the last 2-3 days. He had some soreness on the inner portions of his thighs (?from elliptical)--lasted 1 day and resolved.  He hasn't been doing any core work (other than elliptical).  2d ago, he stood up quickly and felt a little dizzy. BP 93/60. Later that day, he felt fine, rechecked BP and it was 110/77. Tries to drink a lot of water. Today at noon BP 125/78.  Yesterday, around noon (takes med at [facility]), felt slightly dizzy, BP was a little low (96/65). Dizziness with standing was short-lived.  Drinking a lot of water, urine is pretty pale.  He has been on atenolol '25mg'$  and lisinopril '20mg'$  for quite some time.  He recalls the second med (couldn't recall which) was added when BP's were high during times of high stress. He no longer has high stress.  BP Readings from Last 3 Encounters:  07/02/22 118/78  06/26/22 120/80  06/24/22 125/79     PMH, PSH, SH reviewed  Outpatient Encounter Medications as of 07/02/2022  Medication Sig Note   allopurinol (ZYLOPRIM) 100 MG tablet TAKE ONE TABLET BY MOUTH DAILY    atenolol (TENORMIN) 25 MG tablet TAKE ONE TABLET BY MOUTH DAILY    atorvastatin (LIPITOR) 40 MG tablet TAKE ONE TABLET BY MOUTH DAILY    lisinopril (ZESTRIL) 20 MG tablet TAKE ONE TABLET BY MOUTH DAILY    omeprazole (PRILOSEC) 20 MG capsule Take 20 mg by mouth  daily as needed (indigestion).    ALPRAZolam (XANAX) 0.5 MG tablet TAKE ONE-HALF TO ONE (0.5-1) TABLET BY MOUTH THREE TIMES A DAY AS NEEDED FOR ANXIETY (Patient not taking: Reported on 06/26/2022) 06/26/2022: prn   No facility-administered encounter medications on file as of 07/02/2022.   Allergies  Allergen Reactions   Sulfa Antibiotics Rash    ROS:  No fever, chills, no nausea or vomiting. Some dizziness and lower BP's per HPI. Mild RLQ abdominal pain per HPI.  No urinary complaints, bowels normal per HPI.  Short-lived bilateral leg pain had resolved (relates to restarting exercise).  No bleeding, rashes or other concerns.  See HPI.   PHYSICAL EXAM:  BP 118/78   Pulse 68   Temp 97.7 F (36.5 C) (Tympanic)   Ht '5\' 10"'$  (1.778 m)   Wt 169 lb 6.4 oz (76.8 kg)   BMI 24.31 kg/m   Wt Readings from Last 3 Encounters:  07/02/22 169 lb 6.4 oz (76.8 kg)  06/26/22 170 lb 6.4 oz (77.3 kg)  06/18/22 168 lb 3.4 oz (76.3 kg)   Well-appearing male in good spirits, in no distress HEENT conjunctiva and sclera are clear, EOMI. Heart: regular rate and rhythm Abdomen: normal bowel sounds, soft.  Minimal tenderness with intermittent guarding at RLQ. No rebound, no mass Extremities: no edema Skin: normal turgor, no rash  Lab Results  Component  Value Date   WBC 10.1 07/02/2022   HGB 13.3 07/02/2022   HCT 39.1 07/02/2022   MCV 92 07/02/2022   PLT 355 07/02/2022     ASSESSMENT/PLAN:  Perforated appendicitis - He has some mild persistent RLQ pain.  Stat CBC done prior to visit--WBC slightly lower, reassuring that it isn't rising. f/u with surg as schedule - Plan: CBC with Differential/Platelet  Need for influenza vaccination - Plan: Flu Vaccine QUAD High Dose(Fluad)  Essential hypertension, benign - some lower BP's and dizziness. Discussed adequate hydration. Trial of decreasing atenolol to 12.'5mg'$  (and if BP remains low, trial off). cont lisinopril - Plan: atenolol (TENORMIN) 25 MG  tablet  Performance anxiety - beta blocker was prescribed both for HTN and anxiety (rather than short-acting propranolol prn only). BP well controlled, no longer having anxiety, cont atenolo - Plan: atenolol (TENORMIN) 25 MG tablet  Essential hypertension, benign - controlled; continue current regimen, regular exercise, low Na diet - Plan: atenolol (TENORMIN) 25 MG tablet  Cut the atenolol tablet in half, taking 1/2 tablet daily (instead of a full pill). Continue the Lisinopril '20mg'$  daily. Continue to monitor your blood pressure. If after 2-3 weeks your blood pressure remains <120/60, then you can try stopping the atenolol entirely. Goal is to have your blood pressure remain under 130/80.  If it goes above that when you stop the atenolol, we may need to restart it.  If you develop fever, worsening abdominal pain, nausea/vomiting, or other new/concerning symptoms, please seek evaluation right away. It is reassuring that your WBC is not increasing.  Follow-up with surgery as scheduled.

## 2022-07-02 NOTE — Patient Instructions (Addendum)
Cut the atenolol tablet in half, taking 1/2 tablet daily (instead of a full pill). Continue the Lisinopril '20mg'$  daily. Continue to monitor your blood pressure. If after 2-3 weeks your blood pressure remains <120/60, then you can try stopping the atenolol entirely. Goal is to have your blood pressure remain under 130/80.  If it goes above that when you stop the atenolol, we may need to restart it.  If you develop fever, worsening abdominal pain, nausea/vomiting, or other new/concerning symptoms, please seek evaluation right away. It is reassuring that your WBC is not increasing.  Follow-up with surgery as scheduled.

## 2022-07-03 ENCOUNTER — Ambulatory Visit: Payer: Medicare Other | Admitting: Family Medicine

## 2022-07-14 ENCOUNTER — Ambulatory Visit: Payer: Self-pay | Admitting: Surgery

## 2022-07-14 NOTE — Progress Notes (Unsigned)
No chief complaint on file.  Jeremy Bush is a 66 y.o. male who presents for Medicare Annual Wellness visit and follow-up on chronic medical conditions.    Recent appendicitis with rupture/abscess.  He had visit with Dr. Johney [place] earlier this week. Plan is for outpatient laparoscopic appendectomy, though there is a chance he would need an ileocolectomy. They had discussed possibly getting colonoscopy the day prior (to ensure no tumor or anything else underlying going on), and will be addressing this with Dr. Michail Sermon.  He had prostatitis in 10/2021. Denies any further urinary complaints.  Hypertension: At his visit 9/6 he had noted some lower BP's and some dizziness.  We discussed adequate hydration, and a trial of lowering the atenolol to 12.5 mg (1/2 tablet). He continues on lisinopril. He denies side effects. BP's since lower atenolol have been running   BP Readings from Last 3 Encounters:  07/02/22 118/78  06/26/22 120/80  06/24/22 125/79   He denies headaches, dizziness, chest pain, cough.  Follows low sodium diet.  He is getting regular exercise, though not back to his usual routine [facility]) due to recent hospitalization/appendicitis.   Impaired fasting glucose--he tries to follow a healthy diet, limiting carbs, sweets, sugar and getting regular exercise. Avoiding carbs at home, and really has tried eliminating them from diet entirely. Very rare desserts. Last A1c was 5.7% in December 2022. Alcohol intake fluctuates up and down.  Having 2-3 glasses at a time, not daily.   Hyperlipidemia follow-up:  Patient is reportedly following a low-fat, low cholesterol diet.  Compliant with medications ('40mg'$  atorvastatin) and denies medication side effects.   Prior to last lipid check he had been eating red meat more frequently elk), steak infrequently.  Mainly eats egg whites, rare yolk. Pt also has aortic atherosclerosis, noted on recent CT scans (05/2022).  Lab Results  Component  Value Date   CHOL 192 10/10/2021   HDL 59 10/10/2021   LDLCALC 116 (H) 10/10/2021   TRIG 92 10/10/2021   CHOLHDL 3.3 10/10/2021    Gout--He denies any gout flares. Prior gout was foot/ankle/toes.  Compliant with '100mg'$  of allopurinol without side effects. Lab Results  Component Value Date   LABURIC 6.4 01/14/2021     Anxiety:  He uses the alprazolam as needed, when his mind is worrying, thinking too much about things, and also uses with flying.  Takes 1/2 tablet at a time. Sometimes will go a week without any.   Last refilled #30 on 04/01/22, rx's last for 3 months at a time.   Prostate cancer--Previously monitored by Dr. Karsten Ro.  S/p radioactive seed implant, and has done very well.  He was released to PCP to follow PSA's, and to see him urologist only. Last PSA was undetectable in 12/2020, due for recheck.  Hypertension:  He reports compliance with his medication regimen of Atenolol '25mg'$  and lisinopril '20mg'$ .  He takes them both first thing in the morning and denies side effects.   BP's are running   He denies headaches, dizziness, chest pain, cough.  Follows low sodium diet.   Impaired fasting glucose--he tries to follow a healthy diet, limiting carbs, sweets, sugar and getting regular exercise. Avoiding carbs at home, but will eat them in the restaurants.   Rare desserts. Alcohol--2/day. Recently cut back during the week, but making up for it on the weekends.   GERD--much improved. Hasn't need to use any Prilosec in a while. A month ago he had very slight symptoms immediately after red wine and  steak--took 2 prilosec immediately, and helped. Red meat and red wine together seems to cause reflux, and he used to remember to use Prilosec prior to eating that.    Hyperlipidemia follow-up:  Patient is reportedly following a low-fat, low cholesterol diet.  Compliant with medications ('40mg'$  atorvastatin) and denies medication side effects.  He had labs done prior to visit, see below. Denies  missed doses of medication.  Eating steak once a week at most.  Eating a lot more eggs, 4-5/week, some bacon (only when going out).   Gout--He denies any gout flares. Prior gout was foot/ankle/toes.  Compliant with '100mg'$  of allopurinol without side effects.   Anxiety:  He uses the alprazolam as needed, when his mind is worrying, thinking too much about things, and also used with flying.  Takes 1/2 tablet at a time. Some weeks needs it a few days in a row, others, not much. A prescription lasts 2-3 months. Last filled 01/02/21 #30, prior fill was 10/10/20.  Prostate cancer--Previously monitored by Dr. Karsten Ro.  S/p radioactive seed implant, and has done very well.  He last saw Dr. Karsten Ro in 12/2019, PSA was 0.027. He was told to f/u with him prn only, and can have his PSA monitored with annual labs (see below).   Immunization History  Administered Date(s) Administered   Fluad Quad(high Dose 65+) 07/02/2022   Influenza Split 07/22/2011, 07/29/2012   Influenza, High Dose Seasonal PF 08/01/2020   Influenza,inj,Quad PF,6+ Mos 07/28/2013, 08/02/2014, 11/01/2015, 06/25/2016, 09/15/2017, 08/04/2018   Influenza-Unspecified 09/14/2019, 10/07/2021   PFIZER(Purple Top)SARS-COV-2 Vaccination 12/01/2019, 12/22/2019, 08/01/2020   Pfizer Covid-19 Vaccine Bivalent Booster 72yr & up 10/07/2021   Pneumococcal Conjugate-13 04/07/2019   Pneumococcal Polysaccharide-23 04/23/2020   Td 09/15/2005   Tdap 01/13/2013   Zoster, Live 08/02/2014   Last colonoscopy: 10/2017 hyperplastic polyp and hemorrhoids.  (h/o adenomatous polyps).  Dr. SMichail Sermon Last PSA:  Lab Results  Component Value Date   PSA1 <0.1 01/14/2021   PSA 4.52 (H) 01/12/2012   PSA 3.66 07/11/2011   PSA 3.51 05/21/2011   Dentist: 4 times yearly (for cleanings) Ophtho: yearly Exercise:  He is working back up to his normal routine, since hospitalization. Typical routine: Works with pPhysiological scientist1 hour 3x/week, 80% strength training, 20%  conditioning (core work and cardio).   Walking an hour daily (with dog, frequent stops sometimes).  Tennis, pickleball  Patient Care Team: KRita Ohara MD as PCP - General (Family Medicine) SWilford Corner MD as Consulting Physician (Gastroenterology) Urologist: Dr. OKarsten Ro(retired) Dentist: Dr. KChristie NottinghamOphtho: [place] Ophtho, Dr. BValetta CloseSurgeon: Dr. GJohney [place] Depression Screening: Flowsheet Row [place] Total Score 0         Falls screen:     06/12/2022    1:20 PM 11/06/2021   11:22 AM 01/16/2021    2:48 PM 04/07/2019    9:55 AM 06/03/2018   10:31 AM  FMauryin the past year? 1 0 0 0 No  Number falls in past yr: 0 0     Comment 06/09/22      Injury with Fall? 0 0     Comment tripped while hiking and caught his fall with left hand, laceration to left hand.      Risk for fall due to : History of fall(s) No Fall Risks     Follow up Falls evaluation completed Falls evaluation completed        Functional Status Survey:  End of Life Discussion:  Patient does not have a living will and medical power of attorney. Paperwork given in the past (twice)   PMH, PSH, SH and FH were reviewed and updated.   ROS: The patient denies anorexia, fever, headaches, vision loss, decreased hearing, ear pain, hoarseness, chest pain, palpitations, dizziness, syncope, dyspnea on exertion, cough, swelling, nausea, vomiting, diarrhea, constipation, abdominal pain, melena, hematochezia, hematuria, incontinence, erectile dysfunction, nocturia, weakened urine stream, dysuria, genital lesions, joint pains, numbness, tingling, weakness, tremor, suspicious skin lesions, depression, abnormal bleeding/bruising, or enlarged lymph nodes.  Anxiety per HPI, occasional. RLQ abdominal pain resolved.    PHYSICAL EXAM:  There were no vitals taken for this visit.  Wt Readings from Last 3 Encounters:  07/02/22 169 lb 6.4 oz (76.8 kg)  06/26/22  170 lb 6.4 oz (77.3 kg)  06/18/22 168 lb 3.4 oz (76.3 kg)    General Appearance:   Alert, cooperative, no distress, appears stated age    Head:   Normocephalic, without obvious abnormality, atraumatic    Eyes:   PERRL, conjunctiva/corneas clear, EOM's intact, fundi benign    Ears:   Normal TM's and external ear canals    Nose:   Normal no drainage or sinus tenderness  Throat:   Normal mucosa, no lesions  Neck:   Supple, no lymphadenopathy; thyroid: no enlargement/ tenderness/nodules; no carotid bruit or JVD    Back:   Spine nontender, no curvature, ROM normal, no CVA tenderness    Lungs:   Clear to auscultation bilaterally without wheezes, rales or ronchi; respirations unlabored    Chest Wall:   No tenderness or deformity    Heart:   Regular rate and rhythm, S1 and S2 normal, no murmur, rub or gallop    Breast Exam:   No chest wall tenderness, masses or gynecomastia    Abdomen:   Soft, non-tender, nondistended, normoactive bowel sounds, no masses, no hepatosplenomegaly    Genitalia:   Normal, no lesions. Testicles descended, normal, no masses. No inguinal hernias  Rectal:   Normal sphincter tone, no masses.  Prostate is smooth, mildly enlarged, no nodules.  Heme negative stool  Extremities:   No clubbing, cyanosis or edema    Pulses:   2+ and symmetric all extremities    Skin:   Skin color, texture, turgor normal. No suspicious lesions  Lymph nodes:   Cervical, supraclavicular, and axillary nodes normal    Neurologic:   Normal strength, sensation and gait; reflexes 2+ and symmetric throughout                          Psych:  Normal mood, affect, hygiene and grooming     ASSESSMENT/PLAN:   Did he ever get shingrix from the pharmacy? Should have list of BP's since his last visit, where he started cutting the atenolol dose in half.   COVID booster, RSV Tdap will be due 12/2022.  Uric acid, PSA, lipids, A1c (drawn with labs)  ?need xanax RF (has been 3 mos) Other meds RF'd in  July for 90d  Recommended at least 30 minutes of aerobic activity at least 5 days/week, weight-bearing exercise at least 2x/week; proper sunscreen use reviewed; healthy diet and alcohol recommendations (less than or equal to 2 drinks/day) reviewed; regular seatbelt use; changing batteries in smoke detectors. Immunization recommendations discussed--continue high dose flu shots yearly (already got); new RSV vaccine recommended/discussed; recommended COVID booster when available, Shingrix recommended, to get from pharmacy. Colonoscopy is UTD, though  Dr. Johney [place] will be discussing with Dr. Michail Sermon whether this can be done again prior to appendectomy.  MOST form reviewed, filled out.  Full Code, Full Care He was reminded to complete paperwork for Living Will, Healthcare POA , and to get [place] copies when completed.    Medicare Attestation I have personally reviewed: The patient's medical and social history Their use of alcohol, tobacco or illicit drugs Their current medications and supplements The patient's functional ability including ADLs,fall risks, home safety risks, cognitive, and hearing and visual impairment Diet and physical activities Evidence for depression or mood disorders  The patient's weight, height, and BMI have been recorded in the chart.  I have made referrals, counseling, and provided education to the patient based on review of the above and I have provided the patient with a written personalized care plan for preventive services.     Vikki Ports, MD

## 2022-07-15 NOTE — Patient Instructions (Signed)
  HEALTH MAINTENANCE RECOMMENDATIONS:  It is recommended that you get at least 30 minutes of aerobic exercise at least 5 days/week (for weight loss, you may need as much as 60-90 minutes). This can be any activity that gets your heart rate up. This can be divided in 10-15 minute intervals if needed, but try and build up your endurance at least once a week.  Weight bearing exercise is also recommended twice weekly.  Eat a healthy diet with lots of vegetables, fruits and fiber.  "Colorful" foods have a lot of vitamins (ie green vegetables, tomatoes, red peppers, etc).  Limit sweet tea, regular sodas and alcoholic beverages, all of which has a lot of calories and sugar.  Up to 2 alcoholic drinks daily may be beneficial for men (unless trying to lose weight, watch sugars).  Drink a lot of water.  Sunscreen of at least SPF 30 should be used on all sun-exposed parts of the skin when outside between the hours of 10 am and 4 pm (not just when at beach or pool, but even with exercise, golf, tennis, and yard work!)  Use a sunscreen that says "broad spectrum" so it covers both UVA and UVB rays, and make sure to reapply every 1-2 hours.  Remember to change the batteries in your smoke detectors when changing your clock times in the spring and fall.  Carbon monoxide detectors are recommended for your home.  Use your seat belt every time you are in a car, and please drive safely and not be distracted with cell phones and texting while driving.    Jeremy Bush , Thank you for taking time to come for your Medicare Wellness Visit. I appreciate your ongoing commitment to your health goals. Please review the following plan we discussed and let me know if I can assist you in the future.   This is a list of the screening recommended for you and due dates:  Health Maintenance  Topic Date Due   Zoster (Shingles) Vaccine (1 of 2) Never done   COVID-19 Vaccine (5 - Pfizer risk series) 12/02/2021   Tetanus Vaccine   01/14/2023   Colon Cancer Screening  11/06/2027   Pneumonia Vaccine  Completed   Flu Shot  Completed   Hepatitis C Screening: USPSTF Recommendation to screen - Ages 18-79 yo.  Completed   HPV Vaccine  Aged Out   Please bring [place] copies of your Living Will and [place] once completed and notarized so that it can be scanned into your medical chart.  I recommend getting the RSV vaccine--you will need to get this from the pharmacy.  I recommend you get the updated COVID booster, when it is available this Fall.  These vaccines should be separated by 2 weeks.  I recommend getting the new shingles vaccine (Shingrix). Since you have Medicare, you will need to get this from the pharmacy, as it is covered by Part D. This is a series of 2 injections, spaced 2 months apart.   This should be separated from other vaccines by at least 2 weeks.  You will be due for a tetanus booster (TdaP) in March 2024.  You will need to get this from the pharmacy.

## 2022-07-16 ENCOUNTER — Encounter: Payer: Self-pay | Admitting: Family Medicine

## 2022-07-16 ENCOUNTER — Ambulatory Visit (INDEPENDENT_AMBULATORY_CARE_PROVIDER_SITE_OTHER): Payer: Medicare Other | Admitting: Family Medicine

## 2022-07-16 VITALS — BP 120/70 | HR 60 | Ht 70.0 in | Wt 171.2 lb

## 2022-07-16 DIAGNOSIS — E7800 Pure hypercholesterolemia, unspecified: Secondary | ICD-10-CM | POA: Diagnosis not present

## 2022-07-16 DIAGNOSIS — K3532 Acute appendicitis with perforation and localized peritonitis, without abscess: Secondary | ICD-10-CM

## 2022-07-16 DIAGNOSIS — Z0000 Encounter for general adult medical examination without abnormal findings: Secondary | ICD-10-CM | POA: Diagnosis not present

## 2022-07-16 DIAGNOSIS — I10 Essential (primary) hypertension: Secondary | ICD-10-CM | POA: Diagnosis not present

## 2022-07-16 DIAGNOSIS — Z5181 Encounter for therapeutic drug level monitoring: Secondary | ICD-10-CM

## 2022-07-16 DIAGNOSIS — M109 Gout, unspecified: Secondary | ICD-10-CM

## 2022-07-16 DIAGNOSIS — I700 Atherosclerosis of aorta: Secondary | ICD-10-CM | POA: Diagnosis not present

## 2022-07-16 DIAGNOSIS — F411 Generalized anxiety disorder: Secondary | ICD-10-CM

## 2022-07-16 DIAGNOSIS — Z8546 Personal history of malignant neoplasm of prostate: Secondary | ICD-10-CM

## 2022-07-16 DIAGNOSIS — R7301 Impaired fasting glucose: Secondary | ICD-10-CM

## 2022-07-17 LAB — LIPID PANEL
Chol/HDL Ratio: 2.9 ratio (ref 0.0–5.0)
Cholesterol, Total: 227 mg/dL — ABNORMAL HIGH (ref 100–199)
HDL: 77 mg/dL (ref >39)
LDL Chol Calc (NIH): 135 mg/dL — ABNORMAL HIGH (ref 0–99)
Triglycerides: 88 mg/dL (ref 0–149)
VLDL Cholesterol Cal: 15 mg/dL (ref 5–40)

## 2022-07-17 LAB — PSA: Prostate Specific Ag, Serum: <0.1 ng/mL (ref 0.0–4.0)

## 2022-07-17 LAB — HEMOGLOBIN A1C
Est. average glucose Bld gHb Est-mCnc: 120 mg/dL
Hgb A1c MFr Bld: 5.8 % — ABNORMAL HIGH (ref 4.8–5.6)

## 2022-07-17 LAB — URIC ACID: Uric Acid: 6 mg/dL (ref 3.8–8.4)

## 2022-08-05 ENCOUNTER — Encounter: Payer: Self-pay | Admitting: Internal Medicine

## 2022-08-14 ENCOUNTER — Other Ambulatory Visit: Payer: Self-pay | Admitting: Family Medicine

## 2022-08-14 DIAGNOSIS — M109 Gout, unspecified: Secondary | ICD-10-CM

## 2022-08-14 DIAGNOSIS — E7800 Pure hypercholesterolemia, unspecified: Secondary | ICD-10-CM

## 2022-08-14 DIAGNOSIS — F418 Other specified anxiety disorders: Secondary | ICD-10-CM

## 2022-08-14 DIAGNOSIS — I10 Essential (primary) hypertension: Secondary | ICD-10-CM

## 2022-09-10 NOTE — Patient Instructions (Addendum)
DUE TO COVID-19 ONLY TWO VISITORS  (aged 68 and older)  ARE ALLOWED TO COME WITH YOU AND STAY IN THE WAITING ROOM ONLY DURING PRE OP AND PROCEDURE.   **NO VISITORS ARE ALLOWED IN THE SHORT STAY AREA OR RECOVERY ROOM!!**  IF YOU WILL BE ADMITTED INTO THE HOSPITAL YOU ARE ALLOWED ONLY FOUR SUPPORT PEOPLE DURING VISITATION HOURS ONLY (7 AM -8PM)   The support person(s) must pass our screening, gel in and out, and wear a mask at all times, including in the patient's room. Patients must also wear a mask when staff or their support person are in the room. Visitors GUEST BADGE MUST BE WORN VISIBLY  One adult visitor may remain with you overnight and MUST be in the room by 8 P.M.     Your procedure is scheduled on: 09/24/22   Report to [facility] Main Entrance    Report to admitting at: 11:15 AM   Call this number if you have problems the morning of surgery [phone]   Clear liquids starting the day before surgery until : 10:30 AM DAY OF SURGERY. Drink plenty fluids the day of your prep.  Water Black Coffee (sugar ok, NO MILK/CREAM OR CREAMERS)  Tea (sugar ok, NO MILK/CREAM OR CREAMERS) regular and decaf                             Plain Jell-O (NO RED)                                           Fruit ices (not with fruit pulp, NO RED)                                     Popsicles (NO RED)                                                                  Juice: apple, WHITE grape, WHITE cranberry Sports drinks like Gatorade (NO RED)              DRINK 2 PRESURGERY ENSURE DRINKS THE NIGHT BEFORE SURGERY AT  1000 PM AND 1 PRESURGERY DRINK THE DAY OF THE PROCEDURE 3 HOURS PRIOR TO SCHEDULED SURGERY. NO SOLIDS AFTER MIDNIGHT THE DAY PRIOR TO THE SURGERY. NOTHING BY MOUTH EXCEPT CLEAR LIQUIDS UNTIL THREE HOURS PRIOR TO SCHEDULED SURGERY. PLEASE FINISH PRESURGERY ENSURE DRINK PER SURGEON ORDER 3 HOURS PRIOR TO SCHEDULED SURGERY TIME WHICH NEEDS TO BE COMPLETED AT : 10:30 AM.   . Drink in  one sitting. Do not sip.  This drink was given to you during your hospital  pre-op appointment visit. Nothing else to drink after completing the  Pre-Surgery Clear Ensure or G2.          If you have questions, please contact your surgeon's office.   FOLLOW BOWEL PREP AND ANY ADDITIONAL PRE OP INSTRUCTIONS YOU RECEIVED FROM YOUR SURGEON'S OFFICE!!!   Oral Hygiene is also important to reduce your risk of infection.  Remember - BRUSH YOUR TEETH THE MORNING OF SURGERY WITH YOUR REGULAR TOOTHPASTE   Do NOT smoke after Midnight   Take these medicines the morning of surgery with A SIP OF WATER: atenolol,allopurinol,omeprazole.Alprazolam as needed.                              You may not have any metal on your body including hair pins, jewelry, and body piercing             Do not wear lotions, powders, perfumes/cologne, or deodorant.              Men may shave face and neck.   Do not bring valuables to the hospital. [place].   Contacts, dentures or bridgework may not be worn into surgery.   Bring small overnight bag day of surgery.   DO NOT [place]. PHARMACY WILL DISPENSE MEDICATIONS LISTED ON YOUR MEDICATION LIST TO YOU DURING YOUR ADMISSION [place]!    Patients discharged on the day of surgery will not be allowed to drive home.  Someone NEEDS to stay with you for the first 24 hours after anesthesia.   Special Instructions: Bring a copy of your healthcare power of attorney and living will documents         the day of surgery if you haven't scanned them before.              Please read over the following fact sheets you were given: IF YOU HAVE QUESTIONS ABOUT YOUR PRE-OP INSTRUCTIONS PLEASE CALL [phone]    [facility] - Preparing for Surgery Before surgery, you can play an important role.  Because skin is not sterile, your skin needs to be as free of  germs as possible.  You can reduce the number of germs on your skin by washing with CHG (chlorahexidine gluconate) soap before surgery.  CHG is an antiseptic cleaner which kills germs and bonds with the skin to continue killing germs even after washing. Please DO NOT use if you have an allergy to CHG or antibacterial soaps.  If your skin becomes reddened/irritated stop using the CHG and inform your nurse when you arrive at Short Stay. Do not shave (including legs and underarms) for at least 48 hours prior to the first CHG shower.  You may shave your face/neck. Please follow these instructions carefully:  1.  Shower with CHG Soap the night before surgery and the  morning of Surgery.  2.  If you choose to wash your hair, wash your hair first as usual with your  normal  shampoo.  3.  After you shampoo, rinse your hair and body thoroughly to remove the  shampoo.                           4.  Use CHG as you would any other liquid soap.  You can apply chg directly  to the skin and wash                       Gently with a scrungie or clean washcloth.  5.  Apply the CHG Soap to your body ONLY FROM THE NECK DOWN.   Do not use on face/ open  Wound or open sores. Avoid contact with eyes, ears mouth and genitals (private parts).                       Wash face,  Genitals (private parts) with your normal soap.             6.  Wash thoroughly, paying special attention to the area where your surgery  will be performed.  7.  Thoroughly rinse your body with warm water from the neck down.  8.  DO NOT shower/wash with your normal soap after using and rinsing off  the CHG Soap.                9.  Pat yourself dry with a clean towel.            10.  Wear clean pajamas.            11.  Place clean sheets on your bed the night of your first shower and do not  sleep with pets. Day of Surgery : Do not apply any lotions/deodorants the morning of surgery.  Please wear clean clothes to the  hospital/surgery center.  FAILURE TO FOLLOW THESE INSTRUCTIONS MAY RESULT IN THE CANCELLATION OF YOUR SURGERY PATIENT SIGNATURE_________________________________  NURSE SIGNATURE__________________________________  ________________________________________________________________________   Adam Phenix  An incentive spirometer is a tool that can help keep your lungs clear and active. This tool measures how well you are filling your lungs with each breath. Taking long deep breaths may help reverse or decrease the chance of developing breathing (pulmonary) problems (especially infection) following: A long period of time when you are unable to move or be active. BEFORE THE PROCEDURE  If the spirometer includes an indicator to show your best effort, your nurse or respiratory therapist will set it to a desired goal. If possible, sit up straight or lean slightly forward. Try not to slouch. Hold the incentive spirometer in an upright position. INSTRUCTIONS FOR USE  Sit on the edge of your bed if possible, or sit up as far as you can in bed or on a chair. Hold the incentive spirometer in an upright position. Breathe out normally. Place the mouthpiece in your mouth and seal your lips tightly around it. Breathe in slowly and as deeply as possible, raising the piston or the ball toward the top of the column. Hold your breath for 3-5 seconds or for as long as possible. Allow the piston or ball to fall to the bottom of the column. Remove the mouthpiece from your mouth and breathe out normally. Rest for a few seconds and repeat Steps 1 through 7 at least 10 times every 1-2 hours when you are awake. Take your time and take a few normal breaths between deep breaths. The spirometer may include an indicator to show your best effort. Use the indicator as a goal to work toward during each repetition. After each set of 10 deep breaths, practice coughing to be sure your lungs are clear. If you have an  incision (the cut made at the time of surgery), support your incision when coughing by placing a pillow or rolled up towels firmly against it. Once you are able to get out of bed, walk around indoors and cough well. You may stop using the incentive spirometer when instructed by your caregiver.  RISKS AND COMPLICATIONS Take your time so you do not get dizzy or light-headed. If you are in pain, you may need to take or  ask for pain medication before doing incentive spirometry. It is harder to take a deep breath if you are having pain. AFTER USE Rest and breathe slowly and easily. It can be helpful to keep track of a log of your progress. Your caregiver can provide you with a simple table to help with this. If you are using the spirometer at home, follow these instructions: [place] IF:  You are having difficultly using the spirometer. You have trouble using the spirometer as often as instructed. Your pain medication is not giving enough relief while using the spirometer. You develop fever of 100.5 F (38.1 C) or higher. SEEK IMMEDIATE MEDICAL CARE IF:  You cough up bloody sputum that had not been present before. You develop fever of 102 F (38.9 C) or greater. You develop worsening pain at or near the incision site. MAKE SURE YOU:  Understand these instructions. Will watch your condition. Will get help right away if you are not doing well or get worse. Document Released: 02/23/2007 Document Revised: 01/05/2012 Document Reviewed: 04/26/2007 [facility] Patient Information 2014 [place], [place].   ________________________________________________________________________

## 2022-09-11 ENCOUNTER — Encounter (HOSPITAL_COMMUNITY): Payer: Self-pay

## 2022-09-11 ENCOUNTER — Encounter (HOSPITAL_COMMUNITY)
Admission: RE | Admit: 2022-09-11 | Discharge: 2022-09-11 | Disposition: A | Discharge status: Home / Self Care | Payer: Medicare Other | Source: Ambulatory Visit | Attending: Surgery | Admitting: Surgery

## 2022-09-11 ENCOUNTER — Other Ambulatory Visit: Payer: Self-pay

## 2022-09-11 VITALS — BP 138/88 | HR 68 | Temp 97.8°F | Ht 71.0 in | Wt 180.0 lb

## 2022-09-11 DIAGNOSIS — Z01818 Encounter for other preprocedural examination: Secondary | ICD-10-CM

## 2022-09-11 DIAGNOSIS — I10 Essential (primary) hypertension: Secondary | ICD-10-CM | POA: Diagnosis not present

## 2022-09-11 HISTORY — DX: Prediabetes: R73.03

## 2022-09-11 LAB — CBC
HCT: 44.3 % (ref 39.0–52.0)
Hemoglobin: 14.8 g/dL (ref 13.0–17.0)
MCH: 31.2 pg (ref 26.0–34.0)
MCHC: 33.4 g/dL (ref 30.0–36.0)
MCV: 93.3 fL (ref 80.0–100.0)
Platelets: 285 10*3/uL (ref 150–400)
RBC: 4.75 MIL/uL (ref 4.22–5.81)
RDW: 12.9 % (ref 11.5–15.5)
WBC: 7.9 10*3/uL (ref 4.0–10.5)
nRBC: 0 % (ref 0.0–0.2)

## 2022-09-11 LAB — COMPREHENSIVE METABOLIC PANEL
ALT: 31 U/L (ref 0–44)
AST: 24 U/L (ref 15–41)
Albumin: 4.1 g/dL (ref 3.5–5.0)
Alkaline Phosphatase: 62 U/L (ref 38–126)
Anion gap: 6 (ref 5–15)
BUN: 19 mg/dL (ref 8–23)
CO2: 25 mmol/L (ref 22–32)
Calcium: 9.3 mg/dL (ref 8.9–10.3)
Chloride: 106 mmol/L (ref 98–111)
Creatinine, Ser: 1.03 mg/dL (ref 0.61–1.24)
GFR, Estimated: >60 mL/min (ref >60)
Glucose, Bld: 108 mg/dL — ABNORMAL HIGH (ref 70–99)
Potassium: 4.5 mmol/L (ref 3.5–5.1)
Sodium: 137 mmol/L (ref 135–145)
Total Bilirubin: 1.1 mg/dL (ref 0.3–1.2)
Total Protein: 7 g/dL (ref 6.5–8.1)

## 2022-09-11 LAB — TYPE AND SCREEN
ABO/RH(D): B NEG
Antibody Screen: NEGATIVE

## 2022-09-11 NOTE — Progress Notes (Signed)
For Short Stay: [place] appointment date:  Bowel Prep reminder:   For Anesthesia: PCP - Dr. Rita Ohara. LOV: 07/02/22 Cardiologist -   Chest x-ray -  EKG -  Stress Test -  ECHO -  Cardiac Cath -  Pacemaker/ICD device last checked: Pacemaker orders received: Device Rep notified:  Spinal Cord Stimulator:  Sleep Study -  CPAP -   Fasting Blood Sugar -  Checks Blood Sugar _____ times a day Date and result of last Hgb A1c-  Last dose of GLP1 agonist-  GLP1 instructions:   Last dose of SGLT-2 inhibitors-  SGLT-2 instructions:   Blood Thinner Instructions: Aspirin Instructions: Last Dose:  Activity level: Can go up a flight of stairs and activities of daily living without stopping and without chest pain and/or shortness of breath   Able to exercise without chest pain and/or shortness of breath   Unable to go up a flight of stairs without chest pain and/or shortness of breath     Anesthesia review:   Patient denies shortness of breath, fever, cough and chest pain at PAT appointment   Patient verbalized understanding of instructions that were given to them at the PAT appointment. Patient was also instructed that they will need to review over the PAT instructions again at home before surgery.

## 2022-09-24 ENCOUNTER — Other Ambulatory Visit: Payer: Self-pay

## 2022-09-24 ENCOUNTER — Inpatient Hospital Stay (HOSPITAL_COMMUNITY): Payer: Medicare Other | Admitting: Certified Registered Nurse Anesthetist

## 2022-09-24 ENCOUNTER — Ambulatory Visit (HOSPITAL_COMMUNITY)
Admission: RE | Admit: 2022-09-24 | Discharge: 2022-09-24 | Disposition: A | Discharge status: Home / Self Care | Payer: Medicare Other | Source: Ambulatory Visit | Attending: Surgery | Admitting: Surgery

## 2022-09-24 ENCOUNTER — Encounter (HOSPITAL_COMMUNITY)
Admission: RE | Disposition: A | Discharge status: Home / Self Care | Payer: Self-pay | Source: Ambulatory Visit | Attending: Surgery

## 2022-09-24 ENCOUNTER — Encounter (HOSPITAL_COMMUNITY): Payer: Self-pay | Admitting: Surgery

## 2022-09-24 DIAGNOSIS — K388 Other specified diseases of appendix: Secondary | ICD-10-CM | POA: Diagnosis not present

## 2022-09-24 DIAGNOSIS — K660 Peritoneal adhesions (postprocedural) (postinfection): Secondary | ICD-10-CM | POA: Insufficient documentation

## 2022-09-24 DIAGNOSIS — D373 Neoplasm of uncertain behavior of appendix: Secondary | ICD-10-CM | POA: Insufficient documentation

## 2022-09-24 DIAGNOSIS — I10 Essential (primary) hypertension: Secondary | ICD-10-CM | POA: Diagnosis not present

## 2022-09-24 DIAGNOSIS — K219 Gastro-esophageal reflux disease without esophagitis: Secondary | ICD-10-CM | POA: Insufficient documentation

## 2022-09-24 DIAGNOSIS — K429 Umbilical hernia without obstruction or gangrene: Secondary | ICD-10-CM | POA: Diagnosis not present

## 2022-09-24 DIAGNOSIS — Z8719 Personal history of other diseases of the digestive system: Secondary | ICD-10-CM | POA: Diagnosis not present

## 2022-09-24 DIAGNOSIS — D490 Neoplasm of unspecified behavior of digestive system: Secondary | ICD-10-CM | POA: Insufficient documentation

## 2022-09-24 DIAGNOSIS — Z79899 Other long term (current) drug therapy: Secondary | ICD-10-CM | POA: Diagnosis not present

## 2022-09-24 DIAGNOSIS — K36 Other appendicitis: Secondary | ICD-10-CM

## 2022-09-24 HISTORY — PX: LAPAROSCOPIC APPENDECTOMY: SHX408

## 2022-09-24 HISTORY — PX: UMBILICAL HERNIA REPAIR: SHX196

## 2022-09-24 LAB — ABO/RH: ABO/RH(D): B NEG

## 2022-09-24 SURGERY — APPENDECTOMY, LAPAROSCOPIC
Anesthesia: General | Site: Abdomen

## 2022-09-24 MED ORDER — NEOMYCIN SULFATE 500 MG PO TABS: 1000.0000 mg | ORAL_TABLET | ORAL | Status: DC

## 2022-09-24 MED ORDER — FENTANYL CITRATE (PF) 100 MCG/2ML IJ SOLN
INTRAMUSCULAR | Status: DC | PRN
  Administered 2022-09-24 (×7): 50 ug via INTRAVENOUS

## 2022-09-24 MED ORDER — SUGAMMADEX SODIUM 200 MG/2ML IV SOLN
INTRAVENOUS | Status: DC | PRN
  Administered 2022-09-24: 200 mg via INTRAVENOUS

## 2022-09-24 MED ORDER — GABAPENTIN 300 MG PO CAPS
300.0000 mg | ORAL_CAPSULE | ORAL | Status: AC
  Administered 2022-09-24: 300 mg via ORAL
  Filled 2022-09-24: qty 1

## 2022-09-24 MED ORDER — ONDANSETRON HCL 4 MG/2ML IJ SOLN
INTRAMUSCULAR | Status: AC
  Filled 2022-09-24: qty 2

## 2022-09-24 MED ORDER — ROCURONIUM BROMIDE 10 MG/ML (PF) SYRINGE
PREFILLED_SYRINGE | INTRAVENOUS | Status: AC
  Filled 2022-09-24: qty 10

## 2022-09-24 MED ORDER — DEXAMETHASONE SODIUM PHOSPHATE 10 MG/ML IJ SOLN
INTRAMUSCULAR | Status: AC
  Filled 2022-09-24: qty 1

## 2022-09-24 MED ORDER — LACTATED RINGERS IV SOLN: INTRAVENOUS | Status: DC

## 2022-09-24 MED ORDER — FENTANYL CITRATE (PF) 100 MCG/2ML IJ SOLN
INTRAMUSCULAR | Status: AC
  Filled 2022-09-24: qty 2

## 2022-09-24 MED ORDER — ENSURE PRE-SURGERY PO LIQD
592.0000 mL | Freq: Once | ORAL | Status: DC
  Filled 2022-09-24: qty 592

## 2022-09-24 MED ORDER — METRONIDAZOLE 500 MG PO TABS: 1000.0000 mg | ORAL_TABLET | ORAL | Status: DC

## 2022-09-24 MED ORDER — ROCURONIUM BROMIDE 10 MG/ML (PF) SYRINGE
PREFILLED_SYRINGE | INTRAVENOUS | Status: DC | PRN
  Administered 2022-09-24: 50 mg via INTRAVENOUS
  Administered 2022-09-24: 10 mg via INTRAVENOUS

## 2022-09-24 MED ORDER — EPHEDRINE SULFATE-NACL 50-0.9 MG/10ML-% IV SOSY
PREFILLED_SYRINGE | INTRAVENOUS | Status: DC | PRN
  Administered 2022-09-24: 7.5 mg via INTRAVENOUS

## 2022-09-24 MED ORDER — ACETAMINOPHEN 500 MG PO TABS
1000.0000 mg | ORAL_TABLET | ORAL | Status: AC
  Administered 2022-09-24: 1000 mg via ORAL
  Filled 2022-09-24: qty 2

## 2022-09-24 MED ORDER — EPHEDRINE 5 MG/ML INJ
INTRAVENOUS | Status: AC
  Filled 2022-09-24: qty 5

## 2022-09-24 MED ORDER — TRAMADOL HCL 50 MG PO TABS: 50.0000 mg | ORAL_TABLET | Freq: Four times a day (QID) | ORAL | 0 refills | Status: DC | PRN

## 2022-09-24 MED ORDER — PROPOFOL 10 MG/ML IV BOLUS
INTRAVENOUS | Status: AC
  Filled 2022-09-24: qty 20

## 2022-09-24 MED ORDER — ORAL CARE MOUTH RINSE: 15.0000 mL | Freq: Once | OROMUCOSAL | Status: AC

## 2022-09-24 MED ORDER — LIDOCAINE HCL (PF) 2 % IJ SOLN
INTRAMUSCULAR | Status: AC
  Filled 2022-09-24: qty 5

## 2022-09-24 MED ORDER — BUPIVACAINE HCL 0.5 % IJ SOLN
INTRAMUSCULAR | Status: DC | PRN
  Administered 2022-09-24: 30 mL

## 2022-09-24 MED ORDER — 0.9 % SODIUM CHLORIDE (POUR BTL) OPTIME
TOPICAL | Status: DC | PRN
  Administered 2022-09-24: 1000 mL

## 2022-09-24 MED ORDER — SODIUM CHLORIDE 0.9 % IV SOLN
2.0000 g | INTRAVENOUS | Status: AC
  Administered 2022-09-24: 2 g via INTRAVENOUS
  Filled 2022-09-24: qty 2

## 2022-09-24 MED ORDER — HYDROMORPHONE HCL 1 MG/ML IJ SOLN: 0.2500 mg | INTRAMUSCULAR | Status: DC | PRN

## 2022-09-24 MED ORDER — BUPIVACAINE LIPOSOME 1.3 % IJ SUSP
INTRAMUSCULAR | Status: DC | PRN
  Administered 2022-09-24: 20 mL

## 2022-09-24 MED ORDER — LIDOCAINE 2% (20 MG/ML) 5 ML SYRINGE
INTRAMUSCULAR | Status: DC | PRN
  Administered 2022-09-24: 60 mg via INTRAVENOUS

## 2022-09-24 MED ORDER — FENTANYL CITRATE (PF) 250 MCG/5ML IJ SOLN
INTRAMUSCULAR | Status: AC
  Filled 2022-09-24: qty 5

## 2022-09-24 MED ORDER — BUPIVACAINE LIPOSOME 1.3 % IJ SUSP: 20.0000 mL | Freq: Once | INTRAMUSCULAR | Status: DC

## 2022-09-24 MED ORDER — ENSURE PRE-SURGERY PO LIQD
296.0000 mL | Freq: Once | ORAL | Status: DC
  Filled 2022-09-24: qty 296

## 2022-09-24 MED ORDER — ALVIMOPAN 12 MG PO CAPS
12.0000 mg | ORAL_CAPSULE | ORAL | Status: AC
  Administered 2022-09-24: 12 mg via ORAL
  Filled 2022-09-24: qty 1

## 2022-09-24 MED ORDER — BISACODYL 5 MG PO TBEC: 20.0000 mg | DELAYED_RELEASE_TABLET | Freq: Once | ORAL | Status: DC

## 2022-09-24 MED ORDER — DEXAMETHASONE SODIUM PHOSPHATE 4 MG/ML IJ SOLN
INTRAMUSCULAR | Status: DC | PRN
  Administered 2022-09-24: 5 mg via INTRAVENOUS

## 2022-09-24 MED ORDER — BUPIVACAINE LIPOSOME 1.3 % IJ SUSP
INTRAMUSCULAR | Status: AC
  Filled 2022-09-24: qty 20

## 2022-09-24 MED ORDER — POLYETHYLENE GLYCOL 3350 17 GM/SCOOP PO POWD: 1.0000 | Freq: Once | ORAL | Status: DC

## 2022-09-24 MED ORDER — CHLORHEXIDINE GLUCONATE 0.12 % MT SOLN
15.0000 mL | Freq: Once | OROMUCOSAL | Status: AC
  Administered 2022-09-24: 15 mL via OROMUCOSAL

## 2022-09-24 MED ORDER — BUPIVACAINE HCL (PF) 0.5 % IJ SOLN
INTRAMUSCULAR | Status: AC
  Filled 2022-09-24: qty 30

## 2022-09-24 MED ORDER — PROPOFOL 10 MG/ML IV BOLUS
INTRAVENOUS | Status: DC | PRN
  Administered 2022-09-24: 170 mg via INTRAVENOUS
  Administered 2022-09-24: 30 mg via INTRAVENOUS

## 2022-09-24 MED ORDER — ENOXAPARIN SODIUM 40 MG/0.4ML IJ SOSY
40.0000 mg | PREFILLED_SYRINGE | Freq: Once | INTRAMUSCULAR | Status: AC
  Administered 2022-09-24: 40 mg via SUBCUTANEOUS
  Filled 2022-09-24: qty 0.4

## 2022-09-24 MED ORDER — ONDANSETRON HCL 4 MG/2ML IJ SOLN
INTRAMUSCULAR | Status: DC | PRN
  Administered 2022-09-24: 4 mg via INTRAVENOUS

## 2022-09-24 MED ORDER — LACTATED RINGERS IR SOLN
Status: DC | PRN
  Administered 2022-09-24: 1000 mL

## 2022-09-24 SURGICAL SUPPLY — 61 items
APL PRP STRL LF DISP 70% ISPRP (MISCELLANEOUS) ×2
APPLIER CLIP 5 13 M/L LIGAMAX5 (MISCELLANEOUS)
APPLIER CLIP ROT 10 11.4 M/L (STAPLE)
APR CLP MED LRG 11.4X10 (STAPLE)
APR CLP MED LRG 5 ANG JAW (MISCELLANEOUS)
BAG COUNTER SPONGE SURGICOUNT (BAG) IMPLANT
BAG SPEC RTRVL 10 TROC 200 (ENDOMECHANICALS) ×2
BAG SPNG CNTER NS LX DISP (BAG)
CABLE HIGH FREQUENCY MONO STRZ (ELECTRODE) ×2 IMPLANT
CHLORAPREP W/TINT 26 (MISCELLANEOUS) ×2 IMPLANT
CLIP APPLIE 5 13 M/L LIGAMAX5 (MISCELLANEOUS) IMPLANT
CLIP APPLIE ROT 10 11.4 M/L (STAPLE) IMPLANT
COVER SURGICAL LIGHT HANDLE (MISCELLANEOUS) ×2 IMPLANT
DEVICE TROCAR PUNCTURE CLOSURE (ENDOMECHANICALS) IMPLANT
DRAPE LAPAROSCOPIC ABDOMINAL (DRAPES) ×2 IMPLANT
DRAPE WARM FLUID 44X44 (DRAPES) ×2 IMPLANT
DRSG TEGADERM 2-3/8X2-3/4 SM (GAUZE/BANDAGES/DRESSINGS) ×4 IMPLANT
DRSG TEGADERM 4X4.75 (GAUZE/BANDAGES/DRESSINGS) ×2 IMPLANT
ELECT REM PT RETURN 15FT ADLT (MISCELLANEOUS) ×2 IMPLANT
ENDOLOOP SUT PDS II  0 18 (SUTURE)
ENDOLOOP SUT PDS II 0 18 (SUTURE) IMPLANT
GAUZE SPONGE 2X2 8PLY STRL LF (GAUZE/BANDAGES/DRESSINGS) ×2 IMPLANT
GLOVE ECLIPSE 8.0 STRL XLNG CF (GLOVE) ×2 IMPLANT
GLOVE INDICATOR 8.0 STRL GRN (GLOVE) ×2 IMPLANT
GOWN STRL REUS W/ TWL XL LVL3 (GOWN DISPOSABLE) ×2 IMPLANT
GOWN STRL REUS W/TWL XL LVL3 (GOWN DISPOSABLE) ×2
IRRIG SUCT STRYKERFLOW 2 WTIP (MISCELLANEOUS) ×2
IRRIGATION SUCT STRKRFLW 2 WTP (MISCELLANEOUS) ×2 IMPLANT
KIT BASIN OR (CUSTOM PROCEDURE TRAY) ×2 IMPLANT
KIT TURNOVER KIT A (KITS) IMPLANT
NDL INSUFFLATION 14GA 120MM (NEEDLE) IMPLANT
NEEDLE INSUFFLATION 14GA 120MM (NEEDLE) IMPLANT
PAD POSITIONING PINK XL (MISCELLANEOUS) ×2 IMPLANT
PENCIL SMOKE EVACUATOR (MISCELLANEOUS) IMPLANT
POUCH RETRIEVAL ECOSAC 10 (ENDOMECHANICALS) ×2 IMPLANT
POUCH RETRIEVAL ECOSAC 10MM (ENDOMECHANICALS) ×2
RELOAD STAPLE 60 3.6 BLU REG (STAPLE) IMPLANT
RELOAD STAPLE 60 4.1 GRN THCK (STAPLE) IMPLANT
RELOAD STAPLER BLUE 60MM (STAPLE) ×4 IMPLANT
RELOAD STAPLER GREEN 60MM (STAPLE) IMPLANT
SCISSORS LAP 5X35 DISP (ENDOMECHANICALS) ×2 IMPLANT
SEALER TISSUE G2 STRG ARTC 35C (ENDOMECHANICALS) IMPLANT
SET TUBE SMOKE EVAC HIGH FLOW (TUBING) ×2 IMPLANT
SHEARS 1100 HARMONIC 36 (ELECTROSURGICAL) IMPLANT
SLEEVE Z-THREAD 5X100MM (TROCAR) ×2 IMPLANT
SPIKE FLUID TRANSFER (MISCELLANEOUS) ×2 IMPLANT
STAPLE ECHEON FLEX 60 POW ENDO (STAPLE) IMPLANT
STAPLER RELOAD BLUE 60MM (STAPLE) ×4
STAPLER RELOAD GREEN 60MM (STAPLE)
SUT MNCRL AB 4-0 PS2 18 (SUTURE) ×2 IMPLANT
SUT PDS AB 0 CT1 36 (SUTURE) IMPLANT
SUT PDS AB 1 CT1 27 (SUTURE) IMPLANT
SUT SILK 2 0 SH (SUTURE) IMPLANT
TOWEL OR 17X26 10 PK STRL BLUE (TOWEL DISPOSABLE) ×2 IMPLANT
TRAY FOLEY MTR SLVR 14FR STAT (SET/KITS/TRAYS/PACK) ×2 IMPLANT
TRAY FOLEY MTR SLVR 16FR STAT (SET/KITS/TRAYS/PACK) IMPLANT
TRAY LAPAROSCOPIC (CUSTOM PROCEDURE TRAY) ×2 IMPLANT
TROCAR ADV FIXATION 12X100MM (TROCAR) IMPLANT
TROCAR ADV FIXATION 5X100MM (TROCAR) ×2 IMPLANT
TROCAR Z THREAD OPTICAL 12X100 (TROCAR) IMPLANT
TROCAR Z-THREAD OPTICAL 5X100M (TROCAR) ×2 IMPLANT

## 2022-09-24 NOTE — Anesthesia Preprocedure Evaluation (Signed)
Anesthesia Evaluation  Patient identified by MRN, date of birth, ID band Patient awake    Reviewed: Allergy & Precautions, NPO status , Patient's Chart, lab work & pertinent test results  Airway Mallampati: II       Dental   Pulmonary neg pulmonary ROS   breath sounds clear to auscultation       Cardiovascular hypertension,  Rhythm:Regular Rate:Normal     Neuro/Psych negative neurological ROS     GI/Hepatic Neg liver ROS,GERD  ,,History noted Dr. Nyoka Cowden   Endo/Other  negative endocrine ROS    Renal/GU negative Renal ROS     Musculoskeletal   Abdominal   Peds  Hematology   Anesthesia Other Findings   Reproductive/Obstetrics                             Anesthesia Physical Anesthesia Plan  ASA: 2  Anesthesia Plan: General   Post-op Pain Management: Tylenol PO (pre-op)*   Induction: Intravenous  PONV Risk Score and Plan: Ondansetron, Dexamethasone and Midazolam  Airway Management Planned: Oral ETT  Additional Equipment:   Intra-op Plan:   Post-operative Plan: Extubation in OR  Informed Consent: I have reviewed the patients History and Physical, chart, labs and discussed the procedure including the risks, benefits and alternatives for the proposed anesthesia with the patient or authorized representative who has indicated his/her understanding and acceptance.     Dental advisory given  Plan Discussed with: CRNA and Anesthesiologist  Anesthesia Plan Comments:        Anesthesia Quick Evaluation

## 2022-09-24 NOTE — Discharge Instructions (Signed)
SURGERY: POST OP INSTRUCTIONS (Surgery for small bowel obstruction, colon resection, etc)   ######################################################################  EAT Gradually transition to a high fiber diet with a fiber supplement over the next few days after discharge  WALK Walk an hour a day.  Control your pain to do that.    CONTROL PAIN Control pain so that you can walk, sleep, tolerate sneezing/coughing, go up/down stairs.  HAVE A BOWEL MOVEMENT DAILY Keep your bowels regular to avoid problems.  OK to try a laxative to override constipation.  OK to use an antidairrheal to slow down diarrhea.  Call if not better after 2 tries  CALL IF YOU HAVE PROBLEMS/CONCERNS Call if you are still struggling despite following these instructions. Call if you have concerns not answered by these instructions  ######################################################################   DIET Follow a light diet the first few days at home.  Start with a bland diet such as soups, liquids, starchy foods, low fat foods, etc.  If you feel full, bloated, or constipated, stay on a ful liquid or pureed/blenderized diet for a few days until you feel better and no longer constipated. Be sure to drink plenty of fluids every day to avoid getting dehydrated (feeling dizzy, not urinating, etc.). Gradually add a fiber supplement to your diet over the next week.  Gradually get back to a regular solid diet.  Avoid fast food or heavy meals the first week as you are more likely to get nauseated. It is expected for your digestive tract to need a few months to get back to normal.  It is common for your bowel movements and stools to be irregular.  You will have occasional bloating and cramping that should eventually fade away.  Until you are eating solid food normally, off all pain medications, and back to regular activities; your bowels will not be normal. Focus on eating a low-fat, high fiber diet the rest of your life  (See Getting to [place], below).  CARE of your INCISION or WOUND  It is good for closed incisions and even open wounds to be washed every day.  Shower every day.  Short baths are fine.  Wash the incisions and wounds clean with soap & water.    You may leave closed incisions open to air if it is dry.   You may cover the incision with clean gauze & replace it after your daily shower for comfort.  TEGADERM:  You have clear gauze band-aid dressings over your closed incision(s).  Remove the dressings 3 days after surgery.= 12/2 Saturday    If you have an open wound with a wound vac, see wound vac care instructions.    ACTIVITIES as tolerated Start light daily activities --- self-care, walking, climbing stairs-- beginning the day after surgery.  Gradually increase activities as tolerated.  Control your pain to be active.  Stop when you are tired.  Ideally, walk several times a day, eventually an hour a day.   Most people are back to most day-to-day activities in a few weeks.  It takes 4-8 weeks to get back to unrestricted, intense activity. If you can walk 30 minutes without difficulty, it is safe to try more intense activity such as jogging, treadmill, bicycling, low-impact aerobics, swimming, etc. Save the most intensive and strenuous activity for last (Usually 4-8 weeks after surgery) such as sit-ups, heavy lifting, contact sports, etc.  Refrain from any intense heavy lifting or straining until you are off narcotics for pain control.  You will have off  days, but things should improve week-by-week. DO NOT PUSH THROUGH PAIN.  Let pain be your guide: If it hurts to do something, don't do it.  Pain is your body warning you to avoid that activity for another week until the pain goes down. You may drive when you are no longer taking narcotic prescription pain medication, you can comfortably wear a seatbelt, and you can safely make sudden turns/stops to protect yourself without hesitating due to  pain. You may have sexual intercourse when it is comfortable. If it hurts to do something, stop.  MEDICATIONS Take your usually prescribed home medications unless otherwise directed.   Blood thinners:  Usually you can restart any strong blood thinners after the second postoperative day.  It is OK to take aspirin right away.     If you are on strong blood thinners (warfarin/Coumadin, Plavix, Xerelto, Eliquis, Pradaxa, etc), discuss with your surgeon, medicine PCP, and/or cardiologist for instructions on when to restart the blood thinner & if blood monitoring is needed (PT/INR blood check, etc).     PAIN CONTROL Pain after surgery or related to activity is often due to strain/injury to muscle, tendon, nerves and/or incisions.  This pain is usually short-term and will improve in a few months.  To help speed the process of healing and to get back to regular activity more quickly, DO THE FOLLOWING THINGS TOGETHER: Increase activity gradually.  DO NOT PUSH THROUGH PAIN Use Ice and/or Heat Try Gentle Massage and/or Stretching Take over the counter pain medication Take Narcotic prescription pain medication for more severe pain  Good pain control = faster recovery.  It is better to take more medicine to be more active than to stay in bed all day to avoid medications.  Increase activity gradually Avoid heavy lifting at first, then increase to lifting as tolerated over the next 6 weeks. Do not "push through" the pain.  Listen to your body and avoid positions and maneuvers than reproduce the pain.  Wait a few days before trying something more intense Walking an hour a day is encouraged to help your body recover faster and more safely.  Start slowly and stop when getting sore.  If you can walk 30 minutes without stopping or pain, you can try more intense activity (running, jogging, aerobics, cycling, swimming, treadmill, sex, sports, weightlifting, etc.) Remember: If it hurts to do it, then don't do  it! Use Ice and/or Heat You will have swelling and bruising around the incisions.  This will take several weeks to resolve. Ice packs or heating pads (6-8 times a day, 30-60 minutes at a time) will help sooth soreness & bruising. Some people prefer to use ice alone, heat alone, or alternate between ice & heat.  Experiment and see what works best for you.  Consider trying ice for the first few days to help decrease swelling and bruising; then, switch to heat to help relax sore spots and speed recovery. Shower every day.  Short baths are fine.  It feels good!  Keep the incisions and wounds clean with soap & water.   Try Gentle Massage and/or Stretching Massage at the area of pain many times a day Stop if you feel pain - do not overdo it Take over the counter pain medication This helps the muscle and nerve tissues become less irritable and calm down faster Choose ONE of the following over-the-counter anti-inflammatory medications: Acetaminophen 585m tabs (Tylenol) 1-2 pills with every meal and just before bedtime (avoid if you have liver  problems or if you have acetaminophen in you narcotic prescription) Naproxen 217m tabs (ex. Aleve, Naprosyn) 1-2 pills twice a day (avoid if you have kidney, stomach, IBD, or bleeding problems) Ibuprofen 2093mtabs (ex. Advil, Motrin) 3-4 pills with every meal and just before bedtime (avoid if you have kidney, stomach, IBD, or bleeding problems) Take with food/snack several times a day as directed for at least 2 weeks to help keep pain / soreness down & more manageable. Take Narcotic prescription pain medication for more severe pain A prescription for strong pain control is often given to you upon discharge (for example: oxycodone/Percocet, hydrocodone/Norco/Vicodin, or tramadol/Ultram) Take your pain medication as prescribed. Be mindful that most narcotic prescriptions contain Tylenol (acetaminophen) as well - avoid taking too much Tylenol. If you are having  problems/concerns with the prescription medicine (does not control pain, nausea, vomiting, rash, itching, etc.), please call [phone] see if we need to switch you to a different pain medicine that will work better for you and/or control your side effects better. If you need a refill on your pain medication, you must call the office before 4 pm and on weekdays only.  By federal law, prescriptions for narcotics cannot be called into a pharmacy.  They must be filled out on paper & picked up from our office by the patient or authorized caretaker.  Prescriptions cannot be filled after 4 pm nor on weekends.    WHEN TO CALL [phone] uncontrolled or worsening pain  Fever over 101 F (38.5 C) Concerns with the incision: Worsening pain, redness, rash/hives, swelling, bleeding, or drainage Reactions / problems with new medications (itching, rash, hives, nausea, etc.) Nausea and/or vomiting Difficulty urinating Difficulty breathing Worsening fatigue, dizziness, lightheadedness, blurred vision Other concerns If you are not getting better after two weeks or are noticing you are getting worse, contact our office [phone] for further advice.  We may need to adjust your medications, re-evaluate you in the office, send you to the emergency room, or see what other things we can do to help. The clinic staff is available to answer your questions during regular business hours (8:30am-5pm).  Please don't hesitate to call and ask to speak to one of our nurses for clinical concerns.    A [facility] is always on call at the hospitals 24 hours/day If you have a medical emergency, go to the nearest emergency room or call 911.  FOLLOW UP in our office One the day of your discharge from the hospital (or the next business weekday), please call [facility] to set up or confirm an appointment to see your surgeon in the office for a follow-up appointment.   Usually it is 2-3 weeks after your surgery.   If you have skin staples at your incision(s), let the office know so we can set up a time in the office for the nurse to remove them (usually around 10 days after surgery). Make sure that you call for appointments the day of discharge (or the next business weekday) from the hospital to ensure a convenient appointment time. IF YOU HAVE DISABILITY OR FAMILY LEAVE FORMS, BRING THEM TO THE OFFICE FOR PROCESSING.  DO NOT GIVE THEM TO YOUR DOCTOR.  [facility] [address] [address]  [id number] ([phone] Main [phone] [place] ([phone] Fax [email or web address]    GETTING TO GOOD BOWEL HEALTH. It is expected  for your digestive tract to need a few months to get back to normal.  It is common for your bowel movements and stools to be irregular.  You will have occasional bloating and cramping that should eventually fade away.  Until you are eating solid food normally, off all pain medications, and back to regular activities; your bowels will not be normal.   Avoiding constipation The goal: ONE SOFT BOWEL MOVEMENT A DAY!    Drink plenty of fluids.  Choose water first. TAKE A FIBER SUPPLEMENT EVERY DAY THE REST OF YOUR LIFE During your first week back home, gradually add back a fiber supplement every day Experiment which form you can tolerate.   There are many forms such as powders, tablets, wafers, gummies, etc Psyllium bran (Metamucil), methylcellulose (Citrucel), Miralax or Glycolax, Benefiber, Flax Seed.  Adjust the dose week-by-week (1/2 dose/day to 6 doses a day) until you are moving your bowels 1-2 times a day.  Cut back the dose or try a different fiber product if it is giving you problems such as diarrhea or bloating. Sometimes a laxative is needed to help jump-start bowels if constipated until the fiber supplement can help regulate your bowels.  If you are tolerating eating  & you are farting, it is okay to try a gentle laxative such as double dose MiraLax, prune juice, or Milk of Magnesia.  Avoid using laxatives too often. Stool softeners can sometimes help counteract the constipating effects of narcotic pain medicines.  It can also cause diarrhea, so avoid using for too long. If you are still constipated despite taking fiber daily, eating solids, and a few doses of laxatives, call our office. Controlling diarrhea Try drinking liquids and eating bland foods for a few days to avoid stressing your intestines further. Avoid dairy products (especially milk & ice cream) for a short time.  The intestines often can lose the ability to digest lactose when stressed. Avoid foods that cause gassiness or bloating.  Typical foods include beans and other legumes, cabbage, broccoli, and dairy foods.  Avoid greasy, spicy, fast foods.  Every person has some sensitivity to other foods, so listen to your body and avoid those foods that trigger problems for you. Probiotics (such as active yogurt, Align, etc) may help repopulate the intestines and colon with normal bacteria and calm down a sensitive digestive tract Adding a fiber supplement gradually can help thicken stools by absorbing excess fluid and retrain the intestines to act more normally.  Slowly increase the dose over a few weeks.  Too much fiber too soon can backfire and cause cramping & bloating. It is okay to try and slow down diarrhea with a few doses of antidiarrheal medicines.   Bismuth subsalicylate (ex. Kayopectate, Pepto Bismol) for a few doses can help control diarrhea.  Avoid if pregnant.   Loperamide (Imodium) can slow down diarrhea.  Start with one tablet (6m) first.  Avoid if you are having fevers or severe pain.  ILEOSTOMY PATIENTS WILL HAVE CHRONIC DIARRHEA since their colon is not in use.    Drink plenty of liquids.  You will need to drink even more glasses of water/liquid a day to avoid getting dehydrated. Record  output from your ileostomy.  Expect to empty the bag every 3-4 hours at first.  Most people with a permanent ileostomy empty their bag 4-6 times at the least.   Use antidiarrheal medicine (especially Imodium) several times a day to avoid getting dehydrated.  Start with a dose at bedtime &  breakfast.  Adjust up or down as needed.  Increase antidiarrheal medications as directed to avoid emptying the bag more than 8 times a day (every 3 hours). Work with your wound ostomy nurse to learn care for your ostomy.  See ostomy care instructions. TROUBLESHOOTING IRREGULAR BOWELS 1) Start with a soft & bland diet. No spicy, greasy, or fried foods.  2) Avoid gluten/wheat or dairy products from diet to see if symptoms improve. 3) Miralax 17gm or flax seed mixed in [place]. water or juice-daily. May use 2-4 times a day as needed. 4) Gas-X, Phazyme, etc. as needed for gas & bloating.  5) Prilosec (omeprazole) over-the-counter as needed 6)  Consider probiotics (Align, Activa, etc) to help calm the bowels down  Call your doctor if you are getting worse or not getting better.  Sometimes further testing (cultures, endoscopy, X-ray studies, CT scans, bloodwork, etc.) may be needed to help diagnose and treat the cause of the diarrhea. [facility], [place], [place], [place], [place]  [id number] [phone] - Main.    [phone]  - Toll Free.   [phone] - Fax [email or web address]

## 2022-09-24 NOTE — H&P (Signed)
09/24/2022   PROVIDER: Hollace Kinnier, MD  Patient Care Team: Vikki Ports, MD as PCP - General (Family Medicine) Johney Maine, Adrian Saran, MD as Consulting Provider (General Surgery)  [facility] MRN: [id number] DOB: 1956-01-18 DATE OF ENCOUNTER: 09/24/2022  Interval History:   The patient returns to the office after hospitalization.  66 year old male. Had some lower abdominal pain lasting for over 10 days.Marland Kitchen CAT scan showed inflammation and probable perforated appendicitis with contained abscess. He was admitted and placed on IV antibiotics. Stabilized. Had some persistent symptoms. Repeat CT scan showed more distinct abscess. IR did aspiration. Switched antibiotics. He improved. Sent home on Augmentin. Cultures grew multiple organisms. Follow-up with his primary care physician. Felt worsening symptoms so was placed back on antibiotics. Primary care followed him closely. Last visit 9/6 he was feeling much better. Patient is followed by [facility] gastroenterology. Dr. Michail Sermon did colonoscopy in 2019. Looks like he had a hyperplastic polyp only. Most likely 10-year follow-up recommended.  Patient feels much better overall. Moving his bowels every day. No fevers or chills. No nausea. Started to exercise. Not at his full regimen through [facility], but on his way.  Ready for surgery after his trips in October     Labs, Imaging and Diagnostic Testing:  Located in [place]' section of Epic EMR chart  PRIOR [facility] CLINIC NOTES:  Not applicable  SURGERY NOTES:  Located in [place]' section of Epic EMR chart  PATHOLOGY:  Not applicable  Physical Examination:   There is no height or weight on file to calculate BMI.  Constitutional: Not cachectic. Hygeine adequate.  Eyes: Wears glasses. Vision corrected. Normal extraocular movements. Sclera nonicteric Neuro: No major focal sensory defects. No major motor deficits. Psych: No severe agitation. No severe anxiety. Judgment &  insight Adequate, Oriented x4, HENT: Normocephalic, Mucus membranes moist. No thrush.  Neck: Supple, No tracheal deviation.  Chest: Good respiratory excursion. No audible wheezing CV: No major extremity edema Ext: No obvious deformity or contracture. Edema: not present. No cyanosis Skin: Warm and dry Musculoskeletal: Severe joint rigidity not present. Mobility: no assist device moving easily without restrictions  Abdomen: Flat Hernia: Not present. Incisions Clean & dry with normal healing ridge Nontender. Diastasis recti: Not present. Soft. Nondistended.   Gen: Inguinal hernia: Not present. Inguinal lymph nodes: without lymphadenopathy.   Rectal: (Deferred)    Assessment and Plan:   Jeremy Bush is a 66 y.o. male with probable perforated appendicitis and abscess now improved after hospitalization with IV antibiotics and aspiration and prolonged oral antibiotics..  Diagnoses and all orders for this visit:  Appendicitis with abscess  History of colonic polyps  Other orders - polyethylene glycol (MIRALAX) powder; Take 233.75 g by mouth once for 1 dose Take according to your procedure prep instructions. - bisacodyL (DULCOLAX) 5 mg EC tablet; Take 4 tablets (20 mg total) by mouth once daily as needed for Constipation for up to 1 dose - metroNIDAZOLE (FLAGYL) 500 MG tablet; Take 2 tablets (1,000 mg total) by mouth 3 (three) times daily for 3 doses SEE BOWEL PREP INSTRUCTIONS: Take 2 tablets at 2pm, 3pm, and 10pm the day prior to your colon operation. - neomycin 500 mg tablet; Take 2 tablets (1,000 mg total) by mouth 3 (three) times daily for 3 doses SEE BOWEL PREP INSTRUCTIONS: Take 2 tablets at 2pm, 3pm, and 10pm the day prior to your colon operation. - amoxicillin-clavulanate (AUGMENTIN) 875-125 mg tablet; Take 1 tablet (875 mg total) by mouth every 12 (twelve) hours for  10 days    I think he would benefit from interval appendectomy at some point. Because we have allowed the  acute infection to get under control, more likely we can do an outpatient laparoscopic appendectomy. I did caution there is a chance he would need an ileocolectomy but hopefully is less likely now.  The anatomy & physiology of the digestive tract was discussed. The pathophysiology of appendicitis and other appendiceal disorders were discussed. Natural history risks without surgery was discussed. I feel the risks of no intervention will lead to serious problems that outweigh the operative risks; therefore, I recommended diagnostic laparoscopy with removal of appendix to remove the pathology. Laparoscopic & open techniques were discussed. I noted a good likelihood this will help address the problem.   Risks such as bleeding, infection, abscess, leak, reoperation, injury to other organs, need for repair of tissues / organs, possible ostomy, hernia, heart attack, stroke, death, and other risks were discussed. Goals of post-operative recovery were discussed as well. We will work to minimize complications. Questions were answered. The patient expresses understanding & wishes to proceed with surgery.  Usually standard of care is to consider colonoscopy to make sure there is no other etiology. There is concerns by radiology there might be a mass or tumor there. I think that is an over read. Patient's last colonoscopy was almost 5 years ago. Looks like they generally found a hyperplastic polyp. We recommended least discussing with gastrology to see what they think. He has an appointment but not until next month. I will try and reach out to Dr. Michail Sermon and see if he thinks it is reasonable to do colonoscopy and then appendectomy in the next day provided there are no surprises. Another option is to stick to the 10-year follow-up. Patient likes the idea of getting a colonoscopy to make sure nothing else is going on.   Adin Hector, MD, FACS, MASCRS Esophageal, Gastrointestinal & Colorectal Surgery Robotic and  Minimally Invasive Surgery  [facility] [place] [address] [place], [place] [address] [phone] Fax [phone] Main  CONTACT INFORMATION:  Weekday (9AM-5PM): Call [facility] main office at [phone]  Weeknight (5PM-9AM) or Weekend/Holiday: Check [email or web address] (password " [email or web address]") for General Surgery [facility] coverage  (Please, do not use SecureChat as it is not reliable communication to reach operating surgeons for immediate patient care given surgeries/outpatient duties/clinic/cross-coverage/off post-call which would lead to a delay in care.  Epic staff messaging available for outptient concerns, but may not be answered for 48 hours or more).    09/24/2022

## 2022-09-24 NOTE — Anesthesia Procedure Notes (Signed)
Procedure Name: Intubation Date/Time: 09/24/2022 1:33 PM  Performed by: Claudia Desanctis, CRNAPre-anesthesia Checklist: Patient identified, Emergency Drugs available, Suction available and Patient being monitored Patient Re-evaluated:Patient Re-evaluated prior to induction Oxygen Delivery Method: Circle system utilized Preoxygenation: Pre-oxygenation with 100% oxygen Induction Type: IV induction Ventilation: Mask ventilation without difficulty Laryngoscope Size: Miller and 3 Grade View: Grade I Tube type: Oral Tube size: 7.5 mm Number of attempts: 1 Airway Equipment and Method: Stylet Placement Confirmation: ETT inserted through vocal cords under direct vision, positive ETCO2 and breath sounds checked- equal and bilateral Secured at: 21 cm Tube secured with: Tape Dental Injury: Teeth and Oropharynx as per pre-operative assessment

## 2022-09-24 NOTE — Anesthesia Postprocedure Evaluation (Signed)
Anesthesia Post Note  Patient: Jeremy Bush  Procedure(s) Performed: APPENDECTOMY LAPAROSCOPIC (Abdomen) HERNIA REPAIR UMBILICAL ADULT     Patient location during evaluation: PACU Anesthesia Type: General Level of consciousness: awake Pain management: pain level controlled Vital Signs Assessment: post-procedure vital signs reviewed and stable Respiratory status: spontaneous breathing Cardiovascular status: stable Postop Assessment: no apparent nausea or vomiting Anesthetic complications: no   No notable events documented.  Last Vitals:  Vitals:   09/24/22 1545 09/24/22 1630  BP: (!) 163/94 (!) 139/92  Pulse: 74 76  Resp: 15 18  Temp: (!) 36.4 C 36.6 C  SpO2: 96% 98%    Last Pain:  Vitals:   09/24/22 1630  TempSrc: Oral  PainSc: 2                  Dionysios Massman

## 2022-09-24 NOTE — Interval H&P Note (Signed)
History and Physical Interval Note:  09/24/2022 12:36 PM  Jeremy Bush  has presented today for surgery, with the diagnosis of APPENDICITIS.  The various methods of treatment have been discussed with the patient and family. After consideration of risks, benefits and other options for treatment, the patient has consented to  Procedure(s): APPENDECTOMY LAPAROSCOPIC (N/A) POSSIBLE ILEOCECETOMY (N/A) as a surgical intervention.  The patient's history has been reviewed, patient examined, no change in status, stable for surgery.  I have reviewed the patient's chart and labs.  Questions were answered to the patient's satisfaction.    I have re-reviewed the the patient's records, history, medications, and allergies.  I have re-examined the patient.  I again discussed intraoperative plans and goals of post-operative recovery.  The patient agrees to proceed.  Jeremy Bush  [birth year] [id number]  Patient Care Team: Rita Ohara, MD as PCP - General (Family Medicine) Wilford Corner, MD as Consulting Physician (Gastroenterology)  Patient Active Problem List   Diagnosis Date Noted   Appendicitis with abscess 06/18/2022   Acute perforated appendicitis 06/18/2022   Gout 11/01/2015   Anxiety state 04/18/2015   GERD (gastroesophageal reflux disease) 11/09/2013   IFG (impaired fasting glucose) 07/28/2013   Colonic polyp    Nocturia    Hypertension    Hyperlipidemia    Elevated PSA 01/19/2012   Essential hypertension, benign 05/28/2011   Pure hypercholesterolemia 03/05/2011   Adenomatous colon polyp 03/05/2011    Past Medical History:  Diagnosis Date   Anxiety    related to work stress   Colonic polyp 2007, 2013   Elevated uric acid in blood    with possible gout flare x 2   Hyperlipidemia    Hypertension    Nocturia    Pre-diabetes    Prostate cancer ([place]) 03/24/2012   gleason 6, vol 31.3 cc    Past Surgical History:  Procedure Laterality Date   BIOPSY PROSTATE   03/24/12   gleason 3+3=6/prostate volume 31 cc's  (md office)   CYSTOSCOPY  08/13/2012   Procedure: CYSTOSCOPY FLEXIBLE;  Surgeon: Claybon Jabs, MD;  Location[facility];  Service: Urology;  Laterality: N/A;  no seeds found in bladder   RADIOACTIVE SEED IMPLANT  08/13/2012   Procedure: RADIOACTIVE SEED IMPLANT;  Surgeon: Claybon Jabs, MD;  Location: [facility];  Service: Urology;  Laterality: N/A;   72    seeds implanted    Social History   Socioeconomic History   Marital status: Married    Spouse name: Not on file   Number of children: Not on file   Years of education: Not on file   Highest education level: Not on file  Occupational History   Not on file  Tobacco Use   Smoking status: Never   Smokeless tobacco: Never  Vaping Use   Vaping Use: Never used  Substance and Sexual Activity   Alcohol use: Yes    Alcohol/week: 14.0 standard drinks of alcohol    Types: 14 Standard drinks or equivalent per week    Comment: 2 drinks/day (or more in weekends if less during week)   Drug use: No   Sexual activity: Yes    Partners: Female  Other Topics Concern   Not on file  Social History Narrative   Lives with wife, 1 dog.     2 children and 1 stepchild.  2 daughters in [place] ([place]), stepson in [place]. 2 granddaughters.   1 daughter got married, other is getting  married this year.   Retired.   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      Updated 12/2020   Social Determinants of Health   Financial Resource Strain: Not on file  Food Insecurity: Not on file  Transportation Needs: Not on file  Physical Activity: Not on file  Stress: Not on file  Social Connections: Not on file  Intimate Partner Violence: Not on file    Family History  Problem Relation Age of Onset   Cancer Mother        lung (smoker)   Hypertension Mother    Hyperlipidemia Mother    Stroke Father        mini-stroke   Hypertension  Father    Kidney disease Father        on dialysis   Diabetes Father    Healthy Sister     Medications Prior to Admission  Medication Sig Dispense Refill Last Dose   allopurinol (ZYLOPRIM) 100 MG tablet TAKE 1 TABLET BY MOUTH DAILY 90 tablet 3 09/24/2022 at 0600   ALPRAZolam (XANAX) 0.5 MG tablet TAKE ONE-HALF TO ONE (0.5-1) TABLET BY MOUTH THREE TIMES A DAY AS NEEDED FOR ANXIETY (Patient taking differently: Take 0.25-0.5 mg by mouth daily as needed for anxiety.) 30 tablet 1 09/24/2022 at 0600   amoxicillin-clavulanate (AUGMENTIN) 875-125 MG tablet Take 1 tablet by mouth 2 (two) times daily as needed (symptoms).      atenolol (TENORMIN) 25 MG tablet TAKE 1 TABLET BY MOUTH DAILY 90 tablet 3 09/24/2022 at 0600   atorvastatin (LIPITOR) 40 MG tablet TAKE 1 TABLET BY MOUTH DAILY 90 tablet 3 09/23/2022   lisinopril (ZESTRIL) 20 MG tablet TAKE 1 TABLET BY MOUTH DAILY 90 tablet 3 09/23/2022   omeprazole (PRILOSEC) 20 MG capsule Take 20 mg by mouth daily as needed (indigestion).   09/24/2022 at 0600   bisacodyl (DULCOLAX) 5 MG EC tablet Take 5 mg by mouth daily as needed for moderate constipation.      metroNIDAZOLE (FLAGYL) 500 MG tablet Take 1,000 mg by mouth 3 (three) times daily. Per Bowel prep      neomycin (MYCIFRADIN) 500 MG tablet Take 500 mg by mouth See admin instructions. Bowel prep      polyethylene glycol powder (GLYCOLAX/MIRALAX) 17 GM/SCOOP powder Take 233.75 g by mouth once.       Current Facility-Administered Medications  Medication Dose Route Frequency Provider Last Rate Last Admin   bisacodyl (DULCOLAX) EC tablet 20 mg  20 mg Oral Once Michael Boston, MD       bupivacaine liposome (EXPAREL) 1.3 % injection 266 mg  20 mL Infiltration Once Michael Boston, MD       cefoTEtan (CEFOTAN) 2 g in sodium chloride 0.9 % 100 mL IVPB  2 g Intravenous On Call to OR Michael Boston, MD       Derrill Memo ON 09/25/2022] feeding supplement (ENSURE PRE-SURGERY) liquid 296 mL  296 mL Oral Once Michael Boston, MD       feeding supplement (ENSURE PRE-SURGERY) liquid 592 mL  592 mL Oral Once Michael Boston, MD       lactated ringers infusion   Intravenous Continuous Oleta Mouse, MD 10 mL/hr at 09/24/22 1141 New Bag at 09/24/22 1141     Allergies  Allergen Reactions   Sulfa Antibiotics Rash    BP (!) 145/94   Pulse 70   Temp 97.9 F (36.6 C) (Oral)   Resp 18  Ht '5\' 11"'$  (1.803 m)   Wt 81.6 kg   SpO2 99%   BMI 25.10 kg/m   Labs: Results for orders placed or performed during the hospital encounter of 09/24/22 (from the past 48 hour(s))  ABO/Rh     Status: None (Preliminary result)   Collection Time: 09/24/22 11:26 AM  Result Value Ref Range   ABO/RH(D) PENDING     Imaging / Studies: No results found.   Adin Hector, M.D., F.A.C.S. Gastrointestinal and Minimally Invasive Surgery [facility] [address] [place], [place] [address] [phone] Main / Paging  09/24/2022 12:36 PM    Adin Hector

## 2022-09-24 NOTE — Op Note (Signed)
PATIENT:  Jeremy Bush  66 y.o. male  Patient Care Team: Rita Ohara, MD as PCP - General (Family Medicine) Wilford Corner, MD as Consulting Physician (Gastroenterology) Michael Boston, MD as Consulting Physician (General Surgery)  PRE-OPERATIVE DIAGNOSIS:  HISTORY OF APPENDICITIS  POST-OPERATIVE DIAGNOSIS:   CHRONIC APPENDICITIS PROBABLE MUCOCELE / LAMN   PROCEDURE:   LAPAROSCOPIC APPENDECTOMY HERNIA REPAIR UMBILICAL - PRIMARY SUTURE   SURGEON:  Adin Hector, MD  ANESTHESIA:    local and general  Regional TRANSVERSUS ABDOMINIS PLANE (TAP) nerve block for perioperative & postoperative pain control provided with liposomal bupivacaine (Experel) mixed with 0.25% bupivacaine as a Bilateral TAP block x 75m each side at the level of the transverse abdominis & preperitoneal spaces along the flank at the anterior axillary line, from subcostal ridge to iliac crest under laparoscopic guidance    EBL:  Total I/O In: 1100 [I.V.:1000; IV Piggyback:100] Out: 110 [Urine:100; Blood:10]  Delay start of Pharmacological VTE agent (>24hrs) due to surgical blood loss or risk of bleeding:  no  DRAINS: none   SPECIMEN:   APPENDIX SIDE WALL MARGIN  DISPOSITION OF SPECIMEN:  PATHOLOGY  COUNTS:  YES  PLAN OF CARE: Discharge to home after PACU  PATIENT DISPOSITION:  PACU - hemodynamically stable.  INDICATIONS: Patient with concerning symptoms & work up suspicious for appendicitis.  Required hospitalization with IV antibiotics and aspiration.  Stabilized.  Had underwhelming colonoscopy less than 5 years ago.  Recommendation for interval appendectomy surgery was made:  The anatomy & physiology of the digestive tract was discussed.  The pathophysiology of appendicitis was discussed.  Natural history risks without surgery was discussed.   I feel the risks of no intervention will lead to serious problems that outweigh the operative risks; therefore, I recommended diagnostic  laparoscopy with removal of appendix to remove the pathology.  Laparoscopic & open techniques were discussed.   I noted a good likelihood this will help address the problem.    Risks such as bleeding, infection, abscess, leak, reoperation, possible ostomy, hernia, heart attack, death, and other risks were discussed.  Goals of post-operative recovery were discussed as well.  We will work to minimize complications.  Questions were answered.  The patient expresses understanding & wishes to proceed with surgery.  OR FINDINGS: Inflamed thickened appendix no other current consistent chronic appendicitis.  Central spherical fullness.  Opened up and mucin noted within it.  No obvious polyp or tumor.  CASE DATA:  Type of patient?: Elective [facility] Private Case  Status of Case? Elective Scheduled  Infection Present At Time Of Surgery (PATOS)?  PHLEGMON  DESCRIPTION:   The patient was identified & brought into the operating room. The patient was positioned supine with arms tucked. SCDs were active during the entire case. The patient underwent general anesthesia without any difficulty.  The abdomen was prepped and draped in a sterile fashion. A Surgical Timeout confirmed our plan.  We made a transverse incision through the superior umbilical fold.  I made a small transverse nick through the infraumbilical fascia and confirmed peritoneal entry.  We placed a 549mport.  We induced carbon dioxide insufflation.  Camera inspection revealed no injury.  I placed additional ports under direct laparoscopic visualization.  Upon entering the abdomen (organ space), I encountered a phlegmon involving the appendix .  We mobilized the terminal ileum to proximal ascending colon in a lateral to medial fashion.   Next was stuck to the right lower quadrant anterior abdominal wall just above the inguinal ligament.  Freddrick March it off.  I took care to avoid injuring any retroperitoneal structures.  We freed the appendix off its attachments  to the ascending colon and cecal mesentery.  I elevated the appendix. I skeletonized the mesoappendix. I was able to free off the base of the appendix which was still viable.  I stapled the appendix off the cecum using a laparoscopic stapler. I took a healthy cuff of viable cecum. I ligated the mesoappendix and assured hemostasis in the mesentery.  We placed the appendix inside an EcoSac bag and removed out the 52m stapler port.  We opened the bag on the back table and slightly appendix.  Encountered a pocket of mucin within the mid appendix suspicious for LAMN.  No obvious nodularity / tumor.  No obvious extracellular mucin or nodularity noted.  Switch to sterile gloves.  I excised the sidewall where the appendix had been stuck to it for a negative margin just in case. Looked healthy.  We did copious irrigation of several liters of saline and serial aliquots. Hemostasis was good in the mesoappendix, colon mesentery, and retroperitoneum. Staple line was intact on the cecum with no bleeding. I washed out the pelvis, retrohepatic space and right paracolic gutter. I washed out the left side as well.  Hemostasis is good. There was no perforation or injury.  Because the area cleaned up well after irrigation, I did not place a drain.  I closed the 12 mm stapler port site fascia with #1 PDS suture using a suture passer under direct laparoscopic visualization.   I aspirated the carbon dioxide. Ports removed.  We freed the umbilical stalk off the fascia to expose the 8 mm hernia.  We primarily closed using interrupted #1 PDS and read tacked umbilical stalk using 0 Vicryl.  We closed skin using 4-0 monocryl stitch.  Sterile dressings applied.  Patient was extubated and sent to the recovery room. I discussed operative findings, updated the patient's status, discussed probable steps to recovery, and gave postoperative recommendations to the patient's spouse, SVinnie Level   .  Recommendations were made.  Questions were  answered.  She expressed understanding & appreciation.  eRx has been sent and instructions are written.  SAdin Hector M.D., F.A.C.S. Gastrointestinal and Minimally Invasive Surgery [facility] [address] [place] [phone] / Paging  09/24/2022 3:04 PM

## 2022-09-24 NOTE — Transfer of Care (Signed)
Immediate Anesthesia Transfer of Care Note  Patient: Jeremy Bush  Procedure(s) Performed: APPENDECTOMY LAPAROSCOPIC (Abdomen) HERNIA REPAIR UMBILICAL ADULT  Patient Location: PACU  Anesthesia Type:General  Level of Consciousness: awake, alert , and oriented  Airway & Oxygen Therapy: Patient Spontanous Breathing and Patient connected to face mask oxygen  Post-op Assessment: Report given to RN, Post -op Vital signs reviewed and stable, and Patient moving all extremities X 4  Post vital signs: Reviewed and stable  Last Vitals:  Vitals Value Taken Time  BP 168/102   Temp    Pulse 82 09/24/22 1511  Resp 19 09/24/22 1511  SpO2 100 % 09/24/22 1511  Vitals shown include unvalidated device data.  Last Pain:  Vitals:   09/24/22 1130  TempSrc: Oral  PainSc: 0-No pain      Patients Stated Pain Goal: 4 (94/07/[birth year] 0881)  Complications: No notable events documented.

## 2022-09-25 ENCOUNTER — Encounter (HOSPITAL_COMMUNITY): Payer: Self-pay | Admitting: Surgery

## 2022-09-29 ENCOUNTER — Encounter: Payer: Self-pay | Admitting: Surgery

## 2022-09-29 LAB — SURGICAL PATHOLOGY

## 2022-10-06 ENCOUNTER — Telehealth: Payer: Self-pay | Admitting: Family Medicine

## 2022-10-06 DIAGNOSIS — F411 Generalized anxiety disorder: Secondary | ICD-10-CM

## 2022-10-06 NOTE — Telephone Encounter (Signed)
Fax refill request  Alprazolam  .5 mg   Last written #30  dispensed 07/17/22

## 2022-10-07 MED ORDER — ALPRAZOLAM 0.5 MG PO TABS: 0.2500 mg | ORAL_TABLET | Freq: Every day | ORAL | 1 refills | Status: DC | PRN

## 2023-04-01 ENCOUNTER — Other Ambulatory Visit: Payer: Self-pay | Admitting: Family Medicine

## 2023-04-01 DIAGNOSIS — F411 Generalized anxiety disorder: Secondary | ICD-10-CM

## 2023-04-01 NOTE — Telephone Encounter (Signed)
Is this okay to refill? 

## 2023-07-20 ENCOUNTER — Other Ambulatory Visit: Payer: Self-pay | Admitting: Family Medicine

## 2023-07-20 DIAGNOSIS — F411 Generalized anxiety disorder: Secondary | ICD-10-CM

## 2023-07-20 DIAGNOSIS — E7800 Pure hypercholesterolemia, unspecified: Secondary | ICD-10-CM

## 2023-07-20 NOTE — Telephone Encounter (Signed)
Is this okay to refill? 

## 2023-08-12 ENCOUNTER — Ambulatory Visit: Payer: Medicare Other

## 2023-08-17 ENCOUNTER — Other Ambulatory Visit: Payer: Self-pay | Admitting: Family Medicine

## 2023-08-17 DIAGNOSIS — M109 Gout, unspecified: Secondary | ICD-10-CM

## 2023-08-17 DIAGNOSIS — I10 Essential (primary) hypertension: Secondary | ICD-10-CM

## 2023-08-17 DIAGNOSIS — F418 Other specified anxiety disorders: Secondary | ICD-10-CM

## 2023-10-11 NOTE — Progress Notes (Unsigned)
No chief complaint on file.  Jeremy Bush is a 67 y.o. male who presents for Medicare Annual Wellness visit and follow-up on chronic medical conditions.    He had appendicitis with rupture abscess in 05/2022. He subsequently underwent appendectomy and umbilical hernia repair in late 08/2022.  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  F/u colonoscopy in 1 year with Dr. Bosie Clos was recommended.  He had prostatitis in 10/2021. Denies any further urinary complaints.  Hypertension: He is compliant with 25mg  of atenolol, and 20mg  of lisinopril.  Denies side effects to medications. BP's are running  BP Readings from Last 3 Encounters:  09/24/22 (!) 139/92  09/11/22 138/88  07/16/22 120/70   He denies headaches, dizziness, chest pain, cough.  Follows low sodium diet. He is getting regular exercise.   Impaired fasting glucose--he tries to follow a healthy diet, limiting carbs, sweets, sugar and getting regular exercise. Avoiding carbs at home, some when out, but is "conscious of it".  Occasional pasta when out. Very rare desserts. Last A1c was 5.8% in 06/2022. Alcohol ??     Hyperlipidemia follow-up:  Patient is reportedly following a low-fat, low cholesterol diet.  Compliant with medications (40mg  atorvastatin) and denies medication side effects.   Prior to last lipid check he had been eating red meat more frequently (elk), steak infrequently. Typically he eats red meat 2-3x/month, mainly eats egg whites, rare yolk. No butter; occ 2% cheese in an omelette. Pt has aortic atherosclerosis, noted on CT scan in 05/2022. He is due for recheck  Current diet ***  Lab Results  Component Value Date   CHOL 227 (H) 07/16/2022   HDL 77 07/16/2022   LDLCALC 135 (H) 07/16/2022   TRIG 88 07/16/2022   CHOLHDL 2.9 07/16/2022   Gout--He denies any gout flares in a few years. Prior gout was foot/ankle/toes.  Compliant with 100mg  of allopurinol  without side effects. Lab Results  Component Value Date   LABURIC 6.0 07/16/2022     Anxiety:  He uses the alprazolam as needed, when his mind is worrying, thinking too much about things, and also uses with flying.  Takes 1/2 tablet at a time. Sometimes will go a week without any.   He gets #30 filled about every 3 months, last fill was 07/20/23.   Prostate cancer--Previously monitored by Dr. Vernie Ammons.  S/p radioactive seed implant, and has done very well.  He was released to PCP to follow PSA's, and to see him urologist only. Last PSA was undetectable in 06/2022, due for recheck.  GERD--Reflux used to be triggered by red meat and red wine, and he used to take Prilosec prior to eating that.  Currently uses Prilosec *** UPDATE     Immunization History  Administered Date(s) Administered   Fluad Quad(high Dose 65+) 07/02/2022   Influenza Split 07/22/2011, 07/29/2012   Influenza, High Dose Seasonal PF 08/01/2020   Influenza,inj,Quad PF,6+ Mos 07/28/2013, 08/02/2014, 11/01/2015, 06/25/2016, 09/15/2017, 08/04/2018   Influenza-Unspecified 09/14/2019, 10/07/2021   PFIZER(Purple Top)SARS-COV-2 Vaccination 12/01/2019, 12/22/2019, 08/01/2020   Pfizer Covid-19 Vaccine Bivalent Booster 41yrs & up 10/07/2021   Pneumococcal Conjugate-13 04/07/2019   Pneumococcal Polysaccharide-23 04/23/2020   Td 09/15/2005   Tdap 01/13/2013   Zoster, Live 08/02/2014   Last colonoscopy: 10/2017 hyperplastic polyp and hemorrhoids.  (h/o adenomatous polyps).  Dr. Bosie Clos  Last PSA:  Lab Results  Component Value Date   PSA1 <0.1 07/16/2022   PSA1 <0.1 01/14/2021   PSA 4.52 (H) 01/12/2012  PSA 3.66 07/11/2011   PSA 3.51 05/21/2011   Dentist: 4 times yearly (for cleanings) Ophtho: yearly Exercise:   Works out at [employer] 3x/week--1/3 [employer], rowing, treadmill (power walk with elevation), along with strengthwork (lunges, medicine ball). Walking an hour daily (with dog, frequent stops sometimes).   Pickleball.  Patient Care Team: Joselyn Arrow, MD as PCP - General (Family Medicine) Charlott Rakes, MD as Consulting Physician (Gastroenterology) Karie Soda, MD as Consulting Physician (General Surgery) Adalberto Ill, DMD (Dentistry) Urologist: Dr. Vernie Ammons (retired) Dentist: Dr. Regan Rakers Ophtho: [place] [facility], Dr. Cathey Endow   Depression Screening: Flowsheet Row Office Visit from 07/16/2022 in [facility]  PHQ-2 Total Score 0        Falls screen:     07/16/2022    8:38 AM 06/12/2022    1:20 PM 11/06/2021   11:22 AM 01/16/2021    2:48 PM 04/07/2019    9:55 AM  Fall Risk   Falls in the past year? 1 1 0 0 0  Number falls in past yr: 0 0 0    Comment 06/09/22 06/09/22     Injury with Fall? 1 0 0    Comment tripped while hiking and caught his fall with left hand, laceration to left hand. tripped while hiking and caught his fall with left hand, laceration to left hand.     Risk for fall due to : History of fall(s) History of fall(s) No Fall Risks    Follow up Falls evaluation completed Falls evaluation completed Falls evaluation completed       Functional Status Survey:         End of Life Discussion:  Patient does not have a living will and medical power of attorney. Paperwork given in the past (twice). Still has the forms, still on "to do" list.  *** UPDATE  PMH, PSH, SH and FH were reviewed and updated.    ROS: The patient denies anorexia, fever, headaches, vision loss, decreased hearing, ear pain, hoarseness, chest pain, palpitations, dizziness, syncope, dyspnea on exertion, cough, swelling, nausea, vomiting, diarrhea, constipation, abdominal pain, melena, hematochezia, hematuria, incontinence, erectile dysfunction, nocturia, weakened urine stream, dysuria, genital lesions, joint pains, numbness, tingling, weakness, tremor, suspicious skin lesions, depression, abnormal bleeding/bruising, or enlarged lymph nodes.  Rare anxiety (per HPI).   PHYSICAL EXAM:  There  were no vitals taken for this visit.  Wt Readings from Last 3 Encounters:  09/24/22 180 lb (81.6 kg)  09/11/22 180 lb (81.6 kg)  07/16/22 171 lb 3.2 oz (77.7 kg)    General Appearance:   Alert, cooperative, no distress, appears stated age    Head:   Normocephalic, without obvious abnormality, atraumatic    Eyes:   PERRL, conjunctiva/corneas clear, EOM's intact, fundi benign    Ears:   Normal TM's and external ear canals    Nose:   Normal no drainage or sinus tenderness  Throat:   Normal mucosa, no lesions  Neck:   Supple, no lymphadenopathy; thyroid: no enlargement/ tenderness/nodules; no carotid bruit or JVD    Back:   Spine nontender, no curvature, ROM normal, no CVA tenderness    Lungs:   Clear to auscultation bilaterally without wheezes, rales or ronchi; respirations unlabored    Chest Wall:   No tenderness or deformity    Heart:   Regular rate and rhythm, S1 and S2 normal, no murmur, rub or gallop    Breast Exam:   No chest wall tenderness, masses or gynecomastia    Abdomen:  Soft, non-tender, nondistended, normoactive bowel sounds, no masses, no hepatosplenomegaly    Genitalia:   Normal, no lesions. Testicles descended, normal, no masses. No inguinal hernias  Rectal:   Normal sphincter tone, no masses.  Prostate is smooth, not enlarged, no nodules.  Heme negative stool  Extremities:   No clubbing, cyanosis or edema    Pulses:   2+ and symmetric all extremities    Skin:   Skin color, texture, turgor normal. No suspicious lesions  Lymph nodes:   Cervical, supraclavicular, and inguinal nodes normal    Neurologic:   Normal strength, sensation and gait; reflexes 2+ and symmetric throughout                         Psych:  Normal mood, affect, hygiene and grooming     ASSESSMENT/PLAN:  Flu shot (No prevnar-20--should be 5 years from prevnar-13) Has he gotten COVID booster?  Shingrix or RSV? Due for Tdap from pharmacy, if he didn't already get    ?need alprazolam RF?  RF  lipitor after labs back Rest of meds good for over a month.   Recommended at least 30 minutes of aerobic activity at least 5 days/week, weight-bearing exercise at least 2x/week; proper sunscreen use reviewed; healthy diet and alcohol recommendations (less than or equal to 2 drinks/day) reviewed; regular seatbelt use; changing batteries in smoke detectors. Immunization recommendations discussed--continue high dose flu shots yearly COVID booster RSV vaccine Shingrix recommended, to get from pharmacy.  Tdap from pharmacy. Colonoscopy is due now, as 1 year f/u was recommended by surgeon due to low grade mucinous neoplasm of appendix  MOST form reviewed, filled out.  Full Code, Full Care He was reminded to complete paperwork for Living Will, Healthcare POA , and to get [place] copies when completed.    Medicare Attestation I have personally reviewed: The patient's medical and social history Their use of alcohol, tobacco or illicit drugs Their current medications and supplements The patient's functional ability including ADLs,fall risks, home safety risks, cognitive, and hearing and visual impairment Diet and physical activities Evidence for depression or mood disorders  The patient's weight, height, and BMI have been recorded in the chart.  I have made referrals, counseling, and provided education to the patient based on review of the above and I have provided the patient with a written personalized care plan for preventive services.     Lavonda Jumbo, MD

## 2023-10-11 NOTE — Patient Instructions (Incomplete)
HEALTH MAINTENANCE RECOMMENDATIONS:  It is recommended that you get at least 30 minutes of aerobic exercise at least 5 days/week (for weight loss, you may need as much as 60-90 minutes). This can be any activity that gets your heart rate up. This can be divided in 10-15 minute intervals if needed, but try and build up your endurance at least once a week.  Weight bearing exercise is also recommended twice weekly.  Eat a healthy diet with lots of vegetables, fruits and fiber.  "Colorful" foods have a lot of vitamins (ie green vegetables, tomatoes, red peppers, etc).  Limit sweet tea, regular sodas and alcoholic beverages, all of which has a lot of calories and sugar.  Up to 2 alcoholic drinks daily may be beneficial for men (unless trying to lose weight, watch sugars).  Drink a lot of water.  Sunscreen of at least SPF 30 should be used on all sun-exposed parts of the skin when outside between the hours of 10 am and 4 pm (not just when at beach or pool, but even with exercise, golf, tennis, and yard work!)  Use a sunscreen that says "broad spectrum" so it covers both UVA and UVB rays, and make sure to reapply every 1-2 hours.  Remember to change the batteries in your smoke detectors when changing your clock times in the spring and fall.  Carbon monoxide detectors are recommended for your home.  Use your seat belt every time you are in a car, and please drive safely and not be distracted with cell phones and texting while driving.    Jeremy Bush , Thank you for taking time to come for your Medicare Wellness Visit. I appreciate your ongoing commitment to your health goals. Please review the following plan we discussed and let me know if I can assist you in the future.   This is a list of the screening recommended for you and due dates:  Health Maintenance  Topic Date Due   Zoster (Shingles) Vaccine (1 of 2) 11/11/2003   DTaP/Tdap/Td vaccine (3 - Td or Tdap) 01/14/2023   Flu Shot  05/28/2023    COVID-19 Vaccine (5 - 2024-25 season) 10/24/2023*   Medicare Annual Wellness Visit  10/11/2024   Colon Cancer Screening  11/06/2027   Pneumonia Vaccine  Completed   Hepatitis C Screening  Completed   HPV Vaccine  Aged Out  *Topic was postponed. The date shown is not the original due date.    Please bring [place] copies of your Living Will and Healthcare Power of Attorney so that it can be scanned into your medical chart.  We discussed the following vaccines:  Tetanus booster (TdaP)--you need to get this from the pharmacy RSV vaccine is recommended from the pharmacy (this is a seasonal illness, get this first if you decide to get it this year, vs waiting until the Fall). These vaccines can be given same day or separated by 2 weeks from other vaccines.  I recommend getting the new shingles vaccine (Shingrix).You will need to get this from the pharmacy. This is a series of 2 injections, spaced 2 months apart.   This should be separated from other vaccines by at least 2 weeks.  Please contact Dr. Marge Duncans office regarding a 1 year follow-up colonoscopy (due to the low grade mucinous neoplasm of appendix found last year).  I encourage you to cut back on alcohol--either 1-2/night, or keep it limited to just 2-3 nights/week.  Work on finding something to replace the relaxation/comfort you  get from the wine (ie yoga, meditation). Think back to what worked at [facility].

## 2023-10-12 ENCOUNTER — Encounter: Payer: Self-pay | Admitting: Family Medicine

## 2023-10-12 ENCOUNTER — Ambulatory Visit: Payer: Medicare Other | Admitting: Family Medicine

## 2023-10-12 VITALS — BP 120/82 | HR 64 | Ht 70.0 in | Wt 178.0 lb

## 2023-10-12 DIAGNOSIS — I700 Atherosclerosis of aorta: Secondary | ICD-10-CM

## 2023-10-12 DIAGNOSIS — Z5181 Encounter for therapeutic drug level monitoring: Secondary | ICD-10-CM

## 2023-10-12 DIAGNOSIS — E7800 Pure hypercholesterolemia, unspecified: Secondary | ICD-10-CM

## 2023-10-12 DIAGNOSIS — R7301 Impaired fasting glucose: Secondary | ICD-10-CM

## 2023-10-12 DIAGNOSIS — F1010 Alcohol abuse, uncomplicated: Secondary | ICD-10-CM | POA: Diagnosis not present

## 2023-10-12 DIAGNOSIS — Z0000 Encounter for general adult medical examination without abnormal findings: Secondary | ICD-10-CM

## 2023-10-12 DIAGNOSIS — Z8546 Personal history of malignant neoplasm of prostate: Secondary | ICD-10-CM | POA: Diagnosis not present

## 2023-10-12 DIAGNOSIS — M109 Gout, unspecified: Secondary | ICD-10-CM

## 2023-10-12 DIAGNOSIS — D373 Neoplasm of uncertain behavior of appendix: Secondary | ICD-10-CM

## 2023-10-12 DIAGNOSIS — I10 Essential (primary) hypertension: Secondary | ICD-10-CM | POA: Diagnosis not present

## 2023-10-12 DIAGNOSIS — Z23 Encounter for immunization: Secondary | ICD-10-CM | POA: Diagnosis not present

## 2023-10-12 LAB — LIPID PANEL

## 2023-10-13 ENCOUNTER — Encounter: Payer: Self-pay | Admitting: Family Medicine

## 2023-10-13 LAB — CMP14+EGFR
ALT: 35 IU/L (ref 0–44)
AST: 31 IU/L (ref 0–40)
Albumin: 4.7 g/dL (ref 3.9–4.9)
Alkaline Phosphatase: 85 IU/L (ref 44–121)
BUN/Creatinine Ratio: 13 (ref 10–24)
BUN: 15 mg/dL (ref 8–27)
Bilirubin Total: 0.9 mg/dL (ref 0.0–1.2)
CO2: 22 mmol/L (ref 20–29)
Calcium: 9.9 mg/dL (ref 8.6–10.2)
Chloride: 99 mmol/L (ref 96–106)
Creatinine, Ser: 1.13 mg/dL (ref 0.76–1.27)
Globulin, Total: 2.6 g/dL (ref 1.5–4.5)
Glucose: 99 mg/dL (ref 70–99)
Potassium: 5.3 mmol/L — ABNORMAL HIGH (ref 3.5–5.2)
Sodium: 137 mmol/L (ref 134–144)
Total Protein: 7.3 g/dL (ref 6.0–8.5)
eGFR: 70 mL/min/{1.73_m2} (ref >59)

## 2023-10-13 LAB — LIPID PANEL
Cholesterol, Total: 202 mg/dL — ABNORMAL HIGH (ref 100–199)
HDL: 62 mg/dL (ref >39)
LDL CALC COMMENT:: 3.3 ratio (ref 0.0–5.0)
LDL Chol Calc (NIH): 123 mg/dL — ABNORMAL HIGH (ref 0–99)
Triglycerides: 96 mg/dL (ref 0–149)
VLDL Cholesterol Cal: 17 mg/dL (ref 5–40)

## 2023-10-13 LAB — CBC WITH DIFFERENTIAL/PLATELET
Basophils Absolute: 0.1 10*3/uL (ref 0.0–0.2)
Basos: 1 %
EOS (ABSOLUTE): 0 10*3/uL (ref 0.0–0.4)
Eos: 0 %
Hematocrit: 46.3 % (ref 37.5–51.0)
Hemoglobin: 15.1 g/dL (ref 13.0–17.7)
Immature Grans (Abs): 0 10*3/uL (ref 0.0–0.1)
Immature Granulocytes: 0 %
Lymphocytes Absolute: 2.2 10*3/uL (ref 0.7–3.1)
Lymphs: 22 %
MCH: 31.3 pg (ref 26.6–33.0)
MCHC: 32.6 g/dL (ref 31.5–35.7)
MCV: 96 fL (ref 79–97)
Monocytes Absolute: 1 10*3/uL — ABNORMAL HIGH (ref 0.1–0.9)
Monocytes: 10 %
Neutrophils Absolute: 6.7 10*3/uL (ref 1.4–7.0)
Neutrophils: 67 %
Platelets: 297 10*3/uL (ref 150–450)
RBC: 4.82 x10E6/uL (ref 4.14–5.80)
RDW: 12.4 % (ref 11.6–15.4)
WBC: 9.9 10*3/uL (ref 3.4–10.8)

## 2023-10-13 LAB — HEMOGLOBIN A1C
Est. average glucose Bld gHb Est-mCnc: 114 mg/dL
Hgb A1c MFr Bld: 5.6 % (ref 4.8–5.6)

## 2023-10-13 LAB — PSA

## 2023-10-13 LAB — URIC ACID: Uric Acid: 5.8 mg/dL (ref 3.8–8.4)

## 2023-10-13 MED ORDER — ATORVASTATIN CALCIUM 80 MG PO TABS: 80.0000 mg | ORAL_TABLET | Freq: Every day | ORAL | 0 refills | Status: DC

## 2023-10-15 ENCOUNTER — Encounter: Payer: Self-pay | Admitting: Family Medicine

## 2023-10-15 ENCOUNTER — Other Ambulatory Visit: Payer: Self-pay | Admitting: Family Medicine

## 2023-10-15 DIAGNOSIS — E7800 Pure hypercholesterolemia, unspecified: Secondary | ICD-10-CM

## 2023-10-15 DIAGNOSIS — F411 Generalized anxiety disorder: Secondary | ICD-10-CM

## 2023-10-15 MED ORDER — ALPRAZOLAM 0.5 MG PO TABS: ORAL_TABLET | ORAL | 0 refills | Status: DC

## 2023-10-15 NOTE — Telephone Encounter (Signed)
Atorvastatin was refilled yesterday

## 2023-11-06 ENCOUNTER — Other Ambulatory Visit: Payer: Self-pay | Admitting: Family Medicine

## 2023-11-06 DIAGNOSIS — M109 Gout, unspecified: Secondary | ICD-10-CM

## 2023-11-06 DIAGNOSIS — I10 Essential (primary) hypertension: Secondary | ICD-10-CM

## 2023-11-06 DIAGNOSIS — F418 Other specified anxiety disorders: Secondary | ICD-10-CM

## 2023-12-24 ENCOUNTER — Other Ambulatory Visit: Payer: Medicare Other

## 2024-01-12 ENCOUNTER — Other Ambulatory Visit: Payer: Medicare Other

## 2024-01-19 ENCOUNTER — Other Ambulatory Visit: Payer: Self-pay | Admitting: Family Medicine

## 2024-01-19 DIAGNOSIS — E7800 Pure hypercholesterolemia, unspecified: Secondary | ICD-10-CM

## 2024-01-26 ENCOUNTER — Other Ambulatory Visit: Payer: Self-pay | Admitting: Family Medicine

## 2024-01-26 DIAGNOSIS — F411 Generalized anxiety disorder: Secondary | ICD-10-CM

## 2024-01-26 NOTE — Telephone Encounter (Signed)
 Is this okay to refill?

## 2024-01-26 NOTE — Telephone Encounter (Signed)
 Refill done. Ensure that he is coming as scheduled, for fasting labs on 4/4.  This was due end of Feb, and has been rescheduled a few times He just got a 90d RF of the 80mg  atorvastatin. Need to make sure he comes for the f/u labs since that dose was increased in December. Thanks

## 2024-01-27 ENCOUNTER — Other Ambulatory Visit

## 2024-01-27 NOTE — Telephone Encounter (Signed)
 I left a message to let him know refill was sent and to remind him that he has a fasting lab visit on Tues 02/02/24 @ 8:45am for lipids/liver.

## 2024-01-29 ENCOUNTER — Encounter: Payer: Self-pay | Admitting: Family Medicine

## 2024-02-02 ENCOUNTER — Other Ambulatory Visit

## 2024-02-02 DIAGNOSIS — E7800 Pure hypercholesterolemia, unspecified: Secondary | ICD-10-CM

## 2024-02-02 DIAGNOSIS — Z5181 Encounter for therapeutic drug level monitoring: Secondary | ICD-10-CM

## 2024-02-03 ENCOUNTER — Encounter: Payer: Self-pay | Admitting: Family Medicine

## 2024-02-03 LAB — SPECIMEN STATUS REPORT

## 2024-02-03 LAB — LIPID PANEL
Chol/HDL Ratio: 2.8 ratio (ref 0.0–5.0)
Cholesterol, Total: 170 mg/dL (ref 100–199)
HDL: 60 mg/dL (ref >39)
LDL Chol Calc (NIH): 93 mg/dL (ref 0–99)
Triglycerides: 96 mg/dL (ref 0–149)
VLDL Cholesterol Cal: 17 mg/dL (ref 5–40)

## 2024-02-03 LAB — HEPATIC FUNCTION PANEL
ALT: 36 IU/L (ref 0–44)
AST: 29 IU/L (ref 0–40)
Albumin: 4.5 g/dL (ref 3.9–4.9)
Alkaline Phosphatase: 74 IU/L (ref 44–121)
Bilirubin Total: 0.8 mg/dL (ref 0.0–1.2)
Bilirubin, Direct: 0.25 mg/dL (ref 0.00–0.40)
Total Protein: 6.9 g/dL (ref 6.0–8.5)

## 2024-02-16 ENCOUNTER — Other Ambulatory Visit: Payer: Self-pay | Admitting: Family Medicine

## 2024-02-16 DIAGNOSIS — M109 Gout, unspecified: Secondary | ICD-10-CM

## 2024-02-16 DIAGNOSIS — I10 Essential (primary) hypertension: Secondary | ICD-10-CM

## 2024-02-16 DIAGNOSIS — F418 Other specified anxiety disorders: Secondary | ICD-10-CM

## 2024-03-09 LAB — HM COLONOSCOPY

## 2024-04-19 ENCOUNTER — Other Ambulatory Visit: Payer: Self-pay | Admitting: Family Medicine

## 2024-04-19 DIAGNOSIS — E7800 Pure hypercholesterolemia, unspecified: Secondary | ICD-10-CM

## 2024-05-17 ENCOUNTER — Other Ambulatory Visit: Payer: Self-pay | Admitting: Family Medicine

## 2024-05-17 DIAGNOSIS — F411 Generalized anxiety disorder: Secondary | ICD-10-CM

## 2024-05-17 NOTE — Telephone Encounter (Signed)
 Is this okay to refill?

## 2024-09-07 ENCOUNTER — Other Ambulatory Visit: Payer: Self-pay | Admitting: Family Medicine

## 2024-09-07 DIAGNOSIS — F411 Generalized anxiety disorder: Secondary | ICD-10-CM

## 2024-09-07 NOTE — Telephone Encounter (Signed)
 Is this okay to refill?

## 2024-10-31 ENCOUNTER — Ambulatory Visit: Payer: Medicare Other | Admitting: Family Medicine

## 2024-11-10 ENCOUNTER — Other Ambulatory Visit: Payer: Self-pay | Admitting: Family Medicine

## 2024-11-10 DIAGNOSIS — M109 Gout, unspecified: Secondary | ICD-10-CM

## 2024-11-10 DIAGNOSIS — F418 Other specified anxiety disorders: Secondary | ICD-10-CM

## 2024-11-10 DIAGNOSIS — I10 Essential (primary) hypertension: Secondary | ICD-10-CM

## 2024-12-12 ENCOUNTER — Ambulatory Visit: Admitting: Family Medicine
